# Patient Record
Sex: Male | Born: 1965 | Race: White | Hispanic: No | Marital: Single | Smoking: Never smoker
Health system: Southern US, Community
[De-identification: ages and names within clinical notes are randomized; demographics above are authoritative.]

## PROBLEM LIST (undated history)

## (undated) DIAGNOSIS — E119 Type 2 diabetes mellitus without complications: Secondary | ICD-10-CM

## (undated) DIAGNOSIS — F32A Depression, unspecified: Secondary | ICD-10-CM

## (undated) DIAGNOSIS — G40909 Epilepsy, unspecified, not intractable, without status epilepticus: Secondary | ICD-10-CM

## (undated) DIAGNOSIS — I10 Essential (primary) hypertension: Secondary | ICD-10-CM

## (undated) DIAGNOSIS — R270 Ataxia, unspecified: Secondary | ICD-10-CM

## (undated) DIAGNOSIS — F329 Major depressive disorder, single episode, unspecified: Secondary | ICD-10-CM

## (undated) DIAGNOSIS — J302 Other seasonal allergic rhinitis: Secondary | ICD-10-CM

## (undated) DIAGNOSIS — T190XXA Foreign body in urethra, initial encounter: Secondary | ICD-10-CM

## (undated) DIAGNOSIS — IMO0002 Reserved for concepts with insufficient information to code with codable children: Secondary | ICD-10-CM

## (undated) DIAGNOSIS — N390 Urinary tract infection, site not specified: Secondary | ICD-10-CM

## (undated) DIAGNOSIS — F89 Unspecified disorder of psychological development: Secondary | ICD-10-CM

## (undated) DIAGNOSIS — Q541 Hypospadias, penile: Secondary | ICD-10-CM

## (undated) HISTORY — DX: Other seasonal allergic rhinitis: J30.2

## (undated) HISTORY — PX: INGUINAL HERNIA REPAIR: SHX194

## (undated) HISTORY — DX: Hypospadias, penile: Q54.1

## (undated) HISTORY — DX: Unspecified disorder of psychological development: F89

## (undated) HISTORY — DX: Major depressive disorder, single episode, unspecified: F32.9

## (undated) HISTORY — DX: Ataxia, unspecified: R27.0

## (undated) HISTORY — DX: Type 2 diabetes mellitus without complications: E11.9

## (undated) HISTORY — DX: Reserved for concepts with insufficient information to code with codable children: IMO0002

## (undated) HISTORY — DX: Depression, unspecified: F32.A

## (undated) HISTORY — DX: Essential (primary) hypertension: I10

## (undated) HISTORY — DX: Urinary tract infection, site not specified: N39.0

## (undated) HISTORY — DX: Epilepsy, unspecified, not intractable, without status epilepticus: G40.909

## (undated) HISTORY — DX: Foreign body in urethra, initial encounter: T19.0XXA

---

## 1998-01-05 ENCOUNTER — Encounter (HOSPITAL_COMMUNITY): Admission: RE | Admit: 1998-01-05 | Discharge: 1998-04-05 | Payer: Self-pay | Admitting: Dentistry

## 1998-07-24 ENCOUNTER — Encounter (HOSPITAL_COMMUNITY): Admission: RE | Admit: 1998-07-24 | Discharge: 1998-10-22 | Payer: Self-pay | Admitting: Dentistry

## 2000-03-09 ENCOUNTER — Encounter (HOSPITAL_COMMUNITY): Admission: RE | Admit: 2000-03-09 | Discharge: 2000-06-07 | Payer: Self-pay | Admitting: Dentistry

## 2000-11-03 ENCOUNTER — Encounter (HOSPITAL_COMMUNITY): Admission: RE | Admit: 2000-11-03 | Discharge: 2001-02-01 | Payer: Self-pay | Admitting: Dentistry

## 2001-02-14 ENCOUNTER — Encounter (HOSPITAL_COMMUNITY): Admission: RE | Admit: 2001-02-14 | Discharge: 2001-05-15 | Payer: Self-pay | Admitting: Dentistry

## 2001-06-15 ENCOUNTER — Encounter (HOSPITAL_COMMUNITY): Admission: RE | Admit: 2001-06-15 | Discharge: 2001-09-13 | Payer: Self-pay | Admitting: Dentistry

## 2002-08-08 ENCOUNTER — Encounter: Admission: EM | Admit: 2002-08-08 | Discharge: 2002-08-08 | Payer: Self-pay | Admitting: Dentistry

## 2004-02-17 ENCOUNTER — Ambulatory Visit: Payer: Self-pay | Admitting: Gastroenterology

## 2004-02-27 ENCOUNTER — Ambulatory Visit: Payer: Self-pay | Admitting: Dentistry

## 2004-09-24 ENCOUNTER — Ambulatory Visit: Payer: Self-pay | Admitting: Dentistry

## 2004-11-01 ENCOUNTER — Ambulatory Visit: Payer: Self-pay | Admitting: Dentistry

## 2005-04-08 ENCOUNTER — Ambulatory Visit: Payer: Self-pay | Admitting: Dentistry

## 2005-06-08 ENCOUNTER — Ambulatory Visit: Payer: Self-pay | Admitting: Dentistry

## 2005-10-28 ENCOUNTER — Ambulatory Visit: Payer: Self-pay | Admitting: Dentistry

## 2006-01-19 ENCOUNTER — Inpatient Hospital Stay: Payer: Self-pay | Admitting: Internal Medicine

## 2006-05-11 ENCOUNTER — Emergency Department: Payer: Self-pay | Admitting: Unknown Physician Specialty

## 2006-06-15 ENCOUNTER — Emergency Department: Payer: Self-pay | Admitting: Emergency Medicine

## 2006-06-22 ENCOUNTER — Emergency Department: Payer: Self-pay | Admitting: Emergency Medicine

## 2006-06-23 ENCOUNTER — Ambulatory Visit: Payer: Self-pay | Admitting: Dentistry

## 2006-07-12 ENCOUNTER — Ambulatory Visit: Payer: Self-pay | Admitting: Urology

## 2006-08-03 ENCOUNTER — Ambulatory Visit: Payer: Self-pay | Admitting: Dentistry

## 2006-08-19 ENCOUNTER — Emergency Department: Payer: Self-pay | Admitting: Emergency Medicine

## 2006-08-19 ENCOUNTER — Other Ambulatory Visit: Payer: Self-pay

## 2007-01-19 ENCOUNTER — Ambulatory Visit: Payer: Self-pay | Admitting: Dentistry

## 2007-07-17 ENCOUNTER — Ambulatory Visit: Payer: Self-pay | Admitting: Dentistry

## 2007-08-26 ENCOUNTER — Emergency Department: Payer: Self-pay | Admitting: Emergency Medicine

## 2007-10-05 ENCOUNTER — Emergency Department: Payer: Self-pay | Admitting: Emergency Medicine

## 2008-02-05 ENCOUNTER — Ambulatory Visit: Payer: Self-pay | Admitting: Dentistry

## 2008-02-29 ENCOUNTER — Emergency Department: Payer: Self-pay | Admitting: Emergency Medicine

## 2008-08-29 ENCOUNTER — Ambulatory Visit: Payer: Self-pay | Admitting: Unknown Physician Specialty

## 2008-09-08 ENCOUNTER — Ambulatory Visit: Payer: Self-pay

## 2008-09-10 ENCOUNTER — Ambulatory Visit: Payer: Self-pay | Admitting: Dentistry

## 2008-10-17 ENCOUNTER — Ambulatory Visit: Payer: Self-pay | Admitting: Surgery

## 2008-10-24 ENCOUNTER — Ambulatory Visit: Payer: Self-pay | Admitting: Surgery

## 2009-04-01 ENCOUNTER — Ambulatory Visit: Payer: Self-pay | Admitting: Dentistry

## 2009-09-21 ENCOUNTER — Emergency Department: Payer: Self-pay | Admitting: Emergency Medicine

## 2009-11-03 ENCOUNTER — Ambulatory Visit: Payer: Self-pay | Admitting: Dentistry

## 2009-12-31 ENCOUNTER — Ambulatory Visit: Payer: Self-pay | Admitting: Urology

## 2010-07-27 DIAGNOSIS — K0401 Reversible pulpitis: Secondary | ICD-10-CM

## 2010-07-27 DIAGNOSIS — K029 Dental caries, unspecified: Secondary | ICD-10-CM

## 2010-08-24 ENCOUNTER — Ambulatory Visit: Payer: Self-pay | Admitting: Neurology

## 2010-08-25 ENCOUNTER — Ambulatory Visit: Payer: Self-pay | Admitting: Neurology

## 2010-08-27 ENCOUNTER — Ambulatory Visit: Payer: Self-pay | Admitting: Neurology

## 2010-09-13 DIAGNOSIS — K08409 Partial loss of teeth, unspecified cause, unspecified class: Secondary | ICD-10-CM

## 2010-09-13 DIAGNOSIS — K08109 Complete loss of teeth, unspecified cause, unspecified class: Secondary | ICD-10-CM

## 2010-09-13 DIAGNOSIS — K0820 Unspecified atrophy of edentulous alveolar ridge: Secondary | ICD-10-CM

## 2010-10-04 DIAGNOSIS — K08109 Complete loss of teeth, unspecified cause, unspecified class: Secondary | ICD-10-CM

## 2010-10-04 DIAGNOSIS — K08409 Partial loss of teeth, unspecified cause, unspecified class: Secondary | ICD-10-CM

## 2010-10-04 DIAGNOSIS — K117 Disturbances of salivary secretion: Secondary | ICD-10-CM

## 2010-10-13 ENCOUNTER — Encounter (HOSPITAL_COMMUNITY): Payer: Self-pay | Admitting: Dentistry

## 2010-10-13 DIAGNOSIS — K08409 Partial loss of teeth, unspecified cause, unspecified class: Secondary | ICD-10-CM

## 2010-10-13 DIAGNOSIS — K08109 Complete loss of teeth, unspecified cause, unspecified class: Secondary | ICD-10-CM

## 2010-10-17 ENCOUNTER — Inpatient Hospital Stay: Payer: Self-pay | Admitting: Internal Medicine

## 2010-11-10 ENCOUNTER — Encounter (HOSPITAL_COMMUNITY): Payer: Self-pay | Admitting: Dentistry

## 2010-11-10 DIAGNOSIS — G40909 Epilepsy, unspecified, not intractable, without status epilepticus: Secondary | ICD-10-CM

## 2010-11-10 DIAGNOSIS — F8189 Other developmental disorders of scholastic skills: Secondary | ICD-10-CM

## 2010-11-16 ENCOUNTER — Encounter (HOSPITAL_COMMUNITY): Payer: Self-pay | Admitting: Dentistry

## 2010-11-16 DIAGNOSIS — K1370 Unspecified lesions of oral mucosa: Secondary | ICD-10-CM

## 2010-12-01 DIAGNOSIS — K1370 Unspecified lesions of oral mucosa: Secondary | ICD-10-CM

## 2010-12-15 ENCOUNTER — Encounter (HOSPITAL_COMMUNITY): Payer: Self-pay | Admitting: Dentistry

## 2010-12-15 DIAGNOSIS — K5289 Other specified noninfective gastroenteritis and colitis: Secondary | ICD-10-CM

## 2011-03-08 ENCOUNTER — Encounter (HOSPITAL_COMMUNITY): Payer: Self-pay | Admitting: Dentistry

## 2011-03-08 ENCOUNTER — Ambulatory Visit (HOSPITAL_COMMUNITY): Payer: Medicaid - Dental | Admitting: Dentistry

## 2011-03-08 VITALS — BP 104/72 | HR 83 | Temp 97.0°F

## 2011-03-08 DIAGNOSIS — K036 Deposits [accretions] on teeth: Secondary | ICD-10-CM

## 2011-03-08 DIAGNOSIS — Z463 Encounter for fitting and adjustment of dental prosthetic device: Secondary | ICD-10-CM

## 2011-03-08 DIAGNOSIS — K0530 Chronic periodontitis, unspecified: Secondary | ICD-10-CM

## 2011-03-08 DIAGNOSIS — K056 Periodontal disease, unspecified: Secondary | ICD-10-CM

## 2011-03-08 DIAGNOSIS — K117 Disturbances of salivary secretion: Secondary | ICD-10-CM

## 2011-03-08 NOTE — Progress Notes (Signed)
BP 104/72  Pulse 83  Temp(Src) 97 F (36.1 C) (Oral)  Ricard Faulkner is a 45 year old male that presents for periodic oral exam. Premedication: None required Medical Hx Update:  Past Medical History  Diagnosis Date  . Developmental disability   . Seizure disorder   . Urogenital disorder     Multiple surgeries; H/O Undescended teticle; nocturnal enuresis  . Ataxia     Demyelinating motor neuropathy  . Hypertension   . Diabetes mellitus   . Depression     History of depression  . Seasonal allergies   . ALLERGIES/ADVERSE DRUG REACTIONS: Allergies  Allergen Reactions  . Zithromax (Azithromycin)    MEDICATIONS: Current outpatient prescriptions:baclofen (LIORESAL) 10 MG tablet, Take 10 mg by mouth 3 (three) times daily.  , Disp: , Rfl: ;  divalproex (DEPAKOTE) 500 MG DR tablet, Take 500 mg by mouth 2 (two) times daily.  , Disp: , Rfl: ;  ferrous sulfate 325 (65 FE) MG EC tablet, Take 325 mg by mouth daily.  , Disp: , Rfl: ;  loratadine (CLARITIN) 10 MG tablet, Take 10 mg by mouth daily.  , Disp: , Rfl:  paliperidone (INVEGA) 3 MG 24 hr tablet, Take 3 mg by mouth every morning.  , Disp: , Rfl: ;  PARoxetine (PAXIL-CR) 12.5 MG 24 hr tablet, Take 12.5 mg by mouth every morning.  , Disp: , Rfl:   C/C: Patient presents for periodic oral examination and a dental cleaning. HPI:  Patient last seen for insertion of mandibular partial denture. Patient now presents for every 6 month exam and cleaning as indicated. Patient denies any acute dental problems.  DENTAL EXAM: General: Patient is a developmentally disabled male in no acute distress. Vitals: As above. Extraoral Exam: Is no palpable lymphadenopathy. There are no TMJ symptoms. Intraoral  Exam: Patient has excessive saliva. There are no Soft tissue lesions. Dentition: Patient with tooth numbers 18 1930 and 31 remaining. Caries: No dental caries noted today. Endodontic: No acute pulpitits symptoms.  C&B: Patient has crown the tooth  numbers 19 and 30 with no obvious problems. Prosthodontic: Patient has an upper complete denture which is less than ideal with retention stability. Been able a partial dentures acceptable. Father is interested in possible new upper complete denture at this time. We'll need to obtain prior approval from [facility] before this procedure can be done. Occlusion: Poor occlusal scheme of a stable occlusion. Radiographic Interpretation: No radiographs taken today  Procedures: 1. Periodic oral exam 2. Adult prophylaxis with sonic scaler and selective hand curettes. [place]. Application of fluoride varnish. 3. Provided a prescription with instructions on use of super poligrip denture powder adhesive.  Plan:  1. Will proceed with dental cleaning today. 2. We'll proceed with obtaining prior approval from [facility] for a new upper complete denture. 3. Will call patient for new appointment once prior approval has been received. 4. Call if problems arise before next scheduled appointment.    Charlynne Pander 03/08/2011

## 2011-04-07 ENCOUNTER — Ambulatory Visit: Payer: Self-pay | Admitting: Unknown Physician Specialty

## 2011-05-10 ENCOUNTER — Ambulatory Visit (HOSPITAL_COMMUNITY): Payer: Medicaid - Dental | Admitting: Dentistry

## 2011-05-10 VITALS — BP 102/67 | HR 86 | Temp 97.4°F

## 2011-05-10 DIAGNOSIS — K000 Anodontia: Secondary | ICD-10-CM

## 2011-05-10 DIAGNOSIS — Z463 Encounter for fitting and adjustment of dental prosthetic device: Secondary | ICD-10-CM

## 2011-05-10 DIAGNOSIS — K0820 Unspecified atrophy of edentulous alveolar ridge: Secondary | ICD-10-CM

## 2011-05-10 DIAGNOSIS — K117 Disturbances of salivary secretion: Secondary | ICD-10-CM

## 2011-05-10 DIAGNOSIS — K08109 Complete loss of teeth, unspecified cause, unspecified class: Secondary | ICD-10-CM

## 2011-05-10 NOTE — Progress Notes (Signed)
Tuesday, May 10, 2011   BP: 102/67             P:  86             T: 97.0   Daniel Mitchell presents for start of upper complete denture. Exam: Patient has an edentulous maxilla and lower partial denture. 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  Discussed procedures involved in upper complete denture with father and caregiver and the prognosis for successful ability to wear dentures. Price for denture confirmed. Patient and father agree to proceed with upper complete denture fabrication. Procedures: Upper complete primary impressions in alginate. Pick up impression of lower partial. Lab pour. To [facility] for upper complete custom tray fabrication. RTC for upper denture final impression.  Charlynne Pander, DDS

## 2011-05-18 ENCOUNTER — Ambulatory Visit (HOSPITAL_COMMUNITY): Payer: Medicaid - Dental | Admitting: Dentistry

## 2011-05-18 DIAGNOSIS — K117 Disturbances of salivary secretion: Secondary | ICD-10-CM

## 2011-05-18 DIAGNOSIS — K000 Anodontia: Secondary | ICD-10-CM

## 2011-05-18 NOTE — Progress Notes (Signed)
Wednesday, May 18, 2011  BP: 108/74              P: 93            T: 98.6  [name] presents for continued upper complete denture fabrication. Procedure: Upper complete denture border molding and final impression in [place]. Difficult secondary to copious saliva. Patient tolerated procedure well. To [facility] for custom baseplate with rim. Return to clinic for upper complete denture jaw relations on 1/31.13 at 11:00am.   Dr. Cindra Eves

## 2011-05-26 ENCOUNTER — Ambulatory Visit (HOSPITAL_COMMUNITY): Payer: Medicaid - Dental | Admitting: Dentistry

## 2011-05-26 VITALS — BP 116/64 | HR 70 | Temp 97.6°F

## 2011-05-26 DIAGNOSIS — K08109 Complete loss of teeth, unspecified cause, unspecified class: Secondary | ICD-10-CM

## 2011-05-26 DIAGNOSIS — Z463 Encounter for fitting and adjustment of dental prosthetic device: Secondary | ICD-10-CM

## 2011-05-26 DIAGNOSIS — M264 Malocclusion, unspecified: Secondary | ICD-10-CM

## 2011-05-26 DIAGNOSIS — K000 Anodontia: Secondary | ICD-10-CM

## 2011-05-26 DIAGNOSIS — K117 Disturbances of salivary secretion: Secondary | ICD-10-CM

## 2011-05-26 NOTE — Progress Notes (Signed)
Thursday, May 26, 2011   BP: 116/64              P:  70              T: 97.6   Daniel Mitchell presents for continued upper complete denture fabrication. Procedure: Upper complete denture Jaw relations with bite registration. Difficult procedure secondary to jaw size discrepancy and excessive saliva. Tooth selection as before from previous upper denture. Portrait A2 shade.  Discussed matching upper denture to existing lower partial denture with Father and caregiver.  Discussed trying posterior crossbite for upper set up versus maxillary posterior setup from previous denture. Patient tolerated procedure well. Return to clinic for upper complete denture wax tryin.   Dr. Cindra Eves

## 2011-06-09 ENCOUNTER — Ambulatory Visit (HOSPITAL_COMMUNITY): Payer: Medicaid - Dental | Admitting: Dentistry

## 2011-06-09 VITALS — BP 121/86 | HR 101 | Temp 97.5°F

## 2011-06-09 DIAGNOSIS — Z463 Encounter for fitting and adjustment of dental prosthetic device: Secondary | ICD-10-CM

## 2011-06-09 DIAGNOSIS — K08199 Complete loss of teeth due to other specified cause, unspecified class: Secondary | ICD-10-CM

## 2011-06-09 DIAGNOSIS — M264 Malocclusion, unspecified: Secondary | ICD-10-CM

## 2011-06-09 NOTE — Progress Notes (Signed)
Thursday, June 09, 2011   BP: 121/86           P: 101            T: 97.5   Daniel Mitchell presents for continued upper complete denture fabrication. Procedure: Upper denture wax tryin. Patient and Father accept esthetics, phonetics, fit and function. Patient and father agrees to process "as is" in [place] [address]. Pt. to return to clinic for upper complete denture insertion.   Dr. Cindra Eves

## 2011-06-22 ENCOUNTER — Ambulatory Visit (HOSPITAL_COMMUNITY): Payer: Medicaid - Dental | Admitting: Dentistry

## 2011-06-22 ENCOUNTER — Encounter (HOSPITAL_COMMUNITY): Payer: Self-pay | Admitting: Dentistry

## 2011-06-22 VITALS — BP 106/74 | HR 76 | Temp 97.0°F

## 2011-06-22 DIAGNOSIS — K000 Anodontia: Secondary | ICD-10-CM

## 2011-06-22 DIAGNOSIS — K1370 Unspecified lesions of oral mucosa: Secondary | ICD-10-CM

## 2011-06-22 DIAGNOSIS — Z463 Encounter for fitting and adjustment of dental prosthetic device: Secondary | ICD-10-CM

## 2011-06-22 DIAGNOSIS — K117 Disturbances of salivary secretion: Secondary | ICD-10-CM

## 2011-06-22 DIAGNOSIS — M264 Malocclusion, unspecified: Secondary | ICD-10-CM

## 2011-06-22 NOTE — Progress Notes (Signed)
Wednesday, June 22, 2011   BP: 106/74               P: 76                T:97.0  [name] presents for insertion of upper complete denture. Procedure: Pressure indicating paste applied to upper denture. Adjustments made as needed. [place]. Occlusion evaluated and adjustments made as needed for centric relation and protrusive strokes. Good esthetics, phonetics, fit, and function noted. Patient and Mother accept results. Postop instructions were provided in a written and verbal format on the use and care of the denture. Patient to keep denture out if sore spots develop. Use salt water rinses as needed to aid healing. RTC as scheduled for denture adjustment. Call if problems arise before then. Pt. dismissed in stable condition.   Dr. Cindra Eves

## 2011-06-22 NOTE — Patient Instructions (Signed)
Instructions for Denture Use and Care  Congratulations, you are on the way to oral rehabilitation!  You have just received a new set of complete or partial dentures.  These prostheses will help to improve both your appearance and chewing ability.  These instructions will help you get adjusted to your dentures as well as care for them properly.  Please read these instructions carefully and completely as soon as you get home.  If you or your caregiver have any questions please notify the [facility] at [phone].  HOW YOUR DENTURES LOOK AND FEEL Soon after you begin wearing your dentures, you may feel that your dentures are too large or even loose.  As our mouth and facial muscles become accustomed to the dentures, these feelings will go away.  You also may feel that you are salivating more than you normally do.  This feeling should go away as you get used to having the dentures in your mouth.  You may bite your cheek or your tongue; this will eventually resolve itself as you wear your dentures.  Some soreness is to be expected, but you should not hurt.  If your mouth hurts, call your dentist.  A denture adhesive may occasionally be necessary to hold your dentures in place more securely.  The dentist will let you know when one is recommended for you.  SPEAKING Wearing dentures will change the sound of your voice initially.  This will be noticed by you more than anyone else.  Bite and swallow before you speak, in order to place your dentures in position so that you may speak more clearly.  Practice speaking by reading aloud or counting from 1 to 100 very slowly and distinctly.  After some practice your mouth will become accustomed to your dentures and you will speak more clearly.  EATING Chewing will definitely be different after you receive your dentures.  With a little practice and patience you should be able to eat just about any kind of food.  Begin by eating small quantities of food  that are cut into small pieces.  Star with soft foods such as eggs, cooked vegetables, or puddings.  As you gain confidence advance  Your diet to whatever texture foods you can tolerate.  DENTURE CARE Dentures can collect plaque and calculus much the same as natural teeth can.  If not removed on a regular basis, your dentures will not look or feel clean, and you will experience denture odor.  It is very important that you remove your dentures at bedtime and clean them thoroughly.  You should: 1. Clean your dentures over a sink full of water so if dropped, breakage will be prevented. 2. Rinse your dentures with cool water to remove any large food particles. 3. Use soap and water or a denture cleanser or paste to clean the dentures.  Do not use regular toothpaste as it may abrade the denture base or teeth. 4. Use a moistened denture brush to clean all surfaces (inside and outside). 5. Rinse thoroughly to remove any remaining soap or denture cleanser. 6. Use a soft bristle toothbrush to gently brush any natural teeth, gums, tongue, and palate at bedtime and before reinserting your dentures. 7. Do not sleep with your dentures in your mouth at night.  Remove your dentures and soak them overnight in a denture cup filled with water or denture solution as recommended by your dentist.  This routine will become second nature and will increase the life and comfort   of your dentures.  Please do not try to adjust these dentures yourself; you could damage them.  FOLLOW-UP You should call or make an appointment with your dentist.  Your dentist would like to see you at least once a year for a check-up and examination. 

## 2011-06-27 ENCOUNTER — Encounter (HOSPITAL_COMMUNITY): Payer: Self-pay | Admitting: Dentistry

## 2011-06-27 ENCOUNTER — Ambulatory Visit (HOSPITAL_COMMUNITY): Payer: Medicaid - Dental | Admitting: Dentistry

## 2011-06-27 VITALS — BP 98/64 | HR 63 | Temp 97.6°F

## 2011-06-27 DIAGNOSIS — K1370 Unspecified lesions of oral mucosa: Secondary | ICD-10-CM

## 2011-06-27 DIAGNOSIS — K117 Disturbances of salivary secretion: Secondary | ICD-10-CM

## 2011-06-27 DIAGNOSIS — K08109 Complete loss of teeth, unspecified cause, unspecified class: Secondary | ICD-10-CM

## 2011-06-27 DIAGNOSIS — K062 Gingival and edentulous alveolar ridge lesions associated with trauma: Secondary | ICD-10-CM

## 2011-06-27 DIAGNOSIS — K000 Anodontia: Secondary | ICD-10-CM

## 2011-06-27 NOTE — Progress Notes (Signed)
Monday, June 27, 2011  BP: 96/64            P: 63                T: 97.6  Daniel Mitchell presents for evaluation of recently inserted upper complete denture. Subjective: Patient had been complaining to his caregivers of upper left denture irritation over the weekend. Exam: No sign of denture irritation or erythema. Sialorrhea as before. Excessive denture adhesive in upper denture today. Instructions provided to caregivers on lessening amount of adhesive placed into dentures.  Procedure: Pressure indicating paste applied to upper denture. Adjustments made as needed. [place]. Occlusion evaluated and adjustments made as needed for centric relation. Patient accepts results and denies any problems with his denture today. Patient to leet caregivers or parents know if denture problems arise. Patient to keep denture out if sore spots develop. Use salt water rinses as needed to aid healing. RTC as scheduled for denture adjustment. Call if problems arise before then. Patient dismissed in stable condition.  Dr. Cindra Eves

## 2011-09-06 ENCOUNTER — Ambulatory Visit (HOSPITAL_COMMUNITY): Payer: Medicaid - Dental | Admitting: Dentistry

## 2011-09-06 ENCOUNTER — Encounter (HOSPITAL_COMMUNITY): Payer: Self-pay | Admitting: Dentistry

## 2011-09-06 DIAGNOSIS — K0530 Chronic periodontitis, unspecified: Secondary | ICD-10-CM

## 2011-09-06 DIAGNOSIS — Z463 Encounter for fitting and adjustment of dental prosthetic device: Secondary | ICD-10-CM

## 2011-09-06 DIAGNOSIS — K1370 Unspecified lesions of oral mucosa: Secondary | ICD-10-CM

## 2011-09-06 DIAGNOSIS — K062 Gingival and edentulous alveolar ridge lesions associated with trauma: Secondary | ICD-10-CM

## 2011-09-06 DIAGNOSIS — K036 Deposits [accretions] on teeth: Secondary | ICD-10-CM

## 2011-09-06 DIAGNOSIS — K08109 Complete loss of teeth, unspecified cause, unspecified class: Secondary | ICD-10-CM

## 2011-09-06 NOTE — Progress Notes (Signed)
09/06/2011  Patient:            Daniel Mitchell Date of Birth:  25-Aug-1965 MRN:                [id number]  BP 106/70  Pulse 70  Temp(Src) 97.4 F (36.3 C) (Oral)  [name] is a 46 year old male that presents for periodic oral exam and cleaning. Premedication: None required  Medical Hx Update:  Past Medical History  Diagnosis Date  . Developmental disability   . Seizure disorder   . Urogenital disorder     Multiple surgeries; H/O Undescended teticle; nocturnal enuresis  . Ataxia     Demyelinating motor neuropathy  . Hypertension   . Diabetes mellitus   . Depression     History of depression  . Seasonal allergies   . ALLERGIES/ADVERSE DRUG REACTIONS: Allergies  Allergen Reactions  . Zithromax (Azithromycin)    MEDICATIONS: Current Outpatient Prescriptions  Medication Sig Dispense Refill  . baclofen (LIORESAL) 10 MG tablet Take 10 mg by mouth 3 (three) times daily.        . divalproex (DEPAKOTE) 500 MG DR tablet Take 500 mg by mouth. Take one tablet every morning and two tablets every evening      . ferrous sulfate 325 (65 FE) MG EC tablet Take 325 mg by mouth daily.        Marland Kitchen nystatin-triamcinolone (MYCOLOG II) cream Apply 1 application topically 3 (three) times daily.      . paliperidone (INVEGA) 3 MG 24 hr tablet Take 3 mg by mouth every morning.        Marland Kitchen PARoxetine (PAXIL-CR) 12.5 MG 24 hr tablet Take 12.5 mg by mouth every morning.        . loratadine (CLARITIN) 10 MG tablet Take 10 mg by mouth daily.         C/C: Patient presents for periodic oral examination and a dental cleaning. Patient is complaining of non-specific discomfort to lower right molar.  HPI:  Patient was last seen for and adjustment of an upper complete denture and lower partial denture.  Patient reporting having some discomfort with the lower right molar proximal 0.2 weeks ago. There is no spontaneous pain. There is no history of acute pulpitis symptoms. Patient indicates that there is  some tenderness with removing the lower denture.  DENTAL EXAM: General: Patient is a developmentally disabled male in no acute distress. Extraoral Exam: There is NO palpable lymphadenopathy. There are no TMJ symptoms. Intraoral  Exam: Patient has excessive saliva. There is some denture irritation note to the lingual aspect #20/21 area. Dentition: Patient with tooth numbers 18, 19, 30, 31 remaining. Periodontal: Patient has chronic periodontitis with plaque and calculus accumulations, generalized gingival recession and no obvious tooth mobility. Patient has increased gingival recession on the lingual aspect of tooth #30.  Oral hygiene instructions were provided on keeping this area clean. Adult prophylaxis provided with a sonic scaler and selective hand curettes. Areas were polished. Fluoride varnish was applied to the remaining teeth. Caries: No dental caries noted today. Endodontic: No acute pulpitits symptoms. Periapical radiographs taken of #30/31. No obvious periapical pathology noted. Previous Root canal therapy #30 noted. C&B: Patient has crown the tooth numbers 19 and 30 with no obvious problems. Prosthodontic: Patient has an upper complete denture and a lower Flexite mandibular partial denture.  Pressure indicating paste was applied to dentures and adjustments made as needed. Dentures were polished appropriately. No significant problems were associated with the lower  right molar #30/31 area from the denture.  Occlusion: The occlusion of the dentures appears to be acceptable at this time. Radiographic Interpretation:  Periapical radiograph #30/31 taken. No obvious radiolucency noted. Previos root canal therapy #30 noted. No caries noted.  Assessments: 1. Chronic periodontitis 2. Accretions 3. Denture irritation 4. Gingival recession especially around lingual of #30 5. Acceptable upper complete and lower partial dentures.  Procedures: 1. Periodic oral exam 2. PA #30/31 with no  obvious periapical pathology 3. Adult prophylaxis with sonic scaler and selective hand curettes. [place]. Application of fluoride varnish. 4. Adjustment of upper complete and lower partial dentures. [place].   Plan:  1. Patient and cargivers are to brush teeth at least twice daily with fluoride toothpaste. Dentures are to be cleaned with soap and water and then soaked overnight with the appropriate denture cleanser.     Patient's mouth is to be cleaned to remove residual denture adhesive at night. 2. Return to clinic for periodic oral examination and dental cleaning in 6 months.  3. Caregiver is to call if problems arise before the next scheduled appointment.   Charlynne Pander, DDS

## 2012-01-23 ENCOUNTER — Emergency Department: Payer: Self-pay

## 2012-01-23 LAB — URINALYSIS, COMPLETE
Bilirubin,UR: NEGATIVE
Glucose,UR: NEGATIVE mg/dL (ref 0–75)
Ph: 7 (ref 4.5–8.0)
RBC,UR: 3 /HPF (ref 0–5)
Squamous Epithelial: 2
WBC UR: 16 /HPF (ref 0–5)

## 2012-01-23 LAB — COMPREHENSIVE METABOLIC PANEL
Albumin: 3.6 g/dL (ref 3.4–5.0)
Alkaline Phosphatase: 61 U/L (ref 50–136)
Anion Gap: 10 (ref 7–16)
BUN: 8 mg/dL (ref 7–18)
Calcium, Total: 9.5 mg/dL (ref 8.5–10.1)
Co2: 29 mmol/L (ref 21–32)
Creatinine: 0.82 mg/dL (ref 0.60–1.30)
EGFR (African American): >60
Glucose: 125 mg/dL — ABNORMAL HIGH (ref 65–99)
Potassium: 4.3 mmol/L (ref 3.5–5.1)
SGOT(AST): 36 U/L (ref 15–37)
SGPT (ALT): 49 U/L (ref 12–78)
Sodium: 140 mmol/L (ref 136–145)
Total Protein: 7.5 g/dL (ref 6.4–8.2)

## 2012-01-23 LAB — CBC
HCT: 41.2 % (ref 40.0–52.0)
MCH: 31.5 pg (ref 26.0–34.0)
MCHC: 33.6 g/dL (ref 32.0–36.0)
MCV: 94 fL (ref 80–100)
Platelet: 155 10*3/uL (ref 150–440)
RBC: 4.4 10*6/uL (ref 4.40–5.90)
WBC: 6.8 10*3/uL (ref 3.8–10.6)

## 2012-03-13 ENCOUNTER — Ambulatory Visit (HOSPITAL_COMMUNITY): Payer: Medicaid - Dental | Admitting: Dentistry

## 2012-03-13 ENCOUNTER — Encounter (HOSPITAL_COMMUNITY): Payer: Self-pay | Admitting: Dentistry

## 2012-03-13 VITALS — BP 91/62 | HR 78 | Temp 98.4°F

## 2012-03-13 DIAGNOSIS — IMO0002 Reserved for concepts with insufficient information to code with codable children: Secondary | ICD-10-CM

## 2012-03-13 DIAGNOSIS — K036 Deposits [accretions] on teeth: Secondary | ICD-10-CM

## 2012-03-13 DIAGNOSIS — K0530 Chronic periodontitis, unspecified: Secondary | ICD-10-CM

## 2012-03-13 DIAGNOSIS — K0820 Unspecified atrophy of edentulous alveolar ridge: Secondary | ICD-10-CM

## 2012-03-13 DIAGNOSIS — K117 Disturbances of salivary secretion: Secondary | ICD-10-CM

## 2012-03-13 NOTE — Patient Instructions (Addendum)
   Plan:  1. Patient and cargivers are to brush teeth at least twice daily with fluoride toothpaste. Dentures are to be cleaned with soap and water and then soaked overnight with the appropriate denture cleanser.     Patient's mouth is to be cleaned to remove residual denture adhesive at night. 2. Return to clinic for periodic oral examination and dental cleaning in 6 months.  3. Caregiver is to call if problems arise before the next scheduled appointment.

## 2012-03-13 NOTE — Progress Notes (Signed)
03/13/2012  Patient:            Daniel Mitchell Date of Birth:  Mar 29, 1966 MRN:                [id number]  BP 91/62  Pulse 78  Temp 98.4 F (36.9 C) (Oral)  [name] is a 46 year old male that presents for periodic oral exam and cleaning. Premedication: None required  Medical Hx Update:  Past Medical History  Diagnosis Date  . Developmental disability   . Seizure disorder   . Urogenital disorder     Multiple surgeries; H/O Undescended teticle; nocturnal enuresis  . Ataxia     Demyelinating motor neuropathy  . Hypertension   . Diabetes mellitus   . Depression     History of depression  . Seasonal allergies   . ALLERGIES/ADVERSE DRUG REACTIONS: Allergies  Allergen Reactions  . Zithromax (Azithromycin)    MEDICATIONS: Current Outpatient Prescriptions  Medication Sig Dispense Refill  . baclofen (LIORESAL) 10 MG tablet Take 10 mg by mouth 3 (three) times daily.        . divalproex (DEPAKOTE) 500 MG DR tablet Take 500 mg by mouth. Take one tablet every morning and two tablets every evening      . ferrous sulfate 325 (65 FE) MG EC tablet Take 325 mg by mouth daily.        Marland Kitchen loratadine (CLARITIN) 10 MG tablet Take 10 mg by mouth daily as needed.       . nystatin-triamcinolone (MYCOLOG II) cream Apply 1 application topically 3 (three) times daily.      . paliperidone (INVEGA) 3 MG 24 hr tablet Take 3 mg by mouth every morning.        Marland Kitchen PARoxetine (PAXIL-CR) 12.5 MG 24 hr tablet Take 12.5 mg by mouth every morning.         C/C: Patient presents for periodic oral examination and a dental cleaning.  HPI:  Patient was last seen for exam and cleaning and adjustment of an upper complete denture and lower partial denture in May 2013.  Patient is not complaining of any dental pain. Upper denture is somewhat loose consistent with atrophy of dentulous alveolar ridge.  DENTAL EXAM: General: Patient is a developmentally disabled male in no acute distress. Extraoral Exam:  There is NO palpable lymphadenopathy. There are no TMJ symptoms. Intraoral  Exam: Patient has excessive saliva. There is no denture irritation noted. There is gingival recession associated with lower right molar on lingual #30. Dentition: Patient with tooth numbers 18, 19, 30, 31 remaining. Periodontal: Patient has chronic periodontitis with plaque and calculus accumulations, generalized gingival recession and no obvious tooth mobility. Patient has gingival recession on the lingual aspect of tooth #30.  No sensitivity reported today. Oral hygiene instructions were provided on keeping this area clean. Adult prophylaxis provided with a sonic scaler and selective hand curettes. Areas were polished. Fluoride varnish was applied to the remaining teeth. Caries: No dental caries noted today. Endodontic: No acute pulpitits symptoms.  C&B: Patient has crown the tooth numbers 19 and 30 with no obvious problems. Prosthodontic: Patient has an upper complete denture and a lower Flexite mandibular partial denture.  Lower partial denture was polished. Occlusion: The occlusion of the dentures appears to be acceptable at this time.  Assessments: 1. Chronic periodontitis 2. Accretions 3. Gingival recession especially around lingual of #30 4. Acceptable upper complete and lower partial dentures. 5. Atrophy of edentulous alveolar ridges  Procedures: 1. Periodic oral  exam 2. Adult prophylaxis with sonic scaler and selective hand curettes. [place]. Application of fluoride varnish. 3. Evaluation of upper complete and lower partial dentures. [place].   Plan:  1. Patient and cargivers are to brush teeth at least twice daily with fluoride toothpaste. Dentures are to be cleaned with soap and water and then soaked overnight with the appropriate denture cleanser.     Patient's mouth is to be cleaned to remove residual denture adhesive at night. 2. Return to clinic for periodic oral examination and dental cleaning in 6  months.  3. Caregiver is to call if problems arise before the next scheduled appointment.   Charlynne Pander, DDS

## 2012-08-21 ENCOUNTER — Ambulatory Visit: Payer: Self-pay | Admitting: Neurology

## 2012-09-11 ENCOUNTER — Ambulatory Visit: Payer: Self-pay | Admitting: Physical Medicine and Rehabilitation

## 2012-09-25 ENCOUNTER — Other Ambulatory Visit (HOSPITAL_COMMUNITY): Payer: Self-pay | Admitting: Dentistry

## 2012-09-25 ENCOUNTER — Ambulatory Visit (HOSPITAL_COMMUNITY): Payer: Medicaid - Dental | Admitting: Dentistry

## 2012-09-25 ENCOUNTER — Encounter (HOSPITAL_COMMUNITY): Payer: Self-pay | Admitting: Dentistry

## 2012-09-25 VITALS — BP 110/73 | HR 84 | Temp 97.8°F | Ht 62.0 in | Wt 165.0 lb

## 2012-09-25 DIAGNOSIS — IMO0002 Reserved for concepts with insufficient information to code with codable children: Secondary | ICD-10-CM

## 2012-09-25 DIAGNOSIS — Z98811 Dental restoration status: Secondary | ICD-10-CM

## 2012-09-25 DIAGNOSIS — M264 Malocclusion, unspecified: Secondary | ICD-10-CM

## 2012-09-25 DIAGNOSIS — K0530 Chronic periodontitis, unspecified: Secondary | ICD-10-CM | POA: Insufficient documentation

## 2012-09-25 DIAGNOSIS — K117 Disturbances of salivary secretion: Secondary | ICD-10-CM

## 2012-09-25 DIAGNOSIS — K0820 Unspecified atrophy of edentulous alveolar ridge: Secondary | ICD-10-CM

## 2012-09-25 DIAGNOSIS — K036 Deposits [accretions] on teeth: Secondary | ICD-10-CM

## 2012-09-25 DIAGNOSIS — K045 Chronic apical periodontitis: Secondary | ICD-10-CM

## 2012-09-25 DIAGNOSIS — K08409 Partial loss of teeth, unspecified cause, unspecified class: Secondary | ICD-10-CM

## 2012-09-25 NOTE — Patient Instructions (Addendum)
Plan:  1. Patient and cargivers are to brush teeth at least twice daily with fluoride toothpaste. Dentures are to be cleaned with soap and water and then soaked overnight with the appropriate denture cleanser.     Patient's mouth is to be cleaned to remove residual denture adhesive at night. 2. Return to clinic for periodic oral examination and dental cleaning in 6 months.  3. Caregiver is to call if problems arise before the next scheduled appointment.    Charlynne Pander, DDS [phone]

## 2012-09-25 NOTE — Progress Notes (Addendum)
09/25/2012  Patient:            Navdeep Halt Date of Birth:  10/23/1965 MRN:                [id number]  BP 110/73  Pulse 84  Temp(Src) 97.8 F (36.6 C) (Oral)  Ht 5\' 2"  (1.575 m)  Wt 165 lb (74.844 kg)  BMI 30.17 kg/m2  Zohair Brain Stavely is a 47 year old male that presents for periodic oral exam and cleaning. Premedication: None required  Medical Hx Update:  Past Medical History  Diagnosis Date  . Developmental disability   . Seizure disorder   . Urogenital disorder     Multiple surgeries; H/O Undescended teticle; nocturnal enuresis  . Ataxia     Demyelinating motor neuropathy  . Hypertension   . Diabetes mellitus   . Depression     History of depression  . Seasonal allergies   . ALLERGIES/ADVERSE DRUG REACTIONS: Allergies  Allergen Reactions  . Zithromax (Azithromycin)    MEDICATIONS: Current Outpatient Prescriptions  Medication Sig Dispense Refill  . baclofen (LIORESAL) 10 MG tablet Take 10 mg by mouth 3 (three) times daily.        . clotrimazole (LOTRIMIN) 1 % cream Apply topically. Apply to feet once daily as needed      . divalproex (DEPAKOTE) 500 MG DR tablet Take 500 mg by mouth. Take 3 tablets every morning and two tablets every evening      . ferrous sulfate 325 (65 FE) MG EC tablet Take 325 mg by mouth daily.        Marland Kitchen loratadine (CLARITIN) 10 MG tablet Take 10 mg by mouth daily as needed.       . nystatin-triamcinolone (MYCOLOG II) cream Apply 1 application topically 2 (two) times daily.       . Olopatadine HCl (PATANASE) 0.6 % SOLN Place 2 puffs into the nose. Place 2 sprays in each nostril twice daily for nasal drainage      . paliperidone (INVEGA) 3 MG 24 hr tablet Take 3 mg by mouth every morning.        Marland Kitchen PARoxetine (PAXIL-CR) 12.5 MG 24 hr tablet Take 12.5 mg by mouth every morning.        . pravastatin (PRAVACHOL) 20 MG tablet Take 20 mg by mouth daily.      . risperiDONE (RISPERDAL M-TABS) 1 MG disintegrating tablet Take 1 mg by mouth. Take 1 tab  in dissolve under tongue as needed for agitation. May repeat and 20 minutes per protocols.       No current facility-administered medications for this visit.   C/C: Patient presents for periodic oral examination and a dental cleaning.  HPI:  Patient was last seen for exam and cleaning and adjustment of an upper complete denture and lower partial denture in May 2013.  Patient is not complaining of any dental pain. Upper denture is somewhat loose consistent with atrophy of edentulous alveolar ridge.  DENTAL EXAM: General: Patient is a developmentally disabled male in no acute distress. Extraoral Exam: There is NO palpable lymphadenopathy. There are no TMJ symptoms. Intraoral  Exam: Patient has excessive saliva. There is no denture irritation noted. There is gingival recession associated with lower right molar on lingual #30. Dentition: Patient with tooth numbers 18, 19, 30, 31 remaining. Periodontal: Patient has chronic periodontitis with minimal plaque and calculus accumulations, generalized gingival recession. Patient now has incipeint tooth mobility of tooth #'s 19 and 30. Patient has gingival recession on  the lingual aspect of tooth #30.  No sensitivity reported today. Oral hygiene is generally very good. Adult prophylaxis provided with a sonic scaler and selective hand curettes. Areas were polished. Fluoride varnish was applied to the remaining teeth. Caries: No dental caries noted today. Endodontic: No acute pulpitits symptoms.  C&B: Patient has crown the tooth numbers 19 and 30 with no obvious problems. Prosthodontic: Patient has an upper complete denture and a lower Flexite mandibular partial denture. Lower partial denture was polished. Occlusion: The occlusion of the dentures appears to be acceptable at this time.  Assessments: 1. Chronic periodontitis 2. Accretions-minimal 3. Gingival recession especially around lingual of #30 4. Acceptable upper complete and lower partial  dentures. 5. Atrophy of edentulous alveolar ridges  Procedures: 1. Periodic oral exam 2. Adult prophylaxis with sonic scaler and selective hand curettes. [place]. Application of fluoride varnish. 3. Evaluation of upper complete and lower partial dentures. [place].   Plan:  1. Patient and cargivers are to brush teeth at least twice daily with fluoride toothpaste. Dentures are to be cleaned with soap and water and then soaked overnight with the appropriate denture cleanser.     Patient's mouth is to be cleaned to remove residual denture adhesive at night. 2. Return to clinic for periodic oral examination and dental cleaning in 6 months.  3. Caregiver is to call if problems arise before the next scheduled appointment.   Charlynne Pander, DDS

## 2012-09-25 NOTE — Progress Notes (Signed)
This encounter was created in error - please disregard.

## 2013-04-02 ENCOUNTER — Encounter (HOSPITAL_COMMUNITY): Payer: Self-pay | Admitting: Dentistry

## 2013-04-29 ENCOUNTER — Encounter (HOSPITAL_COMMUNITY): Payer: Self-pay | Admitting: Dentistry

## 2013-05-27 ENCOUNTER — Encounter (HOSPITAL_COMMUNITY): Payer: Self-pay | Admitting: Dentistry

## 2013-06-04 ENCOUNTER — Encounter (INDEPENDENT_AMBULATORY_CARE_PROVIDER_SITE_OTHER): Payer: Self-pay

## 2013-06-04 ENCOUNTER — Ambulatory Visit (HOSPITAL_COMMUNITY): Payer: Medicaid - Dental | Admitting: Dentistry

## 2013-06-04 ENCOUNTER — Encounter (HOSPITAL_COMMUNITY): Payer: Self-pay | Admitting: Dentistry

## 2013-06-04 VITALS — BP 110/76 | HR 75 | Temp 97.4°F

## 2013-06-04 DIAGNOSIS — K117 Disturbances of salivary secretion: Secondary | ICD-10-CM

## 2013-06-04 DIAGNOSIS — K0820 Unspecified atrophy of edentulous alveolar ridge: Secondary | ICD-10-CM

## 2013-06-04 DIAGNOSIS — IMO0002 Reserved for concepts with insufficient information to code with codable children: Secondary | ICD-10-CM

## 2013-06-04 DIAGNOSIS — K08409 Partial loss of teeth, unspecified cause, unspecified class: Secondary | ICD-10-CM

## 2013-06-04 DIAGNOSIS — K0530 Chronic periodontitis, unspecified: Secondary | ICD-10-CM

## 2013-06-04 DIAGNOSIS — K036 Deposits [accretions] on teeth: Secondary | ICD-10-CM

## 2013-06-04 DIAGNOSIS — M264 Malocclusion, unspecified: Secondary | ICD-10-CM

## 2013-06-04 DIAGNOSIS — Z972 Presence of dental prosthetic device (complete) (partial): Secondary | ICD-10-CM

## 2013-06-04 DIAGNOSIS — K08109 Complete loss of teeth, unspecified cause, unspecified class: Secondary | ICD-10-CM

## 2013-06-04 NOTE — Patient Instructions (Signed)
Plan:  1. Patient and cargivers are to continue to brush teeth at least twice daily with fluoride toothpaste. Dentures are to be cleaned with soap and water and then soaked overnight with the appropriate denture cleanser.     Patient's mouth is to be cleaned to remove residual denture adhesive at night. 2. Return to clinic for periodic oral examination and dental cleaning in 6 months.  3. Caregiver is to call if problems arise before the next scheduled appointment. Dr. Kristin BruinsKulinski

## 2013-06-04 NOTE — Progress Notes (Signed)
06/04/2013  Patient:            Daniel BustardCharles Eakins Date of Birth:  05/09/1965 MRN:                [id number]  BP 110/76  Pulse 75  Temp(Src) 97.4 F (36.3 C) (Oral)  Ammaar Brain Duffy RhodyGaither is a 48 year old male that presents for periodic oral exam and cleaning. Premedication: None required  Medical Hx Update:  Past Medical History  Diagnosis Date  . Developmental disability   . Seizure disorder   . Urogenital disorder     Multiple surgeries; H/O Undescended teticle; nocturnal enuresis  . Ataxia     Demyelinating motor neuropathy  . Hypertension   . Diabetes mellitus   . Depression     History of depression  . Seasonal allergies   . ALLERGIES/ADVERSE DRUG REACTIONS: Allergies  Allergen Reactions  . Zithromax (Azithromycin)    MEDICATIONS: Current Outpatient Prescriptions  Medication Sig Dispense Refill  . baclofen (LIORESAL) 10 MG tablet Take 10 mg by mouth 3 (three) times daily.        . clotrimazole (LOTRIMIN) 1 % cream Apply topically. Apply to feet once daily as needed      . divalproex (DEPAKOTE) 500 MG DR tablet Take 500 mg by mouth. Take 2 tablets every morning and two tablets every evening      . escitalopram (LEXAPRO) 20 MG tablet Take 20 mg by mouth daily.      . ferrous sulfate 325 (65 FE) MG EC tablet Take 325 mg by mouth daily.        Marland Kitchen. loratadine (CLARITIN) 10 MG tablet Take 10 mg by mouth daily as needed.       . nystatin-triamcinolone (MYCOLOG II) cream Apply 1 application topically 2 (two) times daily.       . Olopatadine HCl (PATANASE) 0.6 % SOLN Place 2 puffs into the nose. Place 2 sprays in each nostril twice daily for nasal drainage      . paliperidone (INVEGA) 3 MG 24 hr tablet Take 3 mg by mouth every morning.        . pravastatin (PRAVACHOL) 20 MG tablet Take 20 mg by mouth daily.      . risperiDONE (RISPERDAL M-TABS) 1 MG disintegrating tablet Take 1 mg by mouth. Take 1 tab in dissolve under tongue as needed for agitation. May repeat and 20 minutes per  protocols.       No current facility-administered medications for this visit.   C/C: Patient presents for periodic oral examination and a dental cleaning.  HPI:  Patient was last seen for exam and cleaning and adjustment of an upper complete denture and lower partial denture in June 2014.  Patient is not complaining of any dental pain. Upper denture is somewhat loose consistent with atrophy of edentulous alveolar ridge.  DENTAL EXAM: General: Patient is a developmentally disabled male in no acute distress. Extraoral Exam: There is NO palpable lymphadenopathy. There are no TMJ symptoms. Intraoral  Exam: Patient has excessive saliva. There is no denture irritation noted. There is gingival recession associated with lower right molar on lingual #30. PATIENT HAVE VERY GOOD ORAL HYGIENE. Dentition: Patient with tooth numbers 18, 19, 30, 31 remaining. Periodontal: Patient has chronic periodontitis with minimal plaque accumulations, generalized gingival recession. Patient now has incipeint tooth mobility of tooth #'s 19 and 30. Patient has gingival recession on the lingual aspect of tooth #30.   No sensitivity reported today. Oral hygiene is generally very  good. Adult prophylaxis provided with a sonic scaler and selective hand curettes. Areas were polished. Caries: No dental caries noted today. Endodontic: No acute pulpitits symptoms.  C&B: Patient has crown the tooth numbers 19 and 30 with no obvious problems. Prosthodontic: Patient has an upper complete denture and a lower Flexite mandibular partial denture. DENTURES ARE CLEANED ACCEPTABLY. Occlusion: The occlusion of the dentures appears to be acceptable at this time. Bilateral mandibular posterior crossbite as noted as before to  Assessments: 1. Chronic periodontitis 2. Accretions-minimal 3. Gingival recession especially around lingual of #30 4. Atrophy of edentulous alveolar ridges 5. Acceptable upper complete and lower partial  dentures.   Procedures: 1. Periodic oral exam 2. Adult prophylaxis with sonic scaler and selective hand curettes. [place]. 3. Evaluation of upper complete and lower partial dentures. [place].   Plan:  1. Patient and cargivers are to continue to brush teeth at least twice daily with fluoride toothpaste. Dentures are to be cleaned with soap and water and then soaked overnight with the appropriate denture cleanser.     Patient's mouth is to be cleaned to remove residual denture adhesive at night. 2. Return to clinic for periodic oral examination and dental cleaning in 6 months.  3. Caregiver is to call if problems arise before the next scheduled appointment.   Charlynne Pander, DDS

## 2013-10-26 DIAGNOSIS — E118 Type 2 diabetes mellitus with unspecified complications: Secondary | ICD-10-CM | POA: Diagnosis present

## 2013-10-26 DIAGNOSIS — E119 Type 2 diabetes mellitus without complications: Secondary | ICD-10-CM

## 2013-11-05 DIAGNOSIS — R569 Unspecified convulsions: Secondary | ICD-10-CM

## 2013-12-03 ENCOUNTER — Ambulatory Visit (HOSPITAL_COMMUNITY): Payer: Self-pay | Admitting: Dentistry

## 2013-12-11 ENCOUNTER — Encounter (HOSPITAL_COMMUNITY): Payer: Self-pay | Admitting: Dentistry

## 2013-12-11 ENCOUNTER — Encounter (INDEPENDENT_AMBULATORY_CARE_PROVIDER_SITE_OTHER): Payer: Self-pay

## 2013-12-11 ENCOUNTER — Ambulatory Visit (HOSPITAL_COMMUNITY): Payer: Medicaid - Dental | Admitting: Dentistry

## 2013-12-11 VITALS — BP 115/65 | HR 66 | Temp 98.1°F

## 2013-12-11 DIAGNOSIS — K0530 Chronic periodontitis, unspecified: Secondary | ICD-10-CM

## 2013-12-11 DIAGNOSIS — K036 Deposits [accretions] on teeth: Secondary | ICD-10-CM

## 2013-12-11 DIAGNOSIS — K08109 Complete loss of teeth, unspecified cause, unspecified class: Secondary | ICD-10-CM

## 2013-12-11 DIAGNOSIS — K08409 Partial loss of teeth, unspecified cause, unspecified class: Secondary | ICD-10-CM

## 2013-12-11 DIAGNOSIS — Z972 Presence of dental prosthetic device (complete) (partial): Secondary | ICD-10-CM

## 2013-12-11 NOTE — Progress Notes (Signed)
12/11/2013  Patient:            Daniel Mitchell Date of Birth:  05/30/1965 MRN:                [id number]  BP 115/65  Pulse 66  Temp(Src) 98.1 F (36.7 C) (Oral)   Daniel Mitchell is a 48 year old male that presents for periodic oral exam and cleaning. Premedication: None required  Medical Hx Update:  Past Medical History  Diagnosis Date  . Developmental disability   . Seizure disorder   . Urogenital disorder     Multiple surgeries; H/O Undescended teticle; nocturnal enuresis  . Ataxia     Demyelinating motor neuropathy  . Hypertension   . Diabetes mellitus   . Depression     History of depression  . Seasonal allergies   . ALLERGIES/ADVERSE DRUG REACTIONS: Allergies  Allergen Reactions  . Zithromax (Azithromycin)    MEDICATIONS: Current Outpatient Prescriptions  Medication Sig Dispense Refill  . baclofen (LIORESAL) 10 MG tablet Take 10 mg by mouth 3 (three) times daily.        . clotrimazole (LOTRIMIN) 1 % cream Apply topically. Apply to feet once daily as needed      . divalproex (DEPAKOTE) 500 MG DR tablet Take 500 mg by mouth. Take 2 tablets every morning and two tablets every evening      . escitalopram (LEXAPRO) 20 MG tablet Take 20 mg by mouth daily.      . ferrous sulfate 325 (65 FE) MG EC tablet Take 325 mg by mouth daily.        Marland Kitchen. loratadine (CLARITIN) 10 MG tablet Take 10 mg by mouth daily as needed.       . nystatin-triamcinolone (MYCOLOG II) cream Apply 1 application topically 2 (two) times daily.       . Olopatadine HCl (PATANASE) 0.6 % SOLN Place 2 puffs into the nose. Place 2 sprays in each nostril twice daily for nasal drainage      . paliperidone (INVEGA) 3 MG 24 hr tablet Take 3 mg by mouth every morning.        . pravastatin (PRAVACHOL) 20 MG tablet Take 20 mg by mouth daily.      . risperiDONE (RISPERDAL M-TABS) 1 MG disintegrating tablet Take 1 mg by mouth. Take 1 tab in dissolve under tongue as needed for agitation. May repeat and 20  minutes per protocols.       No current facility-administered medications for this visit.   C/C: Patient presents for periodic oral examination and a dental cleaning.  HPI:  Patient was last seen for exam and cleaning and adjustment of an upper complete denture and lower partial denture on June 04, 2013. Patient is not complaining of any dental pain. Upper denture is somewhat loose consistent with atrophy of edentulous alveolar ridge as before.  DENTAL EXAM: General: Patient is a developmentally disabled male in no acute distress. The patient wants to the aid of a walker. Extraoral Exam: There is NO palpable lymphadenopathy. There are no TMJ symptoms. Intraoral  Exam: Patient has excessive saliva. There is no denture irritation noted. There is gingival recession associated with lower right molar on lingual #30. PATIENT HAS VERY GOOD ORAL HYGIENE. Dentition: Patient with tooth numbers 18, 19, 30, 31 remaining. Periodontal: Patient has chronic periodontitis with minimal plaque accumulations, generalized gingival recession. Patient now has incipeint tooth mobility of tooth #'s 19 and 30. Patient has gingival recession on the lingual aspect of  tooth #30.   No sensitivity reported today. Oral hygiene is generally very good. Adult prophylaxis provided with a sonic scaler and selective hand curettes. Teeth were polished. Fluoride varnish applied. Caries: No dental caries noted today. Endodontic: No acute pulpitits symptoms.  C&B: Patient has crown the tooth numbers 19 and 30 with no obvious problems. Prosthodontic: Patient has an upper complete denture and a lower Flexite mandibular partial denture. DENTURES ARE CLEANED ACCEPTABLY. Occlusion: The occlusion of the dentures appears to be acceptable at this time. Bilateral mandibular posterior crossbite as noted before.  Assessments: 1. Chronic periodontitis 2. Accretions-minimal 3. Gingival recession especially around lingual of #30 4.  Atrophy of edentulous alveolar ridges 5. Acceptable upper complete and lower Flexite partial dentures.   Procedures: 1. Periodic oral exam 2. Adult prophylaxis with sonic scaler and selective hand curettes. [place]. 3. Evaluation of upper complete and lower partial dentures. [place].   Plan:  1. Patient and cargivers are to continue to brush teeth at least twice daily with fluoride toothpaste. Dentures are to be cleaned with soap and water and then soaked overnight with the appropriate denture cleanser.  Patient's mouth is to be cleaned to remove residual denture adhesive at night. 2. Return to clinic for periodic oral examination and dental cleaning in 6 months.  3. Caregiver is to call if problems arise before the next scheduled appointment.   Charlynne Pander, DDS

## 2013-12-11 NOTE — Patient Instructions (Signed)
Plan:  1. Patient and cargivers are to continue to brush teeth at least twice daily with fluoride toothpaste. Dentures are to be cleaned with soap and water and then soaked overnight with the appropriate denture cleanser.  Patient's mouth is to be cleaned to remove residual denture adhesive at night. 2. Return to clinic for periodic oral examination and dental cleaning in 6 months.  3. Caregiver is to call if problems arise before the next scheduled appointment.   Charlynne Panderonald F. Kamiyah Kindel, DDS

## 2014-01-11 ENCOUNTER — Inpatient Hospital Stay: Payer: Self-pay | Admitting: Internal Medicine

## 2014-01-11 LAB — CK-MB
CK-MB: 56.7 ng/mL — ABNORMAL HIGH (ref 0.5–3.6)
CK-MB: 38 ng/mL — AB (ref 0.5–3.6)

## 2014-01-11 LAB — URINALYSIS, COMPLETE
BILIRUBIN, UR: NEGATIVE
Bacteria: NONE SEEN
Glucose,UR: >=500 mg/dL (ref 0–75)
NITRITE: NEGATIVE
Ph: 5 (ref 4.5–8.0)
Protein: 100
RBC,UR: 4 /HPF (ref 0–5)
Specific Gravity: 1.033 (ref 1.003–1.030)
Squamous Epithelial: 2

## 2014-01-11 LAB — COMPREHENSIVE METABOLIC PANEL
ALT: 107 U/L — AB
Albumin: 2.8 g/dL — ABNORMAL LOW (ref 3.4–5.0)
Alkaline Phosphatase: 38 U/L — ABNORMAL LOW
Anion Gap: 10 (ref 7–16)
BUN: 10 mg/dL (ref 7–18)
Bilirubin,Total: 0.4 mg/dL (ref 0.2–1.0)
CHLORIDE: 104 mmol/L (ref 98–107)
CO2: 27 mmol/L (ref 21–32)
Calcium, Total: 8.8 mg/dL (ref 8.5–10.1)
Creatinine: 0.89 mg/dL (ref 0.60–1.30)
EGFR (African American): >60
Glucose: 139 mg/dL — ABNORMAL HIGH (ref 65–99)
Osmolality: 283 (ref 275–301)
Potassium: 3.9 mmol/L (ref 3.5–5.1)
SGOT(AST): 526 U/L — ABNORMAL HIGH (ref 15–37)
Sodium: 141 mmol/L (ref 136–145)
Total Protein: 6.2 g/dL — ABNORMAL LOW (ref 6.4–8.2)

## 2014-01-11 LAB — PRO B NATRIURETIC PEPTIDE: B-TYPE NATIURETIC PEPTID: 719 pg/mL — AB (ref 0–125)

## 2014-01-11 LAB — CBC
HCT: 39.7 % — ABNORMAL LOW (ref 40.0–52.0)
HGB: 13.1 g/dL (ref 13.0–18.0)
MCH: 32.4 pg (ref 26.0–34.0)
MCHC: 33.1 g/dL (ref 32.0–36.0)
MCV: 98 fL (ref 80–100)
Platelet: 124 10*3/uL — ABNORMAL LOW (ref 150–440)
RBC: 4.04 10*6/uL — ABNORMAL LOW (ref 4.40–5.90)
RDW: 13.6 % (ref 11.5–14.5)
WBC: 8.5 10*3/uL (ref 3.8–10.6)

## 2014-01-11 LAB — TROPONIN I
TROPONIN-I: 0.18 ng/mL — AB
TROPONIN-I: 0.15 ng/mL — AB
Troponin-I: 0.19 ng/mL — ABNORMAL HIGH

## 2014-01-11 LAB — CK TOTAL AND CKMB (NOT AT ARMC)
CK, Total: 16324 U/L — ABNORMAL HIGH
CK-MB: 91.5 ng/mL — ABNORMAL HIGH (ref 0.5–3.6)

## 2014-01-11 LAB — VALPROIC ACID LEVEL: Valproic Acid: 137 ug/mL — ABNORMAL HIGH

## 2014-01-12 LAB — CBC WITH DIFFERENTIAL/PLATELET
BASOS ABS: 0 10*3/uL (ref 0.0–0.1)
Basophil %: 0.7 %
Eosinophil #: 0 10*3/uL (ref 0.0–0.7)
Eosinophil %: 0.6 %
HCT: 33.8 % — ABNORMAL LOW (ref 40.0–52.0)
HGB: 10.9 g/dL — ABNORMAL LOW (ref 13.0–18.0)
Lymphocyte #: 1.9 10*3/uL (ref 1.0–3.6)
Lymphocyte %: 36 %
MCH: 32.1 pg (ref 26.0–34.0)
MCHC: 32.4 g/dL (ref 32.0–36.0)
MCV: 99 fL (ref 80–100)
Monocyte #: 0.7 x10 3/mm (ref 0.2–1.0)
Monocyte %: 14.5 %
NEUTROS ABS: 2.5 10*3/uL (ref 1.4–6.5)
Neutrophil %: 48.2 %
PLATELETS: 96 10*3/uL — AB (ref 150–440)
RBC: 3.41 10*6/uL — ABNORMAL LOW (ref 4.40–5.90)
RDW: 14 % (ref 11.5–14.5)
WBC: 5.1 10*3/uL (ref 3.8–10.6)

## 2014-01-12 LAB — COMPREHENSIVE METABOLIC PANEL
ALBUMIN: 2.2 g/dL — AB (ref 3.4–5.0)
AST: 311 U/L — AB (ref 15–37)
Alkaline Phosphatase: 30 U/L — ABNORMAL LOW
Anion Gap: 7 (ref 7–16)
BUN: 6 mg/dL — ABNORMAL LOW (ref 7–18)
Bilirubin,Total: 0.4 mg/dL (ref 0.2–1.0)
CALCIUM: 7.8 mg/dL — AB (ref 8.5–10.1)
CHLORIDE: 110 mmol/L — AB (ref 98–107)
CO2: 27 mmol/L (ref 21–32)
Creatinine: 0.66 mg/dL (ref 0.60–1.30)
EGFR (African American): >60
EGFR (Non-African Amer.): >60
Glucose: 110 mg/dL — ABNORMAL HIGH (ref 65–99)
Osmolality: 285 (ref 275–301)
Potassium: 3.8 mmol/L (ref 3.5–5.1)
SGPT (ALT): 82 U/L — ABNORMAL HIGH
Sodium: 144 mmol/L (ref 136–145)
TOTAL PROTEIN: 4.8 g/dL — AB (ref 6.4–8.2)

## 2014-01-13 LAB — COMPREHENSIVE METABOLIC PANEL
AST: 175 U/L — AB (ref 15–37)
Albumin: 2.1 g/dL — ABNORMAL LOW (ref 3.4–5.0)
Alkaline Phosphatase: 32 U/L — ABNORMAL LOW
Anion Gap: 7 (ref 7–16)
BUN: 6 mg/dL — ABNORMAL LOW (ref 7–18)
Bilirubin,Total: 0.3 mg/dL (ref 0.2–1.0)
Calcium, Total: 8 mg/dL — ABNORMAL LOW (ref 8.5–10.1)
Chloride: 111 mmol/L — ABNORMAL HIGH (ref 98–107)
Co2: 25 mmol/L (ref 21–32)
Creatinine: 0.68 mg/dL (ref 0.60–1.30)
EGFR (African American): >60
EGFR (Non-African Amer.): >60
GLUCOSE: 115 mg/dL — AB (ref 65–99)
OSMOLALITY: 284 (ref 275–301)
POTASSIUM: 4.2 mmol/L (ref 3.5–5.1)
SGPT (ALT): 70 U/L — ABNORMAL HIGH
SODIUM: 143 mmol/L (ref 136–145)
Total Protein: 4.5 g/dL — ABNORMAL LOW (ref 6.4–8.2)

## 2014-01-13 LAB — CBC WITH DIFFERENTIAL/PLATELET
BASOS PCT: 0.3 %
Basophil #: 0 10*3/uL (ref 0.0–0.1)
EOS PCT: 1.2 %
Eosinophil #: 0.1 10*3/uL (ref 0.0–0.7)
HCT: 34.5 % — ABNORMAL LOW (ref 40.0–52.0)
HGB: 11.5 g/dL — ABNORMAL LOW (ref 13.0–18.0)
LYMPHS ABS: 1.8 10*3/uL (ref 1.0–3.6)
Lymphocyte %: 32.8 %
MCH: 32.8 pg (ref 26.0–34.0)
MCHC: 33.4 g/dL (ref 32.0–36.0)
MCV: 98 fL (ref 80–100)
MONO ABS: 0.8 x10 3/mm (ref 0.2–1.0)
MONOS PCT: 14.5 %
NEUTROS ABS: 2.8 10*3/uL (ref 1.4–6.5)
Neutrophil %: 51.2 %
Platelet: 97 10*3/uL — ABNORMAL LOW (ref 150–440)
RBC: 3.51 10*6/uL — AB (ref 4.40–5.90)
RDW: 13.7 % (ref 11.5–14.5)
WBC: 5.4 10*3/uL (ref 3.8–10.6)

## 2014-01-13 LAB — TROPONIN I: TROPONIN-I: 0.03 ng/mL

## 2014-01-13 LAB — CK TOTAL AND CKMB (NOT AT ARMC)
CK, TOTAL: 3074 U/L — AB
CK-MB: 6.9 ng/mL — AB (ref 0.5–3.6)

## 2014-01-13 LAB — VALPROIC ACID LEVEL: Valproic Acid: 53 ug/mL

## 2014-01-14 LAB — URINE CULTURE

## 2014-02-22 ENCOUNTER — Inpatient Hospital Stay: Payer: Self-pay | Admitting: Internal Medicine

## 2014-02-22 LAB — URINALYSIS, COMPLETE
Bacteria: NONE SEEN
Bilirubin,UR: NEGATIVE
NITRITE: NEGATIVE
Ph: 5 (ref 4.5–8.0)
Protein: 100
RBC,UR: 383 /HPF (ref 0–5)
Specific Gravity: 1.025 (ref 1.003–1.030)

## 2014-02-22 LAB — COMPREHENSIVE METABOLIC PANEL
Albumin: 2.8 g/dL — ABNORMAL LOW (ref 3.4–5.0)
Alkaline Phosphatase: 45 U/L — ABNORMAL LOW
Anion Gap: 10 (ref 7–16)
BILIRUBIN TOTAL: 0.4 mg/dL (ref 0.2–1.0)
BUN: 9 mg/dL (ref 7–18)
CHLORIDE: 104 mmol/L (ref 98–107)
Calcium, Total: 8.8 mg/dL (ref 8.5–10.1)
Co2: 26 mmol/L (ref 21–32)
Creatinine: 1.03 mg/dL (ref 0.60–1.30)
EGFR (African American): >60
EGFR (Non-African Amer.): >60
GLUCOSE: 161 mg/dL — AB (ref 65–99)
OSMOLALITY: 282 (ref 275–301)
Potassium: 4.1 mmol/L (ref 3.5–5.1)
SGOT(AST): 20 U/L (ref 15–37)
SGPT (ALT): 19 U/L
SODIUM: 140 mmol/L (ref 136–145)
Total Protein: 6.3 g/dL — ABNORMAL LOW (ref 6.4–8.2)

## 2014-02-22 LAB — CBC
HCT: 38.8 % — ABNORMAL LOW (ref 40.0–52.0)
HGB: 12.6 g/dL — AB (ref 13.0–18.0)
MCH: 32 pg (ref 26.0–34.0)
MCHC: 32.5 g/dL (ref 32.0–36.0)
MCV: 98 fL (ref 80–100)
PLATELETS: 102 10*3/uL — AB (ref 150–440)
RBC: 3.95 10*6/uL — ABNORMAL LOW (ref 4.40–5.90)
RDW: 13.7 % (ref 11.5–14.5)
WBC: 9 10*3/uL (ref 3.8–10.6)

## 2014-02-23 LAB — TSH: THYROID STIMULATING HORM: 0.88 u[IU]/mL

## 2014-02-23 LAB — CBC WITH DIFFERENTIAL/PLATELET
BASOS PCT: 0.6 %
Basophil #: 0 10*3/uL (ref 0.0–0.1)
EOS ABS: 0 10*3/uL (ref 0.0–0.7)
EOS PCT: 0.1 %
HCT: 36.1 % — AB (ref 40.0–52.0)
HGB: 11.8 g/dL — ABNORMAL LOW (ref 13.0–18.0)
LYMPHS ABS: 1.5 10*3/uL (ref 1.0–3.6)
Lymphocyte %: 22.9 %
MCH: 32.3 pg (ref 26.0–34.0)
MCHC: 32.8 g/dL (ref 32.0–36.0)
MCV: 98 fL (ref 80–100)
MONOS PCT: 18.8 %
Monocyte #: 1.3 x10 3/mm — ABNORMAL HIGH (ref 0.2–1.0)
NEUTROS ABS: 3.9 10*3/uL (ref 1.4–6.5)
NEUTROS PCT: 57.6 %
PLATELETS: 84 10*3/uL — AB (ref 150–440)
RBC: 3.67 10*6/uL — AB (ref 4.40–5.90)
RDW: 13.7 % (ref 11.5–14.5)
WBC: 6.7 10*3/uL (ref 3.8–10.6)

## 2014-02-23 LAB — BASIC METABOLIC PANEL
ANION GAP: 4 — AB (ref 7–16)
BUN: 10 mg/dL (ref 7–18)
CALCIUM: 8.3 mg/dL — AB (ref 8.5–10.1)
CO2: 30 mmol/L (ref 21–32)
Chloride: 105 mmol/L (ref 98–107)
Creatinine: 0.8 mg/dL (ref 0.60–1.30)
GLUCOSE: 107 mg/dL — AB (ref 65–99)
OSMOLALITY: 277 (ref 275–301)
POTASSIUM: 3.9 mmol/L (ref 3.5–5.1)
SODIUM: 139 mmol/L (ref 136–145)

## 2014-02-23 LAB — URINE CULTURE

## 2014-03-28 DIAGNOSIS — Q541 Hypospadias, penile: Secondary | ICD-10-CM

## 2014-03-28 HISTORY — DX: Hypospadias, penile: Q54.1

## 2014-04-09 HISTORY — PX: CYSTOSCOPY: SUR368

## 2014-04-10 ENCOUNTER — Inpatient Hospital Stay: Payer: Self-pay | Admitting: Internal Medicine

## 2014-04-10 LAB — COMPREHENSIVE METABOLIC PANEL
ALBUMIN: 2.4 g/dL — AB (ref 3.4–5.0)
ALT: 17 U/L
Alkaline Phosphatase: 36 U/L — ABNORMAL LOW
Anion Gap: 7 (ref 7–16)
BUN: 12 mg/dL (ref 7–18)
Bilirubin,Total: 0.4 mg/dL (ref 0.2–1.0)
CHLORIDE: 109 mmol/L — AB (ref 98–107)
CREATININE: 0.98 mg/dL (ref 0.60–1.30)
Calcium, Total: 8 mg/dL — ABNORMAL LOW (ref 8.5–10.1)
Co2: 25 mmol/L (ref 21–32)
EGFR (Non-African Amer.): >60
Glucose: 144 mg/dL — ABNORMAL HIGH (ref 65–99)
OSMOLALITY: 284 (ref 275–301)
Potassium: 4.6 mmol/L (ref 3.5–5.1)
SGOT(AST): 18 U/L (ref 15–37)
Sodium: 141 mmol/L (ref 136–145)
Total Protein: 5.9 g/dL — ABNORMAL LOW (ref 6.4–8.2)

## 2014-04-10 LAB — CBC WITH DIFFERENTIAL/PLATELET
BASOS PCT: 0.3 %
Basophil #: 0 10*3/uL (ref 0.0–0.1)
Eosinophil #: 0 10*3/uL (ref 0.0–0.7)
Eosinophil %: 0.1 %
HCT: 37.6 % — ABNORMAL LOW (ref 40.0–52.0)
HGB: 12.2 g/dL — AB (ref 13.0–18.0)
LYMPHS PCT: 10.8 %
Lymphocyte #: 1 10*3/uL (ref 1.0–3.6)
MCH: 31.8 pg (ref 26.0–34.0)
MCHC: 32.4 g/dL (ref 32.0–36.0)
MCV: 98 fL (ref 80–100)
MONO ABS: 1.5 x10 3/mm — AB (ref 0.2–1.0)
Monocyte %: 16.2 %
NEUTROS PCT: 72.6 %
Neutrophil #: 6.6 10*3/uL — ABNORMAL HIGH (ref 1.4–6.5)
Platelet: 92 10*3/uL — ABNORMAL LOW (ref 150–440)
RBC: 3.82 10*6/uL — ABNORMAL LOW (ref 4.40–5.90)
RDW: 14.2 % (ref 11.5–14.5)
WBC: 9.1 10*3/uL (ref 3.8–10.6)

## 2014-04-10 LAB — URINALYSIS, COMPLETE
BACTERIA: NONE SEEN
Bilirubin,UR: NEGATIVE
Glucose,UR: NEGATIVE mg/dL (ref 0–75)
NITRITE: NEGATIVE
PH: 5 (ref 4.5–8.0)
Specific Gravity: 1.017 (ref 1.003–1.030)
Squamous Epithelial: NONE SEEN

## 2014-04-10 LAB — PHOSPHORUS: PHOSPHORUS: 2.6 mg/dL (ref 2.5–4.9)

## 2014-04-10 LAB — PROTIME-INR
INR: 1
Prothrombin Time: 13.4 secs (ref 11.5–14.7)

## 2014-04-10 LAB — TROPONIN I

## 2014-04-10 LAB — MAGNESIUM: MAGNESIUM: 1.6 mg/dL — AB

## 2014-04-11 LAB — CBC WITH DIFFERENTIAL/PLATELET
BASOS PCT: 0.1 %
Basophil #: 0 10*3/uL (ref 0.0–0.1)
Eosinophil #: 0 10*3/uL (ref 0.0–0.7)
Eosinophil %: 0.2 %
HCT: 33.5 % — AB (ref 40.0–52.0)
HGB: 11 g/dL — AB (ref 13.0–18.0)
Lymphocyte #: 1.6 10*3/uL (ref 1.0–3.6)
Lymphocyte %: 16.8 %
MCH: 32.2 pg (ref 26.0–34.0)
MCHC: 32.8 g/dL (ref 32.0–36.0)
MCV: 98 fL (ref 80–100)
MONOS PCT: 14.5 %
Monocyte #: 1.4 x10 3/mm — ABNORMAL HIGH (ref 0.2–1.0)
Neutrophil #: 6.6 10*3/uL — ABNORMAL HIGH (ref 1.4–6.5)
Neutrophil %: 68.4 %
Platelet: 65 10*3/uL — ABNORMAL LOW (ref 150–440)
RBC: 3.42 10*6/uL — ABNORMAL LOW (ref 4.40–5.90)
RDW: 14.3 % (ref 11.5–14.5)
WBC: 9.6 10*3/uL (ref 3.8–10.6)

## 2014-04-11 LAB — BASIC METABOLIC PANEL
Anion Gap: 7 (ref 7–16)
BUN: 5 mg/dL — ABNORMAL LOW (ref 7–18)
Calcium, Total: 8.3 mg/dL — ABNORMAL LOW (ref 8.5–10.1)
Chloride: 106 mmol/L (ref 98–107)
Co2: 27 mmol/L (ref 21–32)
Creatinine: 0.83 mg/dL (ref 0.60–1.30)
EGFR (African American): >60
GLUCOSE: 124 mg/dL — AB (ref 65–99)
OSMOLALITY: 278 (ref 275–301)
POTASSIUM: 3.9 mmol/L (ref 3.5–5.1)
Sodium: 140 mmol/L (ref 136–145)

## 2014-04-11 LAB — URINE CULTURE

## 2014-04-14 ENCOUNTER — Inpatient Hospital Stay: Payer: Self-pay | Admitting: Internal Medicine

## 2014-04-14 LAB — CBC WITH DIFFERENTIAL/PLATELET
BASOS ABS: 0 10*3/uL (ref 0.0–0.1)
BASOS PCT: 0.4 %
Eosinophil #: 0.1 10*3/uL (ref 0.0–0.7)
Eosinophil %: 0.9 %
HCT: 39.6 % — ABNORMAL LOW (ref 40.0–52.0)
HGB: 12.9 g/dL — AB (ref 13.0–18.0)
Lymphocyte #: 1.6 10*3/uL (ref 1.0–3.6)
Lymphocyte %: 21.4 %
MCH: 31.7 pg (ref 26.0–34.0)
MCHC: 32.7 g/dL (ref 32.0–36.0)
MCV: 97 fL (ref 80–100)
MONOS PCT: 21 %
Monocyte #: 1.6 x10 3/mm — ABNORMAL HIGH (ref 0.2–1.0)
NEUTROS ABS: 4.3 10*3/uL (ref 1.4–6.5)
Neutrophil %: 56.3 %
PLATELETS: 100 10*3/uL — AB (ref 150–440)
RBC: 4.08 10*6/uL — ABNORMAL LOW (ref 4.40–5.90)
RDW: 13.9 % (ref 11.5–14.5)
WBC: 7.7 10*3/uL (ref 3.8–10.6)

## 2014-04-14 LAB — URINALYSIS, COMPLETE
BILIRUBIN, UR: NEGATIVE
Glucose,UR: NEGATIVE mg/dL (ref 0–75)
Nitrite: NEGATIVE
Ph: 5 (ref 4.5–8.0)
RBC,UR: 120 /HPF (ref 0–5)
Specific Gravity: 1.027 (ref 1.003–1.030)
Squamous Epithelial: 2

## 2014-04-14 LAB — COMPREHENSIVE METABOLIC PANEL
ALBUMIN: 2.1 g/dL — AB (ref 3.4–5.0)
ALK PHOS: 45 U/L — AB
ALT: 17 U/L
Anion Gap: 8 (ref 7–16)
BUN: 11 mg/dL (ref 7–18)
Bilirubin,Total: 0.4 mg/dL (ref 0.2–1.0)
Calcium, Total: 8.6 mg/dL (ref 8.5–10.1)
Chloride: 105 mmol/L (ref 98–107)
Co2: 26 mmol/L (ref 21–32)
Creatinine: 0.72 mg/dL (ref 0.60–1.30)
EGFR (African American): >60
EGFR (Non-African Amer.): >60
Glucose: 115 mg/dL — ABNORMAL HIGH (ref 65–99)
OSMOLALITY: 278 (ref 275–301)
Potassium: 4 mmol/L (ref 3.5–5.1)
SGOT(AST): 19 U/L (ref 15–37)
Sodium: 139 mmol/L (ref 136–145)
Total Protein: 6.8 g/dL (ref 6.4–8.2)

## 2014-04-14 LAB — TROPONIN I: Troponin-I: <0.02 ng/mL

## 2014-04-14 LAB — LIPASE, BLOOD: Lipase: 131 U/L (ref 73–393)

## 2014-04-15 LAB — CBC WITH DIFFERENTIAL/PLATELET
BASOS ABS: 0 10*3/uL (ref 0.0–0.1)
Basophil %: 0.2 %
EOS PCT: 1 %
Eosinophil #: 0.1 10*3/uL (ref 0.0–0.7)
HCT: 32.8 % — AB (ref 40.0–52.0)
HGB: 10.8 g/dL — ABNORMAL LOW (ref 13.0–18.0)
LYMPHS ABS: 2.2 10*3/uL (ref 1.0–3.6)
Lymphocyte %: 32.1 %
MCH: 31.9 pg (ref 26.0–34.0)
MCHC: 33 g/dL (ref 32.0–36.0)
MCV: 97 fL (ref 80–100)
MONO ABS: 1.3 x10 3/mm — AB (ref 0.2–1.0)
MONOS PCT: 19.1 %
NEUTROS PCT: 47.6 %
Neutrophil #: 3.3 10*3/uL (ref 1.4–6.5)
Platelet: 89 10*3/uL — ABNORMAL LOW (ref 150–440)
RBC: 3.39 10*6/uL — ABNORMAL LOW (ref 4.40–5.90)
RDW: 14.5 % (ref 11.5–14.5)
WBC: 6.9 10*3/uL (ref 3.8–10.6)

## 2014-04-15 LAB — CULTURE, BLOOD (SINGLE)

## 2014-04-15 LAB — BASIC METABOLIC PANEL
Anion Gap: 5 — ABNORMAL LOW (ref 7–16)
BUN: 12 mg/dL (ref 7–18)
Calcium, Total: 7.8 mg/dL — ABNORMAL LOW (ref 8.5–10.1)
Chloride: 110 mmol/L — ABNORMAL HIGH (ref 98–107)
Co2: 28 mmol/L (ref 21–32)
Creatinine: 0.77 mg/dL (ref 0.60–1.30)
Glucose: 120 mg/dL — ABNORMAL HIGH (ref 65–99)
OSMOLALITY: 286 (ref 275–301)
POTASSIUM: 3.9 mmol/L (ref 3.5–5.1)
SODIUM: 143 mmol/L (ref 136–145)

## 2014-04-16 LAB — BASIC METABOLIC PANEL
Anion Gap: 2 — ABNORMAL LOW (ref 7–16)
BUN: 8 mg/dL (ref 7–18)
CHLORIDE: 108 mmol/L — AB (ref 98–107)
Calcium, Total: 8.3 mg/dL — ABNORMAL LOW (ref 8.5–10.1)
Co2: 28 mmol/L (ref 21–32)
Creatinine: 0.74 mg/dL (ref 0.60–1.30)
Glucose: 98 mg/dL (ref 65–99)
OSMOLALITY: 274 (ref 275–301)
Potassium: 4 mmol/L (ref 3.5–5.1)
Sodium: 138 mmol/L (ref 136–145)

## 2014-04-16 LAB — CBC WITH DIFFERENTIAL/PLATELET
Basophil #: 0 10*3/uL (ref 0.0–0.1)
Basophil %: 0.3 %
Eosinophil #: 0.1 10*3/uL (ref 0.0–0.7)
Eosinophil %: 1.4 %
HCT: 35.2 % — ABNORMAL LOW (ref 40.0–52.0)
HGB: 11.4 g/dL — AB (ref 13.0–18.0)
LYMPHS PCT: 33.5 %
Lymphocyte #: 2.5 10*3/uL (ref 1.0–3.6)
MCH: 31.7 pg (ref 26.0–34.0)
MCHC: 32.5 g/dL (ref 32.0–36.0)
MCV: 98 fL (ref 80–100)
Monocyte #: 1.2 x10 3/mm — ABNORMAL HIGH (ref 0.2–1.0)
Monocyte %: 16.5 %
Neutrophil #: 3.6 10*3/uL (ref 1.4–6.5)
Neutrophil %: 48.3 %
Platelet: 120 10*3/uL — ABNORMAL LOW (ref 150–440)
RBC: 3.61 10*6/uL — AB (ref 4.40–5.90)
RDW: 14.4 % (ref 11.5–14.5)
WBC: 7.4 10*3/uL (ref 3.8–10.6)

## 2014-04-19 LAB — CBC WITH DIFFERENTIAL/PLATELET
BASOS PCT: 0.4 %
Basophil #: 0 10*3/uL (ref 0.0–0.1)
Eosinophil #: 0.2 10*3/uL (ref 0.0–0.7)
Eosinophil %: 2.2 %
HCT: 35.9 % — ABNORMAL LOW (ref 40.0–52.0)
HGB: 11.9 g/dL — AB (ref 13.0–18.0)
LYMPHS ABS: 3.1 10*3/uL (ref 1.0–3.6)
LYMPHS PCT: 39 %
MCH: 31.9 pg (ref 26.0–34.0)
MCHC: 33.2 g/dL (ref 32.0–36.0)
MCV: 96 fL (ref 80–100)
Monocyte #: 1.1 x10 3/mm — ABNORMAL HIGH (ref 0.2–1.0)
Monocyte %: 14 %
Neutrophil #: 3.5 10*3/uL (ref 1.4–6.5)
Neutrophil %: 44.4 %
Platelet: 201 10*3/uL (ref 150–440)
RBC: 3.74 10*6/uL — ABNORMAL LOW (ref 4.40–5.90)
RDW: 14.5 % (ref 11.5–14.5)
WBC: 7.9 10*3/uL (ref 3.8–10.6)

## 2014-04-19 LAB — CULTURE, BLOOD (SINGLE)

## 2014-04-19 LAB — BASIC METABOLIC PANEL
Anion Gap: 6 — ABNORMAL LOW (ref 7–16)
BUN: 7 mg/dL (ref 7–18)
Calcium, Total: 8.6 mg/dL (ref 8.5–10.1)
Chloride: 108 mmol/L — ABNORMAL HIGH (ref 98–107)
Co2: 27 mmol/L (ref 21–32)
Creatinine: 0.77 mg/dL (ref 0.60–1.30)
EGFR (African American): >60
EGFR (Non-African Amer.): >60
Glucose: 116 mg/dL — ABNORMAL HIGH (ref 65–99)
OSMOLALITY: 280 (ref 275–301)
Potassium: 4.3 mmol/L (ref 3.5–5.1)
Sodium: 141 mmol/L (ref 136–145)

## 2014-04-20 LAB — URINE CULTURE

## 2014-06-11 ENCOUNTER — Encounter (HOSPITAL_COMMUNITY): Payer: Self-pay | Admitting: Dentistry

## 2014-06-12 ENCOUNTER — Encounter: Payer: Self-pay | Admitting: Surgery

## 2014-06-24 ENCOUNTER — Encounter: Payer: Self-pay | Admitting: Surgery

## 2014-07-21 ENCOUNTER — Ambulatory Visit: Payer: Self-pay | Admitting: Physician Assistant

## 2014-08-05 DIAGNOSIS — T190XXA Foreign body in urethra, initial encounter: Secondary | ICD-10-CM

## 2014-08-05 HISTORY — DX: Foreign body in urethra, initial encounter: T19.0XXA

## 2014-08-16 NOTE — H&P (Signed)
PATIENT NAME:  Daniel Mitchell, [name] MR#:  [id number] DATE OF BIRTH:  Jan 10, 1966  DATE OF ADMISSION:  04/14/2014  PRIMARY CARE PHYSICIAN: Marya AmslerMarshall W. Dareen PianoAnderson, MD   CHIEF COMPLAINT: Weakness, confusion.   HISTORY OF PRESENTING ILLNESS: A 49 year old Caucasian male patient with history of cognitive impairment, hypospadias with recent urological procedure and discharge from the hospital 04/11/2014 with UTI returns to the Emergency Room with worsening weakness and confusion.   The patient is unable to give any history. History has been obtained from old records, ER staff, and talking to his mother at bedside.   The patient at baseline seems to walk a few feet with his walker, mostly using a wheelchair but does talk to the staff although has significant cognitive problems and speech problems. He is very pleasant. Over the past few days since discharge the patient has been unable to walk, barely talks 1 to 2 words, which is at a very low volume and concerning this was brought to the Emergency Room.   Here, the patient has been found to have a UTI with temperature of 99.9 orally along with mild tachycardia, is being admitted to the hospitalist service for UTI in spite of being on antibiotics with acute encephalopathy and worsening weakness.   The patient recently had surgery for his hypospadias with some laser and lithotripsy by Dr. Virl DiamondStoiff of urology. Later was discharged home on the 17th but returned from his group home to the ER with fever and UTI, was admitted. He was discharged on 04/11/2014 on Levaquin. The patient's mother feels that he was discharged too early.   PAST MEDICAL HISTORY:  1.  Cognitive impairment.  2.  Hypospadias with multiple surgeries.   SOCIAL HISTORY: The patient does smoke. No alcohol. No illicit drugs. Ambulates with a walker at baseline, using a wheelchair. Is a resident of a group home.   CODE STATUS: DNR/DNI.   FAMILY HISTORY: Father had prostate cancer and lung cancer.    ALLERGIES: AZITHROMYCIN.   HOME MEDICATIONS:  1.  Levaquin 500 mg daily started on 04/11/2014.  2.  Baclofen 10 mg 3 times a day.  3.  Clotrimazole topical cream daily as needed.  6.  Escitalopram 20 mg 2 times a day.  7.  Ferrous sulfate 325 mg oral once a day.  8.  Invega 3 mg extended release once in the morning.  9.  Loratadine 10 mg oral once a day.  10.  Nystatin, triamcinolone topical cream daily.  11.  Pravastatin 20 mg daily.  12.  Risperidone 1 mg orally as needed for agitation.   REVIEW OF SYSTEMS: Unobtainable from the patient secondary to his confusion.   PHYSICAL EXAMINATION:  VITAL SIGNS: Temperature 99.9, pulse of 91, blood pressure of 96/50, saturating 96% on room air.  GENERAL: Moderately built Caucasian male patient lying in bed drowsy.  PSYCHIATRIC:  Wakes up on calling his name but drowsy.  HEENT: Atraumatic, normocephalic. Pale. Oral mucosa dry. No thrush.  NECK: Supple. No thyromegaly. No palpable lymph nodes. Trachea midline. No carotid bruit or JVD.  CARDIOVASCULAR: S1, S2, without any murmurs. Peripheral pulses 2+. No edema. RESPIRATORY: Normal work of breathing, clear to auscultation on both sides. GASTROINTESTINAL: Soft abdomen, nontender. Bowel sounds present. No organomegaly palpable. SKIN:  Warm and dry. No petechiae, rash, ulcers.  GENITOURINARY: Has hypospadias.  No rash or redness or swelling found.   MUSCULOSKELETAL:  No joint swelling, redness, effusion of the large joints. Normal muscle tone.  NEUROLOGICAL: Motor strength 4/5 in  upper and lower extremities.   LYMPHATIC: No cervical lymphadenopathy.   LABORATORY STUDIES: , hemoglobin 12.9, platelets of 100, neutrophils 56%.   Urinalysis: Turbid urine with 1700 WBC, 1+ bacteria, WBC clumps and mucus present.   Glucose 115, BUN 11, creatinine 0.72, sodium 139, potassium 4. AST, ALT, alkaline phosphatase, bilirubin normal. Troponin less than 0.02.   Chest x-ray shows linear subsegmental  atelectasis at the left base.   ASSESSMENT AND PLAN:  1.  Urinary tract infection with acute encephalopathy in a patient status post urological procedures. The patient has been on Levaquin and has not responded. Reviewing his old cultures, the patient has had Escherichia coli, which is sensitive to ceftriaxone, which will be started. We will send for urine and blood cultures. The patient does not have fever, normal white count. His infection could have been set off with his lithotripsy and procedures .  At this point wait for cultures. We will put him on IV fluids due to decreased intake and seems dehydrated. Have physical therapy come by and see him.  He may need to go to skilled nursing facility before transitioned to group home and will depend on his progress. I have had a lengthy discussion with his mom explaining that patient will likely feel weak for an extended period of time  from infection and would not be back to baseline at the time of discharge and she has verbalized understanding.   2.  Chronic thrombocytopenia, stable. No bleeding.  3.  Deep vein thrombosis prophylaxis with Lovenox.   CODE STATUS: DNR/DNI.   TIME SPENT TODAY ON THIS CASE: 45 minutes.   ____________________________ Molinda Bailiff Joella Saefong, MD srs:AT D: 04/14/2014 21:52:41 ET T: 04/15/2014 00:07:02 ET JOB#: [id number]  cc: Wardell Heath R. Kaiea Esselman, MD, <Dictator> Marya Amsler. Dareen Piano, MD Orie Fisherman MD ELECTRONICALLY SIGNED 04/16/2014 7:47

## 2014-08-16 NOTE — Consult Note (Signed)
PATIENT NAME:  Daniel Mitchell, Terrall B MR#:  [id number] DATE OF BIRTH:  03/20/1966  DATE OF CONSULTATION:  01/12/2014  REFERRING PHYSICIAN:   Marya AmslerMarshall W. Dareen PianoAnderson, MD CONSULTING PHYSICIAN:  Marcina MillardAlexander Babette Stum, MD  REASON FOR CONSULTATION: Elevated troponin.   CHIEF COMPLAINT: Falling.   HISTORY OF PRESENT ILLNESS: The patient is a 49 year old gentleman with history of mental retardation, currently residing in a group home.  He was admitted after multiple falls. The patient has had a history of hypertension, diabetes, and previous falls with dislocated shoulder. [facility] with recurrent falls. In the Emergency Room, admission labs were notable for a total CPK of 16,324, MB fraction of 56.7 and troponin of 0.8. The patient denies chest pain or shortness of breath. Urinalysis also revealed the presence of white cells consistent with a urinary tract infection. The patient also had an elevated SGPT and SGOT.   PAST MEDICAL HISTORY:  1.  Mental retardation. 2.  Recurrent falls. 3.  Prior shoulder dislocation. 4.  Prior ankle fracture.  5.  Hypertension.  6.  Diabetes.   MEDICATIONS: Pravastatin 20 mg at bedtime, Risperdal 1 mg q. 6 hours p.r.n., Claritin 10 mg daily, Invega 3 mg daily, iron sulfate 325 mg daily, Lexapro 20 mg daily, Depakote 1000 mg b.i.d., baclofen 10 mg t.i.d.  SOCIAL HISTORY: The patient currently is a resident in a group home. He denies tobacco or EtOH abuse.   FAMILY HISTORY: Father is status post coronary artery bypass graft surgery.   REVIEW OF SYSTEMS:   CONSTITUTIONAL:  No fever or chills. EYES: No blurry vision.  EARS: No hearing loss.  RESPIRATORY: No shortness of breath.  CARDIOVASCULAR: No chest pain.  GASTROINTESTINAL: No nausea, vomiting, or diarrhea.  GENITOURINARY: No dysuria or hematuria.  ENDOCRINE: No polyuria or polydipsia.  MUSCULOSKELETAL: No arthralgias or myalgias.  NEUROLOGICAL: No focal muscle weakness or numbness.   PSYCHOLOGICAL: No depression or anxiety.   PHYSICAL EXAMINATION:  VITAL SIGNS: Blood pressure 106/70, pulse 78, respirations 20, temperature 97.3, pulse oximetry 91%.  HEENT: Pupils equal and reactive to light and accommodation.  NECK: Supple without thyromegaly.  LUNGS: Clear.  HEART: Normal JVP. Normal PMI. Regular rate and rhythm. Normal S1, S2. No appreciable gallop, murmur, or rub.  ABDOMEN: Soft and nontender. Pulses were intact bilaterally.  MUSCULOSKELETAL: Normal muscle tone.  NEUROLOGIC: The patient had obvious cognitive impairment. Motor and sensory are grossly intact.   IMPRESSION: A 49 year old gentleman who presents with history of frequent falls, multiple fractures with admission labs diagnostic for rhabdomyolysis with elevated liver enzymes with borderline elevated troponin likely due to demand supply ischemia in the absence of chest pain or ECG changes.   RECOMMENDATIONS:  1.  Agree with overall current therapy.  2.  Would defer full dose anticoagulation.  3.  Would defer further cardiac diagnostics at this time.   ____________________________ Marcina MillardAlexander Lynden Carrithers, MD ap:nr D: 01/12/2014 10:45:55 ET T: 01/12/2014 12:38:13 ET JOB#: [id number]  cc: Marcina MillardAlexander Jerick Khachatryan, MD, <Dictator> Marya AmslerMarshall W. Dareen PianoAnderson, MD Marcina MillardALEXANDER Vanesa Renier MD ELECTRONICALLY SIGNED 01/21/2014 12:16

## 2014-08-16 NOTE — H&P (Signed)
PATIENT NAME:  Daniel Mitchell, Daniel Mitchell MR#:  [id number] DATE OF BIRTH:  [birth year]  DATE OF ADMISSION:  04/10/2014  REFERRING PHYSICIAN: Dr. Chiquita Loth.    PRIMARY CARE DOCTOR:  Dr. Malva Limes.   ADMIT DIAGNOSIS:  Sepsis.    HISTORY OF PRESENT ILLNESS: This is a 49 year old Caucasian male who presents to the Emergency Department via EMS from his assisted living facility for fever and chills. 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 The patient underwent the procedure without complication and was discharged home in the care of his parents. At the assisted living facility the nursing team noticed that he was not as alert as he usually is and that he had a fever of 102. The patient also had some chills and rigors which prompted them to bring him to the Emergency Department. Laboratories in the Emergency Department did not reveal leukocytosis, but confirmed that he was febrile. His blood pressure was good, but he clearly had altered mental status and the beginning of sepsis syndrome, which prompted the Emergency Department to call for admission.   REVIEW OF SYSTEMS:    CONSTITUTIONAL: The patient admits to subjective fever and chills.  EYES: Denies inflammation or blurred vision.  EARS, NOSE, AND THROAT: Denies tinnitus or sore throat.  RESPIRATORY: Denies cough or shortness of breath.  CARDIOVASCULAR: Denies chest pain or palpitations.  GASTROINTESTINAL: Denies nausea, vomiting, or diarrhea.  GENITOURINARY: The patient states that his penis hurts, but specifically denies dysuria, increased frequency, or hesitancy of urination.  ENDOCRINE: Denies polyuria, polydipsia.  HEMATOLOGIC AND LYMPHATIC: Denies easy bruising, but the patient admits to some bleeding from his urethra.  MUSCULOSKELETAL: The patient denies arthralgias or myalgias.  INTEGUMENTARY: Denies rashes or lesions.  NEUROLOGIC: Denies numbness in his  extremities or dysarthria.  PSYCHIATRIC: Denies depression or suicidal ideation.   PAST MEDICAL HISTORY: Significant for developmental delay and mild mental retardation.   PAST SURGICAL HISTORY: The patient has had multiple hypospadias repair and revision, also cholecystectomy and appendectomy.   SOCIAL HISTORY: The patient denies tobacco, alcohol, or drug abuse.   FAMILY HISTORY: His father is a survivor of prostate cancer as well as lung cancer.   MEDICATIONS:  1. Divaproex sodium extended release 500 mg tablets 2 tabs p.o. b.i.d.  2. Citalopram 1 tab p.o. b.i.d.  3. Loratadine 1 tab p.o. daily.  4. Pravastatin 20 mg 1 tab p.o. at bedtime.  5. Invega 3 mg extended release 1 tablet p.o. every morning.  6. Risperidone 1 mg disintegrating tablet 1 tab p.o. as needed for agitation, may repeat in 20 minutes with approval.  7. Nystatin and triamcinolone 0.1% topical cream applied to affected area b.i.d.  8. Ferrous sulfate 325 mg 1 tab p.o. daily.  9. Baclofen 10 mg 1 tab p.o. t.i.d.  10. Olopatadine nasal spray 665 mcg per inhalation 2 sprays to each nostril b.i.d.  11. Diaper Derm applied topically to the perineum 2 times a day as needed for rash.   ALLERGIES: AZITHROMYCIN.     PERTINENT LABORATORY RESULTS AND RADIOGRAPHIC FINDINGS: Serum glucose is 144, BUN 12, creatinine 0.98, serum sodium 146, potassium 4.6, chloride 109, bicarbonate 25, calcium 8, magnesium is 1.6, serum albumin is 2.4, alkaline phosphatase 36, AST 18, ALT 17. Troponin is negative. White blood cell count is 9.1, hemoglobin is 12.2, hematocrit 37.6. platelet count 92. Urinalysis is grossly bloody with 89 white blood cells per high powered field. It is nitrite negative. Venous lactic  acid is 2.4. Chest x-ray shows an elevated right hemidiaphragm with minimal right basilar subsegmental atelectasis.   PHYSICAL EXAMINATION:  VITAL SIGNS: Temperature is 101.4, pulse 106, respirations are 20, blood pressure 108/60, pulse  oximetry 90% on 2 liters oxygen via nasal cannula.  GENERAL: The patient is awake and oriented to person and surroundings, however he is somewhat somnolent and requires a mild amount of arousing prior to answering questions.  HEENT: Normocephalic, atraumatic. Pupils equal, round, and reactive to light and accommodation. Extraocular movements are intact. Mucous membranes are moist.  NECK: Trachea is midline. No adenopathy.  CHEST: Symmetric and atraumatic.  CARDIOVASCULAR: Regular rate and rhythm. Normal S1, S2. No rubs, clicks, or murmurs.  LUNGS: Clear to auscultation bilaterally with normal effort and excursion.  ABDOMEN: Positive bowel sounds, soft, nontender, nondistended. No hepatosplenomegaly.  GENITOURINARY:  Small surgically reconstructed external male genitalia with Foley in place and swelling of the glans penis. There is serosanguineous fluid draining around Foley catheter as well as in the urinary bag.  MUSCULOSKELETAL: The patient moves all 4 extremities equally. There is 5 out of 5 strength in upper and lower extremities bilaterally.  SKIN: No rashes or lesions.  EXTREMITIES: No clubbing, cyanosis, or edema.  NEUROLOGIC: Cranial nerves II through XII are grossly intact.  PSYCHIATRIC: Mood is normal. Affect is congruent.   ASSESSMENT AND PLAN: This is a 49 year old pleasant gentleman with presumed early stage sepsis from a urinary tract infection.   1.  Sepsis status post urinary instrumentation. The patient has a history of recurrent urinary tract infections and seems to have developed sepsis secondary to urinary tract infection. He is currently hemodynamically stable. We have obtained blood cultures and urine cultures. Per Emergency Department protocol patient was given Zosyn, Levaquin, and vancomycin in the Emergency Department. We will continue intravenous fluid and monitor the patient's heart rate. 2.  Urinary tract infection. The patient has been on Augmentin since before his  procedure. The plan is to repeat white blood cell count in the morning and reassess his sepsis criteria. We will adjust his antibiotic coverage per sensitivity. We may need to retrieve historic sensitivity results as this may be a resistant organism.  3.  Hypospadias. The patient is status post multiple repairs. Apparently he underwent some skin grafting to his glans penis that may have had some hair follicles which are a nidus for infection as well as possible calcium deposition and subsequent formation of renal calculi. The patient does not appear to have any obstruction at this time. His Foley is in place and we will monitor for clearance of the blood from the urine.  4.  Obesity. The patient's BMI is 31.5. I have encouraged a healthy diet.  5.  Deep vein thrombosis prophylaxis. Heparin. The patient does not have hemorrhagic cystitis a brisk bleeding. The oozing from his urethra is likely postprocedural or somewhat traumatic from placement of a Foley prior to arrival in the hospital.  6.  Gastrointestinal prophylaxis. None.   CODE STATUS: The patient is a full code.   TIME SPENT ON ADMISSION ORDERS AND PATIENT CARE: Approximately 45 minutes.     ____________________________ Kelton PillarMichael S. Sheryle Hailiamond, MD msd:bu D: 04/10/2014 18:15:51 ET T: 04/10/2014 18:35:21 ET JOB#: [id number]  cc: Kelton PillarMichael S. Sheryle Hailiamond, MD, <Dictator> Kelton PillarMICHAEL S Chiniqua Kilcrease MD ELECTRONICALLY SIGNED 04/11/2014 0:24

## 2014-08-16 NOTE — H&P (Signed)
PATIENT NAME:  Daniel Mitchell, Daniel Mitchell MR#:  [id number] DATE OF BIRTH:  [birth year]  DATE OF ADMISSION:  02/22/2014  PRIMARY CARE PHYSICIAN: Marya Amsler. Dareen Piano, MD  REFERRING EMERGENCY ROOM PHYSICIAN: Cecille Amsterdam. Mayford Knife, MD  CHIEF COMPLAINT: Weakness.   HISTORY OF PRESENT ILLNESS: A 49 year old male who has mental retardation with cognitive impairment, hypertension and diabetes, who lives in a group home and has urinary issues and recurrent UTIs, who was admitted last month for a fall and was found having UTI, treated for that and discharged. He came back today from the group home because he is more weak and not able to stand up 2 parties required with walking, so they sent him to the Emergency Room.   On initial workup, he was found having positive urinalysis, and so was given to the hospitalist team for admission for UTI.   History from the patient's mother, who is present in the room, and his caretaker at the group home.   REVIEW OF SYSTEMS: Not able to get, as the patient is mentally retarded.   PAST MEDICAL HISTORY:  1.  Mental retardation with cognitive impairment.  2.  Benign hypertension.  3.  Type 2 diabetes.  4.  Previous shoulder dislocation.  5.  Previous ankle fracture.  6.  Status post appendectomy.  7.  Status post cholecystectomy.   SOCIAL HISTORY: Negative for alcohol or tobacco use, and lives in a group home.   FAMILY HISTORY: Noncontributory. No history of hypertension.   MEDICATIONS BEFORE ADMISSION:  1.  Risperidone 1 mg oral tablet as needed for agitation.  2.  Pravastatin 20 mg oral once a day at bedtime.  3.  Paliperidone  3 mg oral 2 tablets once a day.  4.  Nystatin and triamcinolone cream applied topically 2 times a day on rashes.  5.  Loratadine 10 mg oral tablet once a day as needed for allergy symptoms.  6.  Invega 3 mg oral tablet once a day in the morning for psychotic behavior.  7.  Ferrous sulfate 325 mg once a day.  8.  Escitalopram 20 mg oral  once a day for depression.  9.  Divalproex sodium 500 mg oral delayed-release tablet 2 times a day.  10.  Baclofen 10 mg oral tablet 3 times a day for 3 days.  11.  Acetaminophen 325 mg oral 2 tablets every 4 hours as needed for temperature and pain.   PHYSICAL EXAMINATION:  VITAL SIGNS: In the ER, temperature 98.3, pulse is 92, respirations 18, blood pressure 93/51 and pulse oximetry is 96 on room air.  GENERAL: The patient is alert and has mental retardation.  HEENT: Head and neck atraumatic. Conjunctivae pink. Oral mucosa moist.  NECK: Supple. No JVD.  RESPIRATORY: Bilateral equal and clear air entry.  CARDIOVASCULAR: S1, S2 present, regular. No murmur.  ABDOMEN: Soft, nontender. Bowel sounds present. No organomegaly.  SKIN: There are rashes and some redness in the inguinal area present; wearing diapers currently; no ulcers.  JOINTS: No swelling or tenderness.  LEGS: Slight edema present on bilateral ankles.  NEUROLOGICAL: Both upper limbs power 4/5. Both lower limbs power 2/5. Some muscle deformity present on the lower limbs at feet. Follows simple commands. No rigidity or tremors.  PSYCHIATRIC: As mentioned above, he has some baseline mental retardation and cognitive impairment.   IMPORTANT LABORATORY RESULTS: Glucose 161, BUN 9, creatinine 1.03, sodium 140, potassium is 4.1, chloride 104, CO2 of 26, calcium is 8.8. Total protein 6.3, albumin 2.8, bilirubin 0.4, alkaline  phosphate 45, SGOT 20 and SGPT 19. WBC 9, hemoglobin is 12.6, platelet count is 102,000, MCV is 98. Urinalysis is positive with 3+ leukocyte esterase and 713 WBCs.   ASSESSMENT AND PLAN: A 49 year old male with mental retardation, lives in a group home and has recurrent urinary tract infections. He was sent to Emergency Room because of weakness and found having positive urinalysis.  1.  Mental retardation with cognitive impairment. This is a baseline issue and, as per mother, he is at his baseline. We will just continue  monitoring.  2.  Urinary tract infection. We will give intravenous Rocephin currently, and will follow the urine culture reports to adjust antibiotic to oral. He has recurrent urinary tract infections because of previous abnormalities and surgeries in the past.  3.  Agitation and mental retardation. This is a baseline issue, and we will just continue his medication, Respirdone, as he is taking at home.  4.  Hyperlipidemia. We will continue pravastatin.   I with speak to Dr. Einar CrowMarshall Anderson about this case.  TOTAL TIME SPENT ON THIS ADMISSION: 50 minutes    ____________________________ Hope PigeonVaibhavkumar G. Elisabeth PigeonVachhani, MD vgv:MT D: 02/22/2014 15:05:03 ET T: 02/22/2014 15:22:43 ET JOB#: [id number]  cc: Hope PigeonVaibhavkumar G. Elisabeth PigeonVachhani, MD, <Dictator> Altamese DillingVAIBHAVKUMAR [employer] MD ELECTRONICALLY SIGNED 03/05/2014 22:19

## 2014-08-16 NOTE — H&P (Signed)
PATIENT NAME:  Daniel Mitchell, Daniel Mitchell MR#:  [id number] DATE OF BIRTH:  1965/08/01  DATE OF ADMISSION:  01/11/2014  REFERRING PHYSICIAN: Dr. Daryel NovemberJonathan Williams.   FAMILY PHYSICIAN: Dr. Einar CrowMarshall Anderson.   REASON FOR ADMISSION: Rhabdomyolysis.   HISTORY OF PRESENT ILLNESS: The patient is a 49 year old male with mental retardation, who resides in a group home. The patient also has a significant history of hypertension, diabetes, and previous dislocated shoulder. The patient presents to the Emergency Room today after recurrent falls out of bed. The patient has been falling more often and has fallen out of bed the last 2 nights. Was brought to the Emergency Room where he was found to have a urinary tract infection and significant elevation of his CPK consistent with rhabdomyolysis. He is now admitted for further evaluation.   PAST MEDICAL HISTORY:  1.  Mental retardation with cognitive impairment.  2.  Benign hypertension.  3.  Type 2 diabetes.  4.  Previous shoulder dislocation.  5.  Previous ankle fracture.  6.  Status post appendectomy.  7.  Status post cholecystectomy.   MEDICATIONS:  1.  Risperdal 1 mg q. 6 hours as needed for agitation.  2.  Pravastatin 20 mg p.o. at bedtime.  3.  Claritin 10 mg p.o. daily.  4.  Invega 3 mg p.o. daily.  5.  Iron sulfate 325 mg p.o. daily.  6.  Lexapro 20 mg p.o. daily.  7.  Depakote 1000 mg p.o. [name]  8.  Baclofen 10 mg p.o. t.i.d.   ALLERGIES: AZITHROMYCIN.     SOCIAL HISTORY: Negative for alcohol or tobacco abuse.   FAMILY HISTORY: Noncontributory.   REVIEW OF SYSTEMS:  Unable to obtain.   PHYSICAL EXAMINATION:  GENERAL: The patient is in no acute distress.  VITAL SIGNS: Currently remarkable for a blood pressure of 124/78 with a heart rate of 92, respiratory rate of 18, temperature of 98.3, saturation 98% on room air.  HEENT: Normocephalic, atraumatic. Pupils are equal, round, and reactive to light and accommodation. Extraocular movements are  intact. Sclerae are anicteric. Conjunctivae are clear.   Oropharynx is clear.   NECK: Supple without JVD. No adenopathy or thyromegaly is noted.  LUNGS: Clear to auscultation and percussion without wheezes, rales, or rhonchi. No dullness. Respiratory effort is normal.  CARDIAC: Regular rate and rhythm. Normal S1, S2. No significant rubs, murmurs, or gallops. PMI is nondisplaced. Chest wall is nontender.  ABDOMEN: Soft, nontender, with normoactive bowel sounds. No organomegaly or masses were appreciated. No hernias or bruits were noted.  EXTREMITIES: Without clubbing, cyanosis, edema. Pulses were 2+ bilaterally.  SKIN: Warm and dry with some excoriations on the knees.  NEUROLOGIC: Cranial nerves II through XII grossly intact. Deep tendon reflexes were symmetric. Motor and sensory exam is nonfocal.  PSYCHIATRIC: The patient was alert but disoriented to place and time.    IMAGING/LABORATORY DATA: Chest x-ray was unremarkable. Films of the left hand were also unremarkable. Head CT showed no acute intracranial abnormalities. Urine showed 38 WBCs with 2+ leukocyte esterase. White count was 8.5 with a hemoglobin of 13.1. His glucose was 139 with a BUN of 10, creatinine of 0.89 and a GFR of greater than 60. His ALT was 107 with an AST of 526. Troponin was 0.18.  Total CK was 16,324, with an MB of 91.5. Depakote level was elevated at 137.   ASSESSMENT:  1.  Rhabdomyolysis.  2.  Depakote toxicity.  3.  Elevated troponin of unclear significance.  4.  Abnormal liver function  tests of unclear significance.  5.  Recurrent falls.  6.  Urinary tract infection.  7.  Mental retardation.   PLAN: The patient will be admitted to the floor and started on IV fluids and empiric IV antibiotics. A urine culture has been sent. We will send off hepatitis serologies and obtain an abdominal ultrasound. We will lower his Depakote dose. Continue IV fluids. Consult cardiology due to the elevated troponin. Followup labs in the  morning. We will follow serial CPKs and troponins. Consult physical therapy and clinical social work. Further treatment and evaluation will depend upon the patient's progress.   TOTAL TIME SPENT ON THIS PATIENT:   50 minutes.    ____________________________ Duane Lope Judithann Sheen, MD jds:nr D: 01/11/2014 14:30:41 ET T: 01/11/2014 14:43:04 ET JOB#: [id number]  cc: Duane Lope. Judithann Sheen, MD, <Dictator> Cecille Amsterdam. Mayford Knife, MD Marya Amsler. Dareen Piano, MD JEFFREY Rodena Medin MD ELECTRONICALLY SIGNED 01/11/2014 16:41

## 2014-08-16 NOTE — Discharge Summary (Signed)
PATIENT NAME:  Daniel Mitchell, Daniel Mitchell MR#:  [id number] DATE OF BIRTH:  August 18, 1965  DATE OF ADMISSION:  04/14/2014 DATE OF DISCHARGE:  04/17/2014  DISCHARGE DIAGNOSES: 1.  Urinary tract infection post urologic procedure with recurrence. 2.  Encephalopathy.  3.  Worsening weakness.  4.  Developmental disability.   DISCHARGE MEDICATIONS: [facility]. Please see for details. He will basically be on his home meds but switching his levofloxacin. He was on ceftriaxone 500 [name] for 10 days as he has consistently responded to ceftriaxone in the hospital and urine cultures were negative.   HISTORY AND PHYSICAL: Please see detailed history and physical done on admission.   HOSPITAL COURSE: The patient was admitted with above, recurrence after 2 to 3 days after discharge. Urine cultures were initially negative. Likely had antibiotics with his initial urologic procedure for removing a stone from his urethra that likely stemmed from distant hypospadias surgery, per report. He again responded to IV fluids and ceftriaxone. Cultures were again negative here. White count is normal. He is too weak to contemplate going [facility] at this point so rehab has been arranged. He has a bed offer, although at this point we are still awaiting a PASRR number. We discussed this with the patient and the family, etc.   TIME SPENT: It took approximately 35 minutes to do all discharge tasks today. ____________________________ Marya AmslerMarshall W. Dareen PianoAnderson, MD mwa:sb D: 04/17/2014 10:42:42 ET T: 04/17/2014 11:07:18 ET JOB#: [id number]  cc: Marya AmslerMarshall W. Dareen PianoAnderson, MD, <Dictator> Lauro RegulusMARSHALL W ANDERSON MD ELECTRONICALLY SIGNED 04/19/2014 9:28

## 2014-08-16 NOTE — Discharge Summary (Signed)
PATIENT NAME:  Daniel Mitchell, Daniel Mitchell MR#:  [id number] DATE OF BIRTH:  13-Oct-1965  DATE OF ADMISSION:  02/22/2014 DATE OF DISCHARGE:  02/26/2014  DISCHARGE DIAGNOSES: 1.  Sepsis.  2.  Urinary tract infection.  3.  Severe weakness.  4.  Severe mental retardation.  5.  Hypotension, chronic periods are worsening as well.   6.  Osteoarthritis, bilateral knees.   DISCHARGE MEDICATIONS: [facility]. Please see discharge instruction for details.  Basically, will be on Cipro 500 [name] for 10 days.   Will use his TED hose while at the workshop otherwise he will be on his usual medications.   HISTORY AND PHYSICAL: Please see detailed history and physical done on admission.   HOSPITAL COURSE: The patient was admitted, weak, blood pressure low over and above his usual status, down in the 70s and 80s. He had periods of low blood pressure with fluid boluses given. He is stable now, has been working with PT.  He  was able stand and sit. [facility]. He had marked pyuria and hematuria on the initial urinalysis though the cultures seemed to be contaminated. He had been on the ceftriaxone with good results. He was given IV fluids; again seemed to help his blood pressure. Work-up was negative otherwise except for his x-rays of his knees which did show bilateral osteoarthritis. We will have physical therapy that works with [facility] work with him when he is there.     ____________________________ Marya AmslerMarshall W. Dareen PianoAnderson, MD mwa:jp D: 02/26/2014 08:05:36 ET T: 02/26/2014 10:37:13 ET JOB#: [id number]  cc: Marya AmslerMarshall W. Dareen PianoAnderson, MD, <Dictator> Lauro RegulusMARSHALL W Shaarav Ripple MD ELECTRONICALLY SIGNED 03/04/2014 7:35

## 2014-08-16 NOTE — Discharge Summary (Signed)
PATIENT NAME:  Daniel Mitchell, Daniel Mitchell MR#:  [id number] DATE OF BIRTH:  May 07, 1965  DATE OF ADMISSION:  04/14/2014 DATE OF DISCHARGE:  04/21/2014  DISCHARGE SUMMARY UPDATE  Please note that the patient stayed a few more days as the [facility] offices were closed and a PASARR number could not be obtained. He was stable, but continued to be weak. 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 This was only sensitive to linezolid, so that will be started as 600 [name] We will hold his Lexapro while on that. He should need that for 10 days. His Lexapro can be restarted after his linezolid is finished. Other medication orders should be the same as per [facility] previously done. Should be off the ceftriaxone.   ____________________________ Marya AmslerMarshall W. Dareen PianoAnderson, MD mwa:JT D: 04/21/2014 08:11:41 ET T: 04/21/2014 08:19:23 ET JOB#: [id number]  cc: Marya AmslerMarshall W. Dareen PianoAnderson, MD, <Dictator> Lauro RegulusMARSHALL W Jersee Winiarski MD ELECTRONICALLY SIGNED 04/22/2014 9:25

## 2014-08-16 NOTE — Discharge Summary (Signed)
PATIENT NAME:  Daniel Mitchell, Daniel Mitchell MR#:  [id number] DATE OF BIRTH:  09-07-1965  DATE OF ADMISSION:  04/10/2014 DATE OF DISCHARGE:  04/11/2014  DISCHARGE DIAGNOSES: 1. Sepsis.  2. Urinary tract infection.  3. Developmental disability.  4. Recent urethral procedure with hypospadias and urethral stone by Dr. Lonna CobbStoioff on the day prior to admission.   DISCHARGE MEDICATIONS: [facility] med [facility]. Basically, he will be on his usual home meds, as well as Levaquin  500 mg daily for 7 days.   HISTORY AND PHYSICAL: Please see detailed history and physical done at admission.   HOSPITAL COURSE: The patient was admitted, chills, rigors, minimally elevated lactic acidosis post urologic procedures for the above; though, we do not have full details of that, as [facility] was [facility]. He did improve clinically, with fever going away. Blood cultures were negative. Urine culture did not appear to have been collected, but he had a positive UA. We will treat him with the Levaquin and have him follow up with urology next week for a urinalysis check to ensure he is improving. If that cannot be done at urology, we can do it here. I discussed this at length with the patient's mother, who is his caretaker, along with the group home personnel.    ____________________________ Marya AmslerMarshall W. Dareen PianoAnderson, MD mwa:mw D: 04/11/2014 08:54:26 ET T: 04/11/2014 13:34:13 ET JOB#: [id number]  cc: Marya AmslerMarshall W. Dareen PianoAnderson, MD, <Dictator> Lauro RegulusMARSHALL W Athanasia Stanwood MD ELECTRONICALLY SIGNED 04/14/2014 16:26

## 2014-08-16 NOTE — Discharge Summary (Signed)
PATIENT NAME:  Daniel Mitchell, Daniel Mitchell MR#:  [id number] DATE OF BIRTH:  October 27, 1965  DATE OF ADMISSION:  01/11/2014 DATE OF DISCHARGE:  01/14/2014  DISCHARGE DIAGNOSES: 1. Encephalopathy.  2. Depakote toxicity.  3. Urinary tract infection with Escherichia coli.  4. Rhabdomyolysis on pravastatin while on antibiotics.  5. Severe mental retardation.   DISCHARGE MEDICATIONS: [facility]. Please see for details. He will be on cefuroxime for 3 weeks to ensure his urinary tract infection is treated well, and Cipro and sulfa could not be given, which would make this difficult to clear the prostate.   HISTORY OF PRESENT ILLNESS: Please see detailed history and physical done on admission.   HOSPITAL COURSE: The patient was admitted, confused, falling, evidence of a urinary tract infection seen. Cultures of his urine ultimately grew Escherichia coli, resistant to sulfa and Cipro, as noted. Did have a sensitivity to ceftriaxone, cefazolin and cefoxitin. His total CK was 16,324. He was given IV fluids. His valproic acid was 137, which was felt to be elevated in this situation. His levels were decreased. CK decreased down to 3000 by September 21. He was eating and drinking well. PT thought he was back to his baseline. Head CT twice showed no evidence of trauma. Valproic acid level was down to 53 on September 21. [facility] He will be seen there. I will follow him up soon.   TIME SPENT: Please note it took approximately 35 minutes to do all discharge tasks.   ____________________________ Marya AmslerMarshall W. Dareen PianoAnderson, MD mwa:JT D: 01/14/2014 13:46:10 ET T: 01/14/2014 14:00:57 ET JOB#: [id number]  cc: Marya AmslerMarshall W. Dareen PianoAnderson, MD, <Dictator> Lauro RegulusMARSHALL W Keilyn Haggard MD ELECTRONICALLY SIGNED 01/15/2014 8:16

## 2014-09-03 HISTORY — PX: CYSTOSCOPY: SUR368

## 2014-09-08 ENCOUNTER — Encounter (HOSPITAL_COMMUNITY): Payer: Self-pay | Admitting: Dentistry

## 2014-10-14 ENCOUNTER — Emergency Department
Admission: EM | Admit: 2014-10-14 | Discharge: 2014-10-14 | Disposition: A | Discharge status: Home / Self Care | Payer: Medicare Other | Attending: Emergency Medicine | Admitting: Emergency Medicine

## 2014-10-14 ENCOUNTER — Encounter: Payer: Self-pay | Admitting: Urgent Care

## 2014-10-14 DIAGNOSIS — S0990XA Unspecified injury of head, initial encounter: Secondary | ICD-10-CM | POA: Diagnosis present

## 2014-10-14 DIAGNOSIS — Y92129 Unspecified place in nursing home as the place of occurrence of the external cause: Secondary | ICD-10-CM | POA: Insufficient documentation

## 2014-10-14 DIAGNOSIS — Z7952 Long term (current) use of systemic steroids: Secondary | ICD-10-CM | POA: Diagnosis not present

## 2014-10-14 DIAGNOSIS — I10 Essential (primary) hypertension: Secondary | ICD-10-CM | POA: Diagnosis not present

## 2014-10-14 DIAGNOSIS — Y998 Other external cause status: Secondary | ICD-10-CM | POA: Insufficient documentation

## 2014-10-14 DIAGNOSIS — W2209XA Striking against other stationary object, initial encounter: Secondary | ICD-10-CM | POA: Diagnosis not present

## 2014-10-14 DIAGNOSIS — E119 Type 2 diabetes mellitus without complications: Secondary | ICD-10-CM | POA: Diagnosis not present

## 2014-10-14 DIAGNOSIS — Y9289 Other specified places as the place of occurrence of the external cause: Secondary | ICD-10-CM | POA: Diagnosis not present

## 2014-10-14 DIAGNOSIS — Y9389 Activity, other specified: Secondary | ICD-10-CM | POA: Insufficient documentation

## 2014-10-14 DIAGNOSIS — Z79899 Other long term (current) drug therapy: Secondary | ICD-10-CM | POA: Insufficient documentation

## 2014-10-14 DIAGNOSIS — S01111A Laceration without foreign body of right eyelid and periocular area, initial encounter: Secondary | ICD-10-CM | POA: Diagnosis not present

## 2014-10-14 DIAGNOSIS — S0083XA Contusion of other part of head, initial encounter: Secondary | ICD-10-CM

## 2014-10-14 NOTE — Discharge Instructions (Signed)
Facial or Scalp Contusion A facial or scalp contusion is a deep bruise on the face or head. Injuries to the face and head generally cause a lot of swelling, especially around the eyes. Contusions are the result of an injury that caused bleeding under the skin. The contusion may turn blue, purple, or yellow. Minor injuries will give you a painless contusion, but more severe contusions may stay painful and swollen for a few weeks.  CAUSES  A facial or scalp contusion is caused by a blunt injury or trauma to the face or head area.  SIGNS AND SYMPTOMS   Swelling of the injured area.   Discoloration of the injured area.   Tenderness, soreness, or pain in the injured area.  DIAGNOSIS  The diagnosis can be made by taking a medical history and doing a physical exam. An X-ray exam, CT scan, or MRI may be needed to determine if there are any associated injuries, such as broken bones (fractures). TREATMENT  Often, the best treatment for a facial or scalp contusion is applying cold compresses to the injured area. Over-the-counter medicines may also be recommended for pain control.  HOME CARE INSTRUCTIONS   Only take over-the-counter or prescription medicines as directed by your health care provider.   Apply ice to the injured area.   Put ice in a plastic bag.   Place a towel between your skin and the bag.   Leave the ice on for 20 minutes, 2-3 times a day.  SEEK MEDICAL CARE IF:  You have bite problems.   You have pain with chewing.   You are concerned about facial defects. SEEK IMMEDIATE MEDICAL CARE IF:  You have severe pain or a headache that is not relieved by medicine.   You have unusual sleepiness, confusion, or personality changes.   You throw up (vomit).   You have a persistent nosebleed.   You have double vision or blurred vision.   You have fluid drainage from your nose or ear.   You have difficulty walking or using your arms or legs.  MAKE SURE YOU:    Understand these instructions.  Will watch your condition.  Will get help right away if you are not doing well or get worse. Document Released: 05/19/2004 Document Revised: 01/30/2013 Document Reviewed: 11/22/2012 [facility] Patient Information 2015 [place], [place]. This information is not intended to replace advice given to you by your health care provider. Make sure you discuss any questions you have with your health care provider.  Keep the minor wound clean, dry, and covered if needed. Apply ice to reduce swelling. Follow-up with Dr. Gaynell Face as scheduled.

## 2014-10-14 NOTE — ED Provider Notes (Signed)
[facility] Emergency Department Provider Note ____________________________________________  Time seen: 2015  I have reviewed the triage vital signs and the nursing notes.  HISTORY  Chief Complaint  Head Injury  HPI Daniel Mitchell is a 49 y.o. male reports to the ED, in the care of his assisted living facility caregiver, for evaluation and management of a minor head injury. He apparently hit his head on the table, during an unassisted transfer from his wheelchair to the chair, where he slipped while leaning forward. There was no reported or witnessed loss of consciousness, nausea, vomiting, or reports of headache. The caregiver reports his behavior and cognition is at baseline this time. He has sustained a small, superficial laceration to the forehead.  Past Medical History  Diagnosis Date  . Developmental disability   . Seizure disorder   . Urogenital disorder     Multiple surgeries; H/O Undescended teticle; nocturnal enuresis  . Ataxia     Demyelinating motor neuropathy  . Hypertension   . Diabetes mellitus   . Depression     History of depression  . Seasonal allergies     Patient Active Problem List   Diagnosis Date Noted  . 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 No pertinent past surgical history.  Current Outpatient Rx  Name  Route  Sig  Dispense  Refill  . baclofen (LIORESAL) 10 MG tablet   Oral   Take 10 mg by mouth 3 (three) times daily.           . divalproex (DEPAKOTE) 500 MG DR tablet   Oral   Take 500 mg by mouth. Take 2 tablets every morning and two tablets every evening         . docusate sodium (COLACE) 100 MG capsule   Oral   Take 100 mg by mouth 2 (two) times daily.         Marland Kitchen escitalopram (LEXAPRO) 20 MG tablet   Oral   Take 20 mg by mouth 2 (two) times daily.          . fexofenadine (ALLEGRA) 180 MG tablet   Oral   Take 180 mg by mouth daily.         . fluticasone (FLONASE) 50 MCG/ACT nasal  spray   Each Nare   Place 1 spray into both nostrils daily.         . Olopatadine HCl (PATANASE) 0.6 % SOLN   Nasal   Place 2 puffs into the nose. Place 2 sprays in each nostril twice daily for nasal drainage         . paliperidone (INVEGA) 3 MG 24 hr tablet   Oral   Take 3 mg by mouth every morning.           . pravastatin (PRAVACHOL) 20 MG tablet   Oral   Take 20 mg by mouth daily.         . rifampin (RIFADIN) 300 MG capsule   Oral   Take by mouth 2 (two) times daily.         . risperiDONE (RISPERDAL M-TABS) 1 MG disintegrating tablet   Oral   Take 1 mg by mouth. Take 1 tab in dissolve under tongue as needed for agitation. May repeat and 20 minutes per protocols.         . topiramate (TOPAMAX) 25 MG tablet   Oral   Take 25 mg by mouth 2 (two) times daily.         Marland Kitchen  clotrimazole (LOTRIMIN) 1 % cream   Topical   Apply topically. Apply to feet once daily as needed         . ferrous sulfate 325 (65 FE) MG EC tablet   Oral   Take 325 mg by mouth daily.           Marland Kitchen loratadine (CLARITIN) 10 MG tablet   Oral   Take 10 mg by mouth daily as needed.          . nystatin-triamcinolone (MYCOLOG II) cream   Topical   Apply 1 application topically 2 (two) times daily.           Allergies Zithromax  No family history on file.  Social History History  Substance Use Topics  . Smoking status: Never Smoker   . Smokeless tobacco: Never Used  . Alcohol Use: No   Review of Systems  Constitutional: Negative for fever. Eyes: Negative for visual changes. ENT: Negative for sore throat. Cardiovascular: Negative for chest pain. Respiratory: Negative for shortness of breath. Gastrointestinal: Negative for abdominal pain, vomiting and diarrhea. Genitourinary: Negative for dysuria. Musculoskeletal: Negative for back pain. Skin: Negative for rash. Small abrasion/laceration as above Neurological: Negative for headaches, focal weakness or  numbness. ____________________________________________  PHYSICAL EXAM:  VITAL SIGNS: ED Triage Vitals  Enc Vitals Group     BP 10/14/14 1941 113/73 mmHg     Pulse Rate 10/14/14 1941 89     Resp 10/14/14 1941 16     Temp 10/14/14 1941 97.8 F (36.6 C)     Temp Source 10/14/14 1941 Oral     SpO2 10/14/14 1941 94 %     Weight 10/14/14 1941 175 lb (79.379 kg)     Height 10/14/14 1941 5\' 2"  (1.575 m)     Head Cir --      Peak Flow --      Pain Score --      Pain Loc --      Pain Edu? --      Excl. in GC? --    Constitutional: Alert and oriented. Well appearing and in no distress. Eyes: Conjunctivae are normal. PERRL. Normal extraocular movements. ENT   Head: Normocephalic and atraumatic, except for a less than 1 cm laceration over the right brow. No active bleeding noted.  Minimal STS noted.    Nose: No congestion/rhinnorhea.   Mouth/Throat: Mucous membranes are moist.   Neck: Supple. No thyromegaly. Hematological/Lymphatic/Immunilogical: No cervical lymphadenopathy. Cardiovascular: Normal rate, regular rhythm.  Respiratory: Normal respiratory effort. No wheezes/rales/rhonchi. Musculoskeletal: Nontender with normal range of motion in all extremities.  Neurologic: No gross focal neurologic deficits are appreciated. Skin:  Skin is warm, dry and intact. No rash noted. Psychiatric: Mood and affect are normal. Patient exhibits appropriate insight and judgment. ____________________________________________  INITIAL IMPRESSION / ASSESSMENT AND PLAN / ED COURSE  Minor head injury and facial contusion without LOC or concussion. Follow-up with primary care provider as scheduled.  ____________________________________________  FINAL CLINICAL IMPRESSION(S) / ED DIAGNOSES  Final diagnoses:  Facial contusion, initial encounter      Lissa Hoard, PA-C 10/14/14 2221  Phineas Semen, MD 10/14/14 2314

## 2014-10-14 NOTE — ED Notes (Signed)
Patient presents with reports of a head injury. Patient was being assisted with a transfer - slipped and hit head on table. Patient with no reported LOC; behavior is at baseline per reports form caregiver.

## 2014-12-01 ENCOUNTER — Encounter (INDEPENDENT_AMBULATORY_CARE_PROVIDER_SITE_OTHER): Payer: Self-pay

## 2014-12-01 ENCOUNTER — Ambulatory Visit (HOSPITAL_COMMUNITY): Payer: Medicaid - Dental | Admitting: Dentistry

## 2014-12-01 ENCOUNTER — Encounter (HOSPITAL_COMMUNITY): Payer: Self-pay | Admitting: Dentistry

## 2014-12-01 VITALS — BP 120/72 | HR 80 | Temp 98.5°F

## 2014-12-01 DIAGNOSIS — E119 Type 2 diabetes mellitus without complications: Secondary | ICD-10-CM

## 2014-12-01 DIAGNOSIS — K08109 Complete loss of teeth, unspecified cause, unspecified class: Secondary | ICD-10-CM

## 2014-12-01 DIAGNOSIS — K036 Deposits [accretions] on teeth: Secondary | ICD-10-CM

## 2014-12-01 DIAGNOSIS — K0820 Unspecified atrophy of edentulous alveolar ridge: Secondary | ICD-10-CM

## 2014-12-01 DIAGNOSIS — K0530 Chronic periodontitis, unspecified: Secondary | ICD-10-CM

## 2014-12-01 DIAGNOSIS — Z972 Presence of dental prosthetic device (complete) (partial): Secondary | ICD-10-CM

## 2014-12-01 DIAGNOSIS — IMO0002 Reserved for concepts with insufficient information to code with codable children: Secondary | ICD-10-CM

## 2014-12-01 NOTE — Patient Instructions (Signed)
Plan:  1. Patient and cargivers are to continue to brush teeth at least twice daily with fluoride toothpaste. Dentures are to be cleaned with soap and water and then soaked overnight with the appropriate denture cleanser.  Patient's mouth is to be cleaned to remove residual denture adhesive at night. 2. Return to clinic for periodic oral examination and dental cleaning in 6 months.  3. Caregiver is to call if problems arise before the next scheduled appointment.   Ebany Bowermaster F. Anjanette Gilkey, DDS   

## 2014-12-01 NOTE — Progress Notes (Signed)
12/01/2014  Patient:            Daniel Mitchell Date of Birth:  Sep 16, 1965 MRN:                [id number]  BP 120/72 mmHg  Pulse 80  Temp(Src) 98.5 F (36.9 C) (Oral)   Daniel Mitchell is a 49 year old male that presents for periodic oral exam and cleaning.  Premedication: None required  Patient Active Problem List   Diagnosis Date Noted  . 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 Developmental disability   . Seizure disorder   . Urogenital disorder     Multiple surgeries; H/O Undescended testicle; nocturnal enuresis  . Ataxia     Demyelinating motor neuropathy  . Hypertension   . Diabetes mellitus type 2, controlled   . Depression     History of depression  . Seasonal allergies   . Chronic UTI (urinary tract infection)   . 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 Urethral foreign body 08/05/14    Dr. Lonna Cobb   Past Surgical History  Procedure Laterality Date  . Inguinal hernia repair    . Cystoscopy  04/09/14    Status post cystoscopy and removal of calculus  . Cystoscopy  09/03/14    Status post cystoscopy with removal of calculus    ALLERGIES/ADVERSE DRUG REACTIONS: Allergies  Allergen Reactions  . Zithromax (Azithromycin)    MEDICATIONS: Current Outpatient Prescriptions  Medication Sig Dispense Refill  . carbamazepine (TEGRETOL) 200 MG tablet Take 200 mg by mouth 3 (three) times daily.    . cetirizine (ZYRTEC) 10 MG tablet Take 10 mg by mouth 2 (two) times daily.    Marland Kitchen docusate sodium (COLACE) 100 MG capsule Take 100 mg by mouth 3 (three) times daily.     Marland Kitchen levocetirizine (XYZAL) 5 MG tablet Take 5 mg by mouth at bedtime.    Marland Kitchen lisinopril (PRINIVIL,ZESTRIL) 10 MG tablet Take 10 mg by mouth every morning.    Marland Kitchen lisinopril-hydrochlorothiazide (PRINZIDE,ZESTORETIC) 20-12.5 MG per tablet Take 1 tablet by mouth every morning.    . magnesium hydroxide (MILK OF MAGNESIA) 800 MG/5ML suspension Take 60 mLs by mouth.  60 ML's by mouth every Friday evening    . ranitidine (ZANTAC) 150 MG tablet Take 150 mg by mouth 2 (two) times daily.     No current facility-administered medications for this visit.    C/C: Patient presents for periodic oral examination and a dental cleaning.  HPI:  Patient was last seen for exam and cleaning and adjustment of an upper complete and lower partial denture on December 11, 2013. Patient was due for exam and cleaning in February 2016 but had to cancel multiple appointments due to history of neck urinary tract infections and urological surgeries. Patient is not complaining of any dental pain.   DENTAL EXAM: General: Patient is a developmentally disabled male in no acute distress. The patient was transferred to the dental chair via multiple chair device.  Extraoral Exam: There is NO palpable lymphadenopathy. There are no TMJ symptoms. Intraoral  Exam: Patient has excessive saliva. There is no denture irritation noted. There is gingival recession associated with lower right molar on lingual #30. No significant tooth mobility associated with tooth #30 at this time.  Dentition: Patient with tooth numbers 18, 19, 30, 31 remaining. Periodontal: Patient has chronic periodontitis with very minimal plaque accumulations, generalized gingival recession, and incipient mobility of tooth #'s 19 and  30. Patient has gingival recession on the lingual aspect of tooth #30.  No sensitivity reported today. Oral hygiene is Excellent and edentulous alveolar ridges are very healthy looking. Adult prophylaxis provided with a sonic scaler and selective hand curettes. Teeth were polished. Fluoride varnish applied. Caries: No dental caries noted today. Endodontic: No acute pulpitits symptoms.  C&B: Patient has crown the tooth numbers 19 and 30 with no obvious problems. Prosthodontic: Patient has an upper complete denture and a lower Flexite mandibular partial denture. DENTURES ARE CLEANED  ACCEPTABLY. Pressure indicating paste was applied to the upper complete and lower partial dentures. Adjustments were made as needed. [place]. Occlusion: The occlusion of the dentures appears to be acceptable at this time. Bilateral mandibular posterior crossbite as noted before.  Assessments: 1. Chronic periodontitis 2. Accretions-minimal 3. Gingival recession especially around lingual of #30 4. Atrophy of edentulous alveolar ridges 5. Acceptable upper complete and lower Flexite partial dentures.   Procedures: 1. Periodic oral exam 2. Adult prophylaxis with sonic scaler and selective hand curettes. [place]. Fluoride varnish applied. 3. Evaluation and adjustment of upper complete and lower partial dentures. [place].   Plan:  1. Patient and cargivers are to continue to brush teeth at least twice daily with fluoride toothpaste. Dentures are to be cleaned with soap and water and then soaked overnight with the appropriate denture cleanser.  Patient's mouth is to be cleaned to remove residual denture adhesive at night. 2. Return to clinic for periodic oral examination and dental cleaning in 6 months.  3. Caregiver is to call if problems arise before the next scheduled appointment.   Charlynne Pander, DDS

## 2015-07-08 ENCOUNTER — Encounter (HOSPITAL_COMMUNITY): Payer: Self-pay | Admitting: Dentistry

## 2015-07-08 ENCOUNTER — Ambulatory Visit (HOSPITAL_COMMUNITY): Payer: Medicaid - Dental | Admitting: Dentistry

## 2015-07-08 VITALS — BP 118/65 | HR 78 | Temp 98.8°F

## 2015-07-08 DIAGNOSIS — K08409 Partial loss of teeth, unspecified cause, unspecified class: Secondary | ICD-10-CM

## 2015-07-08 DIAGNOSIS — Z972 Presence of dental prosthetic device (complete) (partial): Secondary | ICD-10-CM

## 2015-07-08 DIAGNOSIS — K0820 Unspecified atrophy of edentulous alveolar ridge: Secondary | ICD-10-CM

## 2015-07-08 DIAGNOSIS — M264 Malocclusion, unspecified: Secondary | ICD-10-CM

## 2015-07-08 DIAGNOSIS — K1370 Unspecified lesions of oral mucosa: Secondary | ICD-10-CM

## 2015-07-08 DIAGNOSIS — F89 Unspecified disorder of psychological development: Secondary | ICD-10-CM

## 2015-07-08 DIAGNOSIS — K036 Deposits [accretions] on teeth: Secondary | ICD-10-CM

## 2015-07-08 DIAGNOSIS — K0530 Chronic periodontitis, unspecified: Secondary | ICD-10-CM

## 2015-07-08 DIAGNOSIS — K08109 Complete loss of teeth, unspecified cause, unspecified class: Secondary | ICD-10-CM

## 2015-07-08 DIAGNOSIS — K060 Gingival recession: Secondary | ICD-10-CM

## 2015-07-08 DIAGNOSIS — K0889 Other specified disorders of teeth and supporting structures: Secondary | ICD-10-CM

## 2015-07-08 DIAGNOSIS — K029 Dental caries, unspecified: Secondary | ICD-10-CM

## 2015-07-08 DIAGNOSIS — IMO0002 Reserved for concepts with insufficient information to code with codable children: Secondary | ICD-10-CM

## 2015-07-08 MED ORDER — AMOXICILLIN 500 MG PO CAPS: ORAL_CAPSULE | ORAL | Status: DC

## 2015-07-08 MED ORDER — CHLORHEXIDINE GLUCONATE 0.12 % MT SOLN: OROMUCOSAL | Status: DC

## 2015-07-08 NOTE — Progress Notes (Signed)
07/08/2015  Patient:            Daniel Mitchell Date of Birth:  [birth year] MRN:                [id number]  BP 118/65 mmHg  Pulse 78  Temp(Src) 98.8 F (37.1 C) (Oral)   Daniel Mitchell is a 50 year old male that presents for periodic oral exam, dental cleaning, and adjustment of upper complete and lower partial denture as indicated. Patient, mother, and caregivers deny any acute dental problems.  Premedication: None required  Patient Active Problem List   Diagnosis Date Noted  . 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 Developmental disability   . Seizure disorder (HCC)   . Urogenital disorder     Multiple surgeries; H/O Undescended testicle; nocturnal enuresis  . Ataxia     Demyelinating motor neuropathy  . Hypertension   . Diabetes mellitus type 2, controlled (HCC)   . Depression     History of depression  . Seasonal allergies   . Chronic UTI (urinary tract infection)   . 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 Urethral foreign body 08/05/14    Dr. Lonna Cobb   Past Surgical History  Procedure Laterality Date  . Inguinal hernia repair    . Cystoscopy  04/09/14    Status post cystoscopy and removal of calculus  . Cystoscopy  09/03/14    Status post cystoscopy with removal of calculus    ALLERGIES/ADVERSE DRUG REACTIONS: Allergies  Allergen Reactions  . Topamax [Topiramate]   . Zithromax [Azithromycin]     MEDICATIONS: Current Outpatient Prescriptions  Medication Sig Dispense Refill  . baclofen (LIORESAL) 10 MG tablet Take 10 mg by mouth 3 (three) times daily.    . divalproex (DEPAKOTE) 500 MG DR tablet Take 1,000 mg by mouth 2 (two) times daily.    Marland Kitchen docusate sodium (COLACE) 100 MG capsule Take 100 mg by mouth 2 (two) times daily as needed.     Marland Kitchen escitalopram (LEXAPRO) 20 MG tablet Take 20 mg by mouth 2 (two) times daily.    . ferrous sulfate 325 (65 FE) MG tablet Take 325 mg by mouth daily.    .  fexofenadine (ALLEGRA) 180 MG tablet Take 180 mg by mouth daily.    . metFORMIN (GLUCOPHAGE) 500 MG tablet Take 500 mg by mouth 2 (two) times daily with a meal.    . Olopatadine HCl (PATANASE) 0.6 % SOLN Place into the nose. Nasal spray: Used 2 sprays in each nostril twice daily as needed for nasal congestion.    . paliperidone (INVEGA) 3 MG 24 hr tablet Take 3 mg by mouth every morning.    . pravastatin (PRAVACHOL) 20 MG tablet Take 20 mg by mouth daily.    Marland Kitchen levocetirizine (XYZAL) 5 MG tablet Take 5 mg by mouth at bedtime.    Marland Kitchen lisinopril (PRINIVIL,ZESTRIL) 10 MG tablet Take 10 mg by mouth every morning.    Marland Kitchen lisinopril-hydrochlorothiazide (PRINZIDE,ZESTORETIC) 20-12.5 MG per tablet Take 1 tablet by mouth every morning.    . magnesium hydroxide (MILK OF MAGNESIA) 800 MG/5ML suspension Take 60 mLs by mouth. 60 ML's by mouth every Friday evening    . ranitidine (ZANTAC) 150 MG tablet Take 150 mg by mouth 2 (two) times daily.     No current facility-administered medications for this visit.    C/C: Patient presents with his mother for periodic oral examination, dental cleaning, and evaluation of  upper complete and lower acrylic partial denture.  HPI:  Patient was last seen for exam and cleaning and adjustment of an upper complete and lower partial denture on December 01, 2014. Patient is not complaining of any dental pain by report of mother. No medical changes per mother.  DENTAL EXAM: General: Patient is a developmentally disabled male in no acute distress. The patient was transferred to the dental chair from a wheelchair with aid of caregiver.  Extraoral Exam: There is NO palpable lymphadenopathy. There are no TMJ symptoms. Intraoral  Exam: Patient has excessive saliva. There is significant denture adhesive that is adherent to the maxillary tissues. Beneath the mid palatal portion of the adhesive, a palatal abscess was noted that was palpated and drained of purulent exudate. This left a 3 x 6  mm ulceration. 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 There is gingival recession associated with lower right molar on lingual #30. Tooth #30 now has 1+ tooth mobility.   Dentition: Patient with tooth numbers 18, 19, 30, and 31 remaining. All other teeth are missing. Periodontal: Patient has chronic periodontitis with very minimal plaque accumulations, generalized gingival recession, and incipient mobility of tooth #'s 19 and 1+ mobility of tooth #30. The gingival recession on the lingual aspect of tooth #30 is now more extensive.  No sensitivity reported today. Oral hygiene is Very Good. Adult prophylaxis provided with a sonic scaler and selective hand curettes. Teeth were polished.  Caries: Dental caries were noted on the lingual of #19 at the margin of the crown restoration. The patient also has Mesiobuccal caries on tooth numbers 30 and 31. Endodontic: No acute pulpitis symptoms. There does appear to be periapical pathology associated with the apices of tooth #30. Tooth #30 has had previous root canal therapy. There does appear to be bone loss in the furcation of tooth #30. C&B: Patient has crown the tooth numbers 19 and 30 with recurrent caries on the lingual of #19.  Prosthodontic: Patient has an upper complete denture and a lower Flexite mandibular partial denture. DENTURES ARE CLEANED ACCEPTABLY. Pressure indicating paste was applied to the upper complete and lower partial dentures. Adjustments were made to the upper complete denture and lower flexor site partial denture.  Occlusion: The occlusion of the dentures is less than ideal but appears to be acceptable at this time. Bilateral mandibular posterior crossbite is noted as before.  Assessments: 1. Chronic periodontitis 2. Accretions 3. Generalized gingival recession especially around lingual of #30 4. Tooth mobility  5. Dental caries of tooth numbers 19, 30, and 31. 6. Multiple missing teeth 7. Significant Atrophy of  edentulous alveolar ridges 8. Mid palatal soft tissue lesion/abscess 9. Less than ideal upper complete and lower Flexite partial denture.  Procedures: 1. Periodic oral exam 2. Adult prophylaxis with sonic scaler and selective hand curettes. [place].  3. Periapical radiographs of lower left and lower right molars 4. Evaluation and adjustment of upper complete and lower partial dentures.   Plan:  1. I discussed the risks, benefits, and complications of various treatment options to the patient's mother in relationship to the patient's medical and dental conditions.  We discussed use of amoxicillin 500 mg by mouth every 8 hours for 7 days along with the use of chlorhexidine rinses 3 times daily in a swish and spit manner. These prescriptions were sent to [facility] in [place].  Patient is to keep the upper complete and lower acrylic partial denture out for one week and  return to dental medicine for reevaluation and discussion of other dental treatment options at that time. We discussed referral to an oral surgeon for extraction of tooth #30 with alveoloplasty with consideration for extraction of tooth numbers 18, 19, 30, and 31 with alveoloplasty.  Due to the significant atrophy of the edentulous alveolar ridges, the prognosis for successful ability to wear lower complete denture is very poor. Therefore, I will discuss possible repair of the existing lower partial denture with the dental lab while the patient to keep tooth numbers 18 and 19 and 31. In the event that these teeth are not extracted, patient will need to have dental restoration of tooth numbers 19 on the lingual and 31 on the mesiobuccal. The patient's mother expressed understanding and will return next week on 07/15/2015 at 10:30 AM. 2. [employer] caregivers were provided with orders to keep dentures out of the mouth for [redacted] week along with provision of an order for soft diet consistent with not being able to  wear the dentures at this time. Dentures are to be kept in water in a denture cup in the interim. 3. Patient and caregivers are to continue to brush teeth at least twice daily with fluoride toothpaste.  4. Return to clinic for limited oral examination and other treatment as indicated next week.   5. Caregiver is to call if problems arise before the next scheduled appointment.   Charlynne Panderonald F. Eimy Plaza, DDS

## 2015-07-08 NOTE — Patient Instructions (Addendum)
Plan:  1. I discussed the risks, benefits, and complications of various treatment options to the patient's mother in relationship to the patient's medical and dental conditions.  We discussed use of amoxicillin 500 mg by mouth every 8 hours for 7 days along with the use of chlorhexidine rinses 3 times daily in a swish and spit manner. These prescriptions were sent to [facility] in [place].  Patient is to keep the upper complete and lower acrylic partial denture out for one week and return to dental medicine for reevaluation and discussion of other dental treatment options at that time. We discussed referral to an oral surgeon for extraction of tooth #30 with alveoloplasty with consideration for extraction of tooth numbers 18, 19, 30, and 31 with alveoloplasty.  Due to the significant atrophy of the edentulous alveolar ridges, the prognosis for successful ability to wear lower complete denture is very poor. Therefore, I will discuss possible repair of the existing lower partial denture with the dental lab while the patient to keep tooth numbers 18 and 19 and 31. In the event that these teeth are not extracted, patient will need to have dental restoration of tooth numbers 19 on the lingual and 31 on the mesiobuccal. The patient's mother expressed understanding and will return next week on 07/15/2015 at 10:30 AM. 2. [employer] caregivers were provided with orders to keep dentures out of the mouth for [redacted] week along with provision of an order for soft diet consistent with not being able to wear the dentures at this time. Dentures are to be kept in water in a denture cup in the interim. 3. Patient and caregivers are to continue to brush teeth at least twice daily with fluoride toothpaste.  4. Return to clinic for limited oral examination and other treatment as indicated next week.   5. Caregiver is to call if problems arise before the next scheduled appointment.   Charlynne Panderonald F.  Marchetta Navratil, DDS

## 2015-07-15 ENCOUNTER — Ambulatory Visit (HOSPITAL_COMMUNITY): Payer: Medicaid - Dental | Admitting: Dentistry

## 2015-07-15 ENCOUNTER — Encounter (HOSPITAL_COMMUNITY): Payer: Self-pay | Admitting: Dentistry

## 2015-07-15 VITALS — BP 127/73 | HR 75 | Temp 98.5°F

## 2015-07-15 DIAGNOSIS — F89 Unspecified disorder of psychological development: Secondary | ICD-10-CM

## 2015-07-15 DIAGNOSIS — K08409 Partial loss of teeth, unspecified cause, unspecified class: Secondary | ICD-10-CM

## 2015-07-15 DIAGNOSIS — K0889 Other specified disorders of teeth and supporting structures: Secondary | ICD-10-CM

## 2015-07-15 DIAGNOSIS — K0530 Chronic periodontitis, unspecified: Secondary | ICD-10-CM

## 2015-07-15 DIAGNOSIS — K1370 Unspecified lesions of oral mucosa: Secondary | ICD-10-CM

## 2015-07-15 DIAGNOSIS — IMO0002 Reserved for concepts with insufficient information to code with codable children: Secondary | ICD-10-CM

## 2015-07-15 DIAGNOSIS — K029 Dental caries, unspecified: Secondary | ICD-10-CM

## 2015-07-15 DIAGNOSIS — K0820 Unspecified atrophy of edentulous alveolar ridge: Secondary | ICD-10-CM

## 2015-07-15 DIAGNOSIS — M264 Malocclusion, unspecified: Secondary | ICD-10-CM

## 2015-07-15 DIAGNOSIS — K060 Gingival recession: Secondary | ICD-10-CM

## 2015-07-15 NOTE — Patient Instructions (Signed)
Plan:  1. I discussed the risks, benefits, and complications of various treatment options to the patient's mother in relationship to the patient's medical and dental conditions.  I have made impression of the existing mandibular partial denture and will have [facility] evaluate for the ability to repair the partial denture if tooth numbers 19 and 31 can be restored. Patient is to return to dental medicine next week on Wednesday at 1 PM for dental restoration of tooth numbers 19 and 31 as time and patient cooperation allows. If these teeth can be restored and the lab repair can be completed appropriately, the patient will then be referred to an oral surgeon for extraction of tooth #30. If other teeth can not be restored, all teeth will be extracted by an oral surgeon and patient will require a mandibular complete denture. 2. In the meantime, the patient is to take antibiotics until all gone patient is to continue chlorhexidine rinses 3 times a day. 3. Keep dentures out until next week when patient returns for dental restorations. 4. Chlorhexidine rinses can be decreased to twice a day use, next Wednesday. 5. Patient is to continue soft diet at this time consistent with not being able to wear the dentures at this time. Dentures are to be kept in water in a denture cup in the interim. 6. Patient and caregivers are to continue to brush teeth at least twice daily with fluoride toothpaste.  7. Caregiver is to call if problems arise before the next scheduled appointment.   Charlynne Panderonald F. Kulinski, DDS

## 2015-07-15 NOTE — Progress Notes (Signed)
07/15/2015  Patient:            Daniel OvensCharles B Nation Date of Birth:  10/06/1965 MRN:                [id number]  BP 127/73 mmHg  Pulse 75  Temp(Src) 98.5 F (36.9 C) (Oral)   Briant "Arlys JohnBrian" Duffy RhodyGaither is a 50 year old male that presents for reevaluation of healing of mid palatal soft tissue lesion as well as further discussion of dental treatment options.  Patient, mother, and caregivers deny any acute dental problems.  SUBJECTIVE: Patient, mother, and caregivers deny any problems with patient's mouth. Patient has not been wearing his upper complete and lower acrylic partial denture. Patient has been using the chlorhexidine rinses 3 times daily. Patient is taking amoxicillin antibiotic therapy and will finish his antibiotic therapy tomorrow. Patient has been tolerating his soft diet at this time.  OBJECTIVE: The soft tissue ulceration present in the mid palate has almost completely resolved. No other evidence of abscess formation. Dental caries on tooth numbers 19, 30, 31 as before. Tooth mobility #30 as before.    Assessments: 1. Dental caries involving tooth numbers 19, 30, and 31. 2. Mid palatal soft tissue lesion/abscess-now almost completely resolved. 3. Less than ideal upper complete and lower Flexite partial denture.  Procedures: 1. Limited oral examination oral exam 2. Impression of existing maxillary complete denture. 3. Pick up impression of existing flexite partial denture and remaining teeth in alginate impression.    Plan:  1. I discussed the risks, benefits, and complications of various treatment options to the patient's mother in relationship to the patient's medical and dental conditions.  I have made impression of the existing mandibular partial denture and will have [facility] evaluate for the ability to repair the partial denture if tooth numbers 19 and 31 can be restored. Patient is to return to dental medicine next week on Wednesday at 1 PM for dental restoration of  tooth numbers 19 and 31 as time and patient cooperation allows. If these teeth can be restored and the lab repair can be completed appropriately, the patient will then be referred to an oral surgeon for extraction of tooth #30. If other teeth can not be restored, all teeth will be extracted by an oral surgeon and patient will require a mandibular complete denture. 2. In the meantime, the patient is to take antibiotics until all gone patient is to continue chlorhexidine rinses 3 times a day. 3. Keep dentures out until next week when patient returns for dental restorations. 4. Chlorhexidine rinses can be decreased to twice a day use, next Wednesday. 5. Patient is to continue soft diet at this time consistent with not being able to wear the dentures at this time. Dentures are to be kept in water in a denture cup in the interim. 6. Patient and caregivers are to continue to brush teeth at least twice daily with fluoride toothpaste.  7. Caregiver is to call if problems arise before the next scheduled appointment.   Charlynne Panderonald F. Kulinski, DDS

## 2015-07-22 ENCOUNTER — Encounter (HOSPITAL_COMMUNITY): Payer: Self-pay | Admitting: Dentistry

## 2015-07-22 ENCOUNTER — Ambulatory Visit (HOSPITAL_COMMUNITY): Payer: Medicaid - Dental | Admitting: Dentistry

## 2015-07-22 VITALS — BP 113/70 | HR 84 | Temp 98.3°F

## 2015-07-22 DIAGNOSIS — K1370 Unspecified lesions of oral mucosa: Secondary | ICD-10-CM

## 2015-07-22 DIAGNOSIS — M264 Malocclusion, unspecified: Secondary | ICD-10-CM

## 2015-07-22 DIAGNOSIS — Z972 Presence of dental prosthetic device (complete) (partial): Secondary | ICD-10-CM

## 2015-07-22 DIAGNOSIS — K08409 Partial loss of teeth, unspecified cause, unspecified class: Secondary | ICD-10-CM

## 2015-07-22 DIAGNOSIS — K029 Dental caries, unspecified: Secondary | ICD-10-CM

## 2015-07-22 DIAGNOSIS — F89 Unspecified disorder of psychological development: Secondary | ICD-10-CM

## 2015-07-22 DIAGNOSIS — K0889 Other specified disorders of teeth and supporting structures: Secondary | ICD-10-CM

## 2015-07-22 NOTE — Patient Instructions (Addendum)
Plan: 1. Continue chlorhexidine rinses twice a day at this time. 2. Brush teeth after meals and at bedtime. 3. Will refer to [facility] in [place] for extraction of tooth #30. 4. Patient to return for insertion of partial denture repair after adequate healing from a dental extraction. 5. Maintain soft diet at this time.    Charlynne Panderonald F. Kulinski, DDS

## 2015-07-22 NOTE — Progress Notes (Signed)
07/22/2015  Patient Name:   Roderic OvensCharles B Mehrer Date of Birth:   01/23/1966 Medical Record Number: [id number]  BP 113/70 mmHg  Pulse 84  Temp(Src) 98.3 F (36.8 C) (Oral)  Roderic Ovensharles B Vanleeuwen presents with his mother and caregiver for dental amalgam restorations.  Patient denies having any dental problems at this time. Patient and mother agree to treatment as planned. Exam: Soft tissue lesion involving the mid palate has healed up completely. Dental caries numbers 19 and 31 as per charting.  Procedure:  18 mg of Xylocaine with 0.009 mg of epinephrine via periodontal ligament injection in the area of tooth numbers 19 and 31. D2140 Lingual/Amalgam, Gluma, Tytin alloy. Burnished.  [id number] MB/Amalgam Gluma, Tytin alloy. Burnish. No exposure noted, but informed mother of potential for acute pulpitis symptoms in the future. Adjusted the mandibular flexite acrylic partial denture. Will plan on sending the partial denture to the [facility] for repair at this time with addition of tooth #30 and acrylic repair as indicated.  Reinserted maxillary complete denture at this time.  Patient tolerated the procedure well.  Plan: 1. Continue chlorhexidine rinses twice a day at this time. 2. Brush teeth after meals and at bedtime. 3. Will refer to [facility] in [place] for extraction of tooth #30. 4. Patient to return for insertion of partial denture repair after adequate healing from a dental extraction. 5. Maintain soft diet at this time.    Charlynne Panderonald F. Kao Berkheimer, DDS

## 2015-09-14 ENCOUNTER — Encounter (HOSPITAL_COMMUNITY): Payer: Self-pay | Admitting: Dentistry

## 2015-09-14 ENCOUNTER — Ambulatory Visit (HOSPITAL_COMMUNITY): Payer: Medicaid - Dental | Admitting: Dentistry

## 2015-09-14 VITALS — BP 116/81 | HR 96 | Temp 98.4°F

## 2015-09-14 DIAGNOSIS — Z463 Encounter for fitting and adjustment of dental prosthetic device: Secondary | ICD-10-CM

## 2015-09-14 DIAGNOSIS — Z972 Presence of dental prosthetic device (complete) (partial): Secondary | ICD-10-CM

## 2015-09-14 DIAGNOSIS — K08409 Partial loss of teeth, unspecified cause, unspecified class: Secondary | ICD-10-CM

## 2015-09-14 DIAGNOSIS — Z98811 Dental restoration status: Secondary | ICD-10-CM

## 2015-09-14 DIAGNOSIS — K0889 Other specified disorders of teeth and supporting structures: Secondary | ICD-10-CM

## 2015-09-14 NOTE — Progress Notes (Signed)
POST OPERATIVE NOTE:  09/14/2015 Roderic OvensCharles B Lajara [id number]  VITALS: BP 116/81 mmHg  Pulse 96  Temp(Src) 98.4 F (36.9 C) (Oral)   Roderic Ovensharles B Convey is status post extraction of tooth #30 by Dr. Pascal LuxKane (oral surgeon)  in [place], [place] on 08/27/15. Patient now presents for evaluation of healing and insertion of lower partial denture repair.  SUBJECTIVE: Patient is not complaining of any pain from the extraction site. Patient is eating a soft diet without problems.  EXAM: There is no sign of infection, heme, or ooze. No sutures are present. Patient is healing in from secondary intention especially on the lingual aspect of the alveolar ridge.   PROCEDURE: Pressure indicating paste was applied to the lower partial denture repair. Adjustments were made as needed. Lower partial denture was polished.  ASSESSMENT: Post operative course is consistent with dental procedures performed by the oral surgeon. Extraction site is healing in by secondary intention. Less than ideal retention stability of the lower partial denture.   PLAN: 1. Continue salt water rinses as needed to aid healing. 2. Brush teeth after meals and at bedtime. 3. Due to the need for additional healing from extraction site #30, I will hold onto the partial denture repair for an additional month before giving back to the patient for use.      I will then further adjust lower partial denture as needed. We will entertain additional prosthodontic rehabilitation options for the patient at that time. Patient has decreased prognosis for successful ability to wear the existing lower partial    denture due to decreased retention and stability associated with the atrophy of the edentulous alveolar ridges. 4. Patient to continue soft diet at this time. 5. Patient has been scheduled for dental appointment on 10/13/2015 at 11 AM. Mother to call as needed before then.   Charlynne Panderonald F. Kulinski, DDS

## 2015-10-13 ENCOUNTER — Encounter (HOSPITAL_COMMUNITY): Payer: Self-pay | Admitting: Dentistry

## 2015-10-20 ENCOUNTER — Encounter (HOSPITAL_COMMUNITY): Payer: Self-pay | Admitting: Dentistry

## 2015-10-20 ENCOUNTER — Encounter (INDEPENDENT_AMBULATORY_CARE_PROVIDER_SITE_OTHER): Payer: Self-pay

## 2015-10-20 ENCOUNTER — Ambulatory Visit (HOSPITAL_COMMUNITY): Payer: Medicaid - Dental | Admitting: Dentistry

## 2015-10-20 VITALS — BP 106/65 | HR 70 | Temp 98.6°F

## 2015-10-20 DIAGNOSIS — K083 Retained dental root: Secondary | ICD-10-CM | POA: Diagnosis not present

## 2015-10-20 DIAGNOSIS — K0820 Unspecified atrophy of edentulous alveolar ridge: Secondary | ICD-10-CM

## 2015-10-20 DIAGNOSIS — K08409 Partial loss of teeth, unspecified cause, unspecified class: Secondary | ICD-10-CM

## 2015-10-20 DIAGNOSIS — Z463 Encounter for fitting and adjustment of dental prosthetic device: Secondary | ICD-10-CM | POA: Diagnosis not present

## 2015-10-20 DIAGNOSIS — F89 Unspecified disorder of psychological development: Secondary | ICD-10-CM

## 2015-10-20 NOTE — Progress Notes (Signed)
Limited Oral examination:  10/20/2015 Daniel OvensCharles B Mitchell [id number]  VITALS: BP 106/65 mmHg  Pulse 70  Temp(Src) 98.6 F (37 C) (Oral)   Daniel OvensCharles B Mitchell is status post extraction of tooth #30 by Dr. Shirley MuscatKaine (oral surgeon)  in [place], [place] on 08/27/15. Patient was seen on 09/14/2015 for evaluation of healing and insertion of the lower partial denture repair. Decision was made to keep the lower partial denture out until additional healing from the extraction site had occurred.  The patient now presents for reevaluation of healing and further adjustment of lower partial denture as indicated.  SUBJECTIVE: Patient is not complaining of any pain from the extraction site. Patient is eating a soft diet without problems.  EXAM: There is no sign of infection, heme, or ooze. No sutures are present. The extraction site has healed in almost completely from secondary intention especially on the lingual aspect of the alveolar ridge.   PROCEDURE: Pressure indicating paste was applied to the lower partial denture repair. Adjustments were made as needed. Lower partial denture was polished. Occlusion is less than ideal but acceptable. Lower partial denture has less than ideal retention and stability secondary to loss of one of the abutment teeth for the flexite partial denture.  ASSESSMENT: 1. Multiple missing teeth. 2. Atrophy of the edentulous alveolar ridges. 3. Extraction site #30 has healed in almost completely with secondary intention healing. 4. Less than ideal retention and stability of the lower partial denture.  5. Poor prognosis for the  lower partial denture  PLAN: 1. Brush teeth after meals and at bedtime. 2. Use the poligrip powder as the denture adhesive to assist in retention of the maxillary full denture and lower partial dentures. Will consider use of denture adhesive paste as needed for the lower partial denture. 3. Patient to continue soft diet at this time. 4. Patient has been  scheduled for dental appointment on 11/02/2015 at 12:45 pm. Ideally patient will be seen on 10/28/2015 to evaluate for possible denture irritation but patient schedule will not permit. Mother to call as needed before then.  5. Keep lower partial denture out if denture irritation arises before I can see the patient. 6. Consider referral to the prosthodontist for evaluation for prosthodontics treatment options in light of multiple missing teeth and atrophy of the edentulous alveolar ridges.   Charlynne Panderonald F. Kulinski, DDS

## 2015-10-20 NOTE — Patient Instructions (Signed)
PLAN: 1. Brush teeth after meals and at bedtime. 2. Use the Poligrip powder as the denture adhesive to assist in retention of the maxillary full denture and lower partial dentures. Will consider use of denture adhesive paste as needed for the lower partial denture. 3. Patient to continue soft diet at this time. 4. Patient has been scheduled for dental appointment on 11/02/2015 at 12:45 pm. Ideally patient will be seen on 10/28/2015 to evaluate for possible denture irritation but patient schedule will not permit. Mother to call as needed before then.  5. Keep lower partial denture out if denture irritation arises before I can see the patient. 6. Consider referral to the prosthodontist for evaluation for prosthodontics treatment options in light of multiple missing teeth and atrophy of the edentulous alveolar ridges.   Charlynne Panderonald F. Kulinski, DDS

## 2015-11-02 ENCOUNTER — Encounter (HOSPITAL_COMMUNITY): Payer: Self-pay | Admitting: Dentistry

## 2015-11-09 ENCOUNTER — Encounter (INDEPENDENT_AMBULATORY_CARE_PROVIDER_SITE_OTHER): Payer: Self-pay

## 2015-11-09 ENCOUNTER — Ambulatory Visit (HOSPITAL_COMMUNITY): Payer: Medicaid - Dental | Admitting: Dentistry

## 2015-11-09 ENCOUNTER — Encounter (HOSPITAL_COMMUNITY): Payer: Self-pay | Admitting: Dentistry

## 2015-11-09 VITALS — BP 109/70 | HR 68 | Temp 98.6°F

## 2015-11-09 DIAGNOSIS — M264 Malocclusion, unspecified: Secondary | ICD-10-CM

## 2015-11-09 DIAGNOSIS — K062 Gingival and edentulous alveolar ridge lesions associated with trauma: Secondary | ICD-10-CM

## 2015-11-09 DIAGNOSIS — Z463 Encounter for fitting and adjustment of dental prosthetic device: Secondary | ICD-10-CM

## 2015-11-09 DIAGNOSIS — K08409 Partial loss of teeth, unspecified cause, unspecified class: Secondary | ICD-10-CM

## 2015-11-09 DIAGNOSIS — K0820 Unspecified atrophy of edentulous alveolar ridge: Secondary | ICD-10-CM

## 2015-11-09 DIAGNOSIS — F89 Unspecified disorder of psychological development: Secondary | ICD-10-CM

## 2015-11-09 NOTE — Patient Instructions (Signed)
PLAN: 1. Brush teeth after meals and at bedtime. 2. Use the poligrip powder as the denture adhesive to assist in retention of the maxillary full denture and lower partial dentures.  3. Patient to continue soft diet at this time. 4. Patient has been scheduled for dental appointment on 11/24/15 at 11:15 am. Mother or caregivers to call as needed before then.  5. Keep lower partial denture out if significant denture irritation arises before I can see the patient. 6. Consider referral to the prosthodontist for evaluation for prosthodontics treatment options in light of multiple missing teeth and atrophy of the edentulous alveolar ridges.   Charlynne Panderonald F. Kulinski, DDS

## 2015-11-09 NOTE — Progress Notes (Signed)
Limited Oral Examination:  11/09/2015 Roderic OvensCharles B Kocian [id number]  VITALS: BP 109/70 mmHg  Pulse 68  Temp(Src) 98.6 F (37 C) (Oral)   Roderic OvensCharles B Crite is status post extraction of tooth #30 by Dr. Shirley MuscatKaine (oral surgeon)  in [place], [place] on 08/27/15. Patient was seen on 09/14/2015 for evaluation of healing and insertion of the lower partial denture repair. Decision was made to keep the lower partial denture out until additional healing from the extraction site had occurred.  Patient was seen on 10/20/2015 and adjustments were made to the lower partial denture as needed. The patient now presents for reevaluation of healing and further adjustment of lower partial denture as indicated.  SUBJECTIVE: Patient is not complaining of any pain from the extraction site or from the lower denture. Patient is eating without problems.  EXAM: There is no sign of infection and extraction site is healing in well. There is some denture irritation involving the mandibular anterior facial vestibule area #24.   Copious saliva as before.  PROCEDURE: Pressure indicating paste was applied to the lower partial denture repair. Adjustments were made as needed. Thick PIP to the area #24 on facial. Adjustments made as needed. Lower partial denture was polished. Occlusion is less than ideal but acceptable. Lower partial denture has less than ideal retention and stability secondary to loss of one of the abutment teeth for the flexite partial denture.  ASSESSMENT: 1. Multiple missing teeth. 2. Atrophy of the edentulous alveolar ridges. 3. Extraction site #30 has healed in almost completely with secondary intention healing. 4. Less than ideal retention and stability of the lower partial denture.  5. Denture irritation area #24. 6. Poor prognosis for the  lower partial denture  PLAN: 1. Brush teeth after meals and at bedtime. 2. Use the poligrip powder as the denture adhesive to assist in retention of the  maxillary full denture and lower partial dentures.  3. Patient to continue soft diet at this time. 4. Patient has been scheduled for dental appointment on 11/24/15 at 11:15 am. Mother or caregivers to call as needed before then.  5. Keep lower partial denture out if significant denture irritation arises before I can see the patient. 6. Consider referral to the prosthodontist for evaluation for prosthodontics treatment options in light of multiple missing teeth and atrophy of the edentulous alveolar ridges.   Charlynne Panderonald F. Rion Catala, DDS

## 2015-11-24 ENCOUNTER — Encounter (HOSPITAL_COMMUNITY): Payer: Self-pay | Admitting: Dentistry

## 2015-11-24 ENCOUNTER — Ambulatory Visit (HOSPITAL_COMMUNITY): Payer: Medicaid - Dental | Admitting: Dentistry

## 2015-11-24 VITALS — BP 115/69 | HR 62 | Temp 98.2°F

## 2015-11-24 DIAGNOSIS — Z463 Encounter for fitting and adjustment of dental prosthetic device: Secondary | ICD-10-CM

## 2015-11-24 DIAGNOSIS — K08409 Partial loss of teeth, unspecified cause, unspecified class: Secondary | ICD-10-CM

## 2015-11-24 DIAGNOSIS — F89 Unspecified disorder of psychological development: Secondary | ICD-10-CM

## 2015-11-24 DIAGNOSIS — K062 Gingival and edentulous alveolar ridge lesions associated with trauma: Secondary | ICD-10-CM

## 2015-11-24 NOTE — Progress Notes (Signed)
Limited Oral Examination:  11/24/2015 Daniel Mitchell [id number]  VITALS: BP 115/69   Pulse 62   Temp 98.2 F (36.8 C) (Oral)    Daniel Mitchell is status post extraction of tooth #30 by Dr. Shirley Muscat (oral surgeon)  in [place], [place] on 08/27/15. Patient was seen on 09/14/2015 for evaluation of healing and insertion of the lower partial denture repair. Decision was made to keep the lower partial denture out until additional healing from the extraction site had occurred. Patient was seen on 10/20/2015 and adjustments were made to the lower partial denture as needed. The patient was then seen on a follow-up on 11/09/2015 and additional adjustments were made to the lower denture. The patient now presents for reevaluation of healing and further adjustment of lower partial denture as indicated.  SUBJECTIVE: Patient is not complaining of any denture irritation. Patient is eating without problems.  EXAM: There is no sign of infection and extraction site is healing in well. There is no denture irritation involving the mandibular anterior facial vestibule area #24.  Copious saliva as before.  PROCEDURE: Pressure indicating paste was applied to the lower partial denture repair. NO adjustments were made. Occlusion is less than ideal but acceptable. Lower partial denture has less than ideal retention and stability secondary to loss of one of the abutment teeth for the flexite partial denture.  ASSESSMENT: 1. Multiple missing teeth. 2. Atrophy of the edentulous alveolar ridges. 3. Extraction site #30 has healed in completely with secondary intention healing. 4. Less than ideal retention and stability of the lower partial denture.  5. History of Denture irritation area #24-RESOLVED 6. Poor prognosis for the  lower partial denture  PLAN: 1. Brush teeth after meals and at bedtime. 2. Use the poligrip powder as the denture adhesive to assist in retention of the maxillary full denture and lower partial  dentures.  3. Patient to continue soft diet at this time. 4. Patient has been scheduled for dental recall appointment in October, 2017. Mother or caregivers to call as needed before then.  5. Keep lower partial denture out if significant denture irritation arises before I can see the patient. 6. Consider referral to the prosthodontist for evaluation for prosthodontic treatment options in light of multiple missing teeth and atrophy of the edentulous alveolar ridges.  7. Discontinue Chlorhexidine rinses.   Charlynne Pander, DDS

## 2015-11-24 NOTE — Patient Instructions (Signed)
PLAN: 1. Brush teeth after meals and at bedtime. 2. Use the poligrip powder as the denture adhesive to assist in retention of the maxillary full denture and lower partial dentures.  3. Patient to continue soft diet at this time. 4. Patient has been scheduled for dental recall appointment in October, 2017. Mother or caregivers to call as needed before then.  5. Keep lower partial denture out if significant denture irritation arises before I can see the patient. 6. Consider referral to the prosthodontist for evaluation for prosthodontic treatment options in light of multiple missing teeth and atrophy of the edentulous alveolar ridges.  7. Discontinue Chlorhexidine rinses.   Charlynne Pander, DDS

## 2015-12-08 ENCOUNTER — Other Ambulatory Visit: Payer: Self-pay | Admitting: Internal Medicine

## 2015-12-08 DIAGNOSIS — R1310 Dysphagia, unspecified: Secondary | ICD-10-CM

## 2015-12-22 ENCOUNTER — Ambulatory Visit
Admission: RE | Admit: 2015-12-22 | Discharge: 2015-12-22 | Disposition: A | Discharge status: Home / Self Care | Payer: Medicare Other | Source: Ambulatory Visit | Attending: Internal Medicine | Admitting: Internal Medicine

## 2015-12-22 DIAGNOSIS — R1310 Dysphagia, unspecified: Secondary | ICD-10-CM

## 2015-12-22 NOTE — Therapy (Addendum)
[facility]OLOG[facility] [address] [place], [place], [address] Phone: [phone]   Fax:     Modified Barium Swallow  Patient Details  Name: Daniel Mitchell MRN: [id number] Date of Birth: 18-Sep-1965 No Data Recorded  Encounter Date: 12/22/2015   Subjective: Patient behavior: (alertness, ability to follow instructions, etc.):  Caregiver present w/ pt described deficits during the meals as impulsive eating behavior stating pt tended to eat "hunched over his plate" and often put "too much food in his mouth". She endorsed signs of runny nose and watery eyes as meals continued. Pt has an upper denture plate but ill-fitting lower denture plate therefore he often goes w/out wearing the lower denture/partial plate. Suspect pt is not fully, and efficiently, masticating solid foods when not wearing the lower denture plate.  Caregiver stated pt eats "soft foods cut up" and "regular foods" also which is when he has "more trouble".  Pt requires moderate cues for follow through w/ instruction (baseline mental retardation per chart).   Chief complaint: dysphagia   Objective:  Radiological Procedure: A videoflouroscopic evaluation of oral-preparatory, reflex initiation, and pharyngeal phases of the swallow was performed; as well as a screening of the upper esophageal phase.  I. POSTURE: upright II. VIEW: lateral III. COMPENSATORY STRATEGIES: cough/throat clear; small, single sips  IV. BOLUSES ADMINISTERED:  Thin Liquid: 5 trials  Nectar-thick Liquid: 3 trials  Honey-thick Liquid: NT   Puree: 2 trials  Mechanical Soft: 1 trial V. RESULTS OF EVALUATION: A. ORAL PREPARATORY PHASE: (The lips, tongue, and velum are observed for strength and coordination)       **Overall Severity Rating: MILD-MODERATE. With trials of puree/mech soft foods given, pt exhibited min+ increased mastication time/effort b/f transfer and oral clearing(moreso the cracker).  Premature spillage noted w/ trials of thin liquids indicating loss of bolus control; less w/ Nectar liquid trials. Min oral residue noted inconsistently w/ boluses; pt was able to clear this given time b/t bites/sips - suspect min decreased oral awareness in general.   B. SWALLOW INITIATION/REFLEX: (The reflex is normal if "triggered" by the time the bolus reached the base of the tongue)  **Overall Severity Rating: MODERATE. Pt exhibited a moderately delayed pharyngeal swallow initiation w/ trials of thin liquids; pharyngeal swallow appeared to occur as the majority of the bolus filled the pyriform sinuses. A more timely pharyngeal swallow was noted w/ Nectar consistency liquids and purees/solids(valleculae).    C. PHARYNGEAL PHASE: (Pharyngeal function is normal if the bolus shows rapid, smooth, and continuous transit through the pharynx and there is no pharyngeal residue after the swallow)  **Overall Severity Rating: [facility]. Pt appeared to clear the majority of bolus material w/ the swallow; no significant pharyngeal residue remained indicating adequate pharyngeal pressure and laryngeal excursion during the swallow.   D. LARYNGEAL PENETRATION: (Material entering into the laryngeal inlet/vestibule but not aspirated): 2 trials of thin liquids - both SILENT. E. ASPIRATION: 2 trials of thin liquids (1 episode occurred from the buildup of laryngeal penetration from previous trial) - both SILENT. F. ESOPHAGEAL PHASE: (Screening of the upper esophagus): no dysmotility noted in the upper, cervical esophagus  ASSESSMENT:  Pt appeared to presented w/ moderate oropharyngeal phase dysphagia during this evaluation today. Pt exhibited a moderately delayed pharyngeal swallow initiation w/ all trial consistencies but most significantly so w/ trials of thin liquids. The pharyngeal swallow initiation w/ thin liquid trials occurred as the bolus material filled the pyriform sinuses. A more timely pharyngeal swallow at the  level of the valleculae was noted w/ purees/soft solids; spilling from the valleculae w/ Nectar consistency liquids. This delay in airway closure reduces protection of the airway. Both laryngeal Penetration and Aspiration occurred - both SILENT in nature. Pt was able to use a (cued) cough to clear penetrated material in the laryngeal vestibule 1/2x. The SILENT nature of the Penetration/Aspiration indicates decreased laryngeal sensation.  During the oral phase, pt exhibited increased oral phase time for mastication and organization of increased textured foods - suspect impacted by his lacking lower dentition(molars). When foods were moistened well and SINGLE, SMALL bites given, pt exhibited increased bolus control and oral clearing was fairly timely and appropriate. Premature spillage was noted w/ liquid trials indicating reduced oral control/organization during swallowing.  Finishing the swallow, pt exhibited adequate pharyngeal clearing w/ no significant pharyngeal residue indicating appropriate pharyngeal pressure and laryngeal excursion during swallowing.  Pt is at increased risk for Pulmonary issues in light of the laryngeal Penetration and Aspiration (silent in nature) noted during the study today.   PLAN/RECOMMENDATIONS:  A. Diet: Dysphagia 3; Nectar consistency liquids via Cup.   B. Swallowing Precautions: Aspiration Precautions to include SMALLER, SINGLE bites and sips; clearing mouth fully before taking another bite/sip; alternating foods/liquids or other moister foods to aid oral clearing; minimizing distractions during meals(talking); eating SLOWLY during meals. Use of the COUGH/THROAT CLEAR strategy intermittently during and at end of meals to aid clearing of any potential laryngeal penetration.   C. Recommended consultation to: Speech Therapy services for f/u w/ education on aspiration precautions and strategy; education w/ caregivers and staff on precautions and strategies and monitoring pt's  meal environment; choosing of foods/diet appropriate for pt's mastication abilities.  D. Therapy recommendations: Speech Therapy services  E. Results and recommendations were discussed w/ pt and Caregiver present w/ pt. Video viewed but unsure of pt's level of comprehension of material discussed.       End of Session - 01-02-16 1628    Visit Number 1   Number of Visits 1   Date for SLP Re-Evaluation 2016/01/02   SLP Start Time 1300   SLP Stop Time  1400   SLP Time Calculation (min) 60 min   Activity Tolerance Patient tolerated treatment well      Past Medical History:  Diagnosis Date  . Ataxia    Demyelinating motor neuropathy  . Chronic UTI (urinary tract infection)   . Depression    History of depression  . Developmental disability   . Diabetes mellitus type 2, controlled (HCC)   . Hypertension   . 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 Seasonal allergies   . Seizure disorder (HCC)   . Urethral foreign body 08/05/14   Dr. Lonna Cobb  . Urogenital disorder    Multiple surgeries; H/O Undescended testicle; nocturnal enuresis    Past Surgical History:  Procedure Laterality Date  . CYSTOSCOPY  04/09/14   Status post cystoscopy and removal of calculus  . CYSTOSCOPY  09/03/14   Status post cystoscopy with removal of calculus  . INGUINAL HERNIA REPAIR      There were no vitals filed for this visit.             Patient will benefit from skilled therapeutic intervention in order to improve the following deficits and impairments:   Dysphagia - Plan: DG OP Swallowing Func-Medicare/Speech Path, DG OP Swallowing Func-Medicare/Speech Path      G-Codes - 01/02/16 1628    Functional Assessment Tool Used clinical judgement   Functional Limitations Swallowing  Swallow Current Status [phone](G8996) At least 20 percent but less than 40 percent impaired, limited or restricted   Swallow Goal Status [phone](G8997) At least 20 percent but less than 40 percent impaired, limited or restricted   Swallow  Discharge Status [phone](G8998) At least 20 percent but less than 40 percent impaired, limited or restricted          Problem List Patient Active Problem List   Diagnosis Date Noted  . 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

## 2016-02-03 DIAGNOSIS — F09 Unspecified mental disorder due to known physiological condition: Secondary | ICD-10-CM

## 2016-02-09 ENCOUNTER — Ambulatory Visit (HOSPITAL_COMMUNITY): Payer: Medicaid - Dental | Admitting: Dentistry

## 2016-02-09 ENCOUNTER — Encounter (INDEPENDENT_AMBULATORY_CARE_PROVIDER_SITE_OTHER): Payer: Self-pay

## 2016-02-09 ENCOUNTER — Encounter (HOSPITAL_COMMUNITY): Payer: Self-pay | Admitting: Dentistry

## 2016-02-09 VITALS — BP 112/67 | HR 65 | Temp 98.4°F

## 2016-02-09 DIAGNOSIS — K0530 Chronic periodontitis, unspecified: Secondary | ICD-10-CM

## 2016-02-09 DIAGNOSIS — K036 Deposits [accretions] on teeth: Secondary | ICD-10-CM

## 2016-02-09 DIAGNOSIS — K08409 Partial loss of teeth, unspecified cause, unspecified class: Secondary | ICD-10-CM

## 2016-02-09 DIAGNOSIS — Z8669 Personal history of other diseases of the nervous system and sense organs: Secondary | ICD-10-CM

## 2016-02-09 DIAGNOSIS — F89 Unspecified disorder of psychological development: Secondary | ICD-10-CM

## 2016-02-09 DIAGNOSIS — K08109 Complete loss of teeth, unspecified cause, unspecified class: Secondary | ICD-10-CM

## 2016-02-09 DIAGNOSIS — Z972 Presence of dental prosthetic device (complete) (partial): Secondary | ICD-10-CM

## 2016-02-09 DIAGNOSIS — K0820 Unspecified atrophy of edentulous alveolar ridge: Secondary | ICD-10-CM

## 2016-02-09 DIAGNOSIS — M264 Malocclusion, unspecified: Secondary | ICD-10-CM

## 2016-02-09 DIAGNOSIS — K0859 Other unsatisfactory restoration of tooth: Secondary | ICD-10-CM | POA: Diagnosis not present

## 2016-02-09 DIAGNOSIS — K0889 Other specified disorders of teeth and supporting structures: Secondary | ICD-10-CM

## 2016-02-09 NOTE — Patient Instructions (Signed)
Plan:  1. I discussed the risks, benefits, and complications of various treatment options to the patient's mother in relationship to the patient's medical and dental conditions.  We proceeded with exam, adult prophylaxis, and MB/amalgam #31 today. 2. Patient and caregivers are to continue to brush teeth at least twice daily with fluoride toothpaste.  3. Return to clinic for exam and cleaning in 6 months as scheduled.  4. Caregiver or Mother to call if problems arise before the next scheduled appointment.   Charlynne Panderonald F. Kulinski, DDS

## 2016-02-09 NOTE — Progress Notes (Signed)
02/09/2016  Patient:            Daniel Mitchell Date of Birth:  12/12/1965 MRN:                [id number]  BP 112/67 (BP Location: Left Arm)   Pulse 65   Temp 98.4 F (36.9 C) (Oral)   Daniel Mitchell is a 50 year old male that presents for periodic oral exam, dental cleaning, and adjustment of upper complete and lower partial denture as indicated. Patient, mother, and caregivers deny any acute dental problems. Patient recently had a swallowing study that indicated that patient required nectar thick liquids and ground food as part of his diet consistency.  The patient was also recently diagnosed with a left external ear infection that is being treated with Levaquin antibiotic therapy.   Premedication: None required  Patient Active Problem List   Diagnosis Date Noted  . History of external ear infection 02/09/2016  . 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 Ataxia    Demyelinating motor neuropathy  . Chronic UTI (urinary tract infection)   . Depression    History of depression  . Developmental disability   . Diabetes mellitus type 2, controlled (HCC)   . Hypertension   . 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 Seasonal allergies   . Seizure disorder (HCC)   . Urethral foreign body 08/05/14   Dr. Lonna CobbStoioff  . Urogenital disorder    Multiple surgeries; H/O Undescended testicle; nocturnal enuresis   Past Surgical History:  Procedure Laterality Date  . CYSTOSCOPY  04/09/14   Status post cystoscopy and removal of calculus  . CYSTOSCOPY  09/03/14   Status post cystoscopy with removal of calculus  . INGUINAL HERNIA REPAIR      ALLERGIES/ADVERSE DRUG REACTIONS: Allergies  Allergen Reactions  . Topamax [Topiramate]   . Zithromax [Azithromycin]     MEDICATIONS: Current Outpatient Prescriptions  Medication Sig Dispense Refill  . levofloxacin (LEVAQUIN) 500 MG tablet Take 500 mg by mouth daily.    . baclofen (LIORESAL) 10  MG tablet Take 10 mg by mouth 3 (three) times daily.    . chlorhexidine (PERIDEX) 0.12 % solution Use as directed 15 mLs in the mouth or throat 2 (two) times daily. Reported on 11/09/2015    . divalproex (DEPAKOTE) 500 MG DR tablet Take 1,000 mg by mouth 2 (two) times daily.    Marland Kitchen. docusate sodium (COLACE) 100 MG capsule Take 100 mg by mouth 2 (two) times daily as needed.     Marland Kitchen. escitalopram (LEXAPRO) 20 MG tablet Take 20 mg by mouth 2 (two) times daily.    . ferrous sulfate 325 (65 FE) MG tablet Take 325 mg by mouth daily.    . fexofenadine (ALLEGRA) 180 MG tablet Take 180 mg by mouth daily.    . metFORMIN (GLUCOPHAGE) 500 MG tablet Take 500 mg by mouth 2 (two) times daily with a meal.    . nystatin (MYCOSTATIN) powder Apply topically 2 (two) times daily.    . Olopatadine HCl (PATANASE) 0.6 % SOLN Place into the nose. Nasal spray: Used 2 sprays in each nostril twice daily as needed for nasal congestion.    . paliperidone (INVEGA) 3 MG 24 hr tablet Take 3 mg by mouth every morning.    . pravastatin (PRAVACHOL) 20 MG tablet Take 20 mg by mouth daily.    . propranolol (INDERAL) 60 MG tablet Take 60 mg by mouth  daily.    . risperiDONE (RISPERDAL M-TABS) 1 MG disintegrating tablet Take 2 mg by mouth. Dissolve 1 tablet under tongue as needed for agitation. May repeat in 20 minutes.     No current facility-administered medications for this visit.     C/C: Patient presents with his mother for periodic oral examination, dental cleaning, and evaluation of upper complete and lower acrylic partial denture.  HPI:  Patient was last seen for exam and cleaning and adjustment of an upper complete and lower partial denture on November 24, 2015. Patient is not complaining of any dental pain by report of mother. Medical changes as above.  DENTAL EXAM: General: Patient is a developmentally disabled male in no acute distress. The patient was transferred to the dental chair from a wheelchair with aid of caregiver.   Extraoral Exam: There is left neck fullness consistent with recent diagnosis of left external ear infection. No tenderness to palpation. There are no TMJ symptoms. Intraoral  Exam: Patient has excessive saliva. There is some residual denture adhesive that is adherent to the maxillary tissues. There is no evidence of any palatal irritation or soft tissue lesion. 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 There is gingival recession associated with lower right and left molars. Tooth #19 now has Class I tooth mobility.   Dentition: Patient with tooth numbers 18, 19, and 31 remaining. All other teeth are missing. Periodontal: Patient has chronic periodontitis with very minimal plaque accumulations, generalized gingival recession, and Class I mobility of tooth #'s 19. No sensitivity reported today.  Oral hygiene is Very Good.  Adult prophylaxis provided with a sonic scaler and selective hand curettes. Teeth were polished.  Caries/Restorations: Dental restoration on MB of #31 was removed with polishing and was replaced today. No local anesthesia required. [id number] #31 MB/amalgam. Increased retention added with 1/4 round bur. No gluma, Tytin alloy. Burnish. The patient tolerated the procedure well Endodontic: No acute pulpitis symptoms.  C&B: Patient has Full Gold crown the tooth number 19.  Prosthodontic: Patient has an upper complete denture and a lower Flexite mandibular partial denture.  DENTURES ARE CLEANED ACCEPTABLY. Occlusion: The occlusion of the dentures is less than ideal but appears to be acceptable at this time. Bilateral mandibular posterior crossbite is noted as before.  Assessments: 1. Chronic periodontitis 2. Accretions 3. Generalized gingival recession 4. Tooth mobility #19 5. Suboptimal restorations number 31 6. Multiple missing teeth 7. Significant Atrophy of edentulous alveolar ridges 8. Less than ideal upper complete and lower Flexite partial  denture.  Procedures: 1. Periodic oral exam 2. Adult prophylaxis with sonic scaler and selective hand curettes. [place].  3. MB/Amalgam #31.   Plan:  1. I discussed the risks, benefits, and complications of various treatment options to the patient's mother in relationship to the patient's medical and dental conditions.  We proceeded with exam, adult prophylaxis, and MB/amalgam #31 today. 2. Patient and caregivers are to continue to brush teeth at least twice daily with fluoride toothpaste.  3. Return to clinic for exam and cleaning in 6 months as scheduled.  4. Caregiver or Mother to call if problems arise before the next scheduled appointment.   Charlynne Pander, DDS

## 2016-03-09 ENCOUNTER — Emergency Department: Payer: Medicare Other

## 2016-03-09 ENCOUNTER — Emergency Department
Admission: EM | Admit: 2016-03-09 | Discharge: 2016-03-09 | Disposition: A | Discharge status: Home / Self Care | Payer: Medicare Other | Attending: Emergency Medicine | Admitting: Emergency Medicine

## 2016-03-09 DIAGNOSIS — E119 Type 2 diabetes mellitus without complications: Secondary | ICD-10-CM | POA: Diagnosis not present

## 2016-03-09 DIAGNOSIS — R531 Weakness: Secondary | ICD-10-CM

## 2016-03-09 DIAGNOSIS — I10 Essential (primary) hypertension: Secondary | ICD-10-CM | POA: Diagnosis not present

## 2016-03-09 DIAGNOSIS — Z7984 Long term (current) use of oral hypoglycemic drugs: Secondary | ICD-10-CM | POA: Diagnosis not present

## 2016-03-09 DIAGNOSIS — Z79899 Other long term (current) drug therapy: Secondary | ICD-10-CM | POA: Insufficient documentation

## 2016-03-09 DIAGNOSIS — J189 Pneumonia, unspecified organism: Secondary | ICD-10-CM | POA: Insufficient documentation

## 2016-03-09 LAB — CBC WITH DIFFERENTIAL/PLATELET
BASOS ABS: 0 10*3/uL (ref 0–0.1)
BASOS PCT: 0 %
EOS ABS: 0.1 10*3/uL (ref 0–0.7)
Eosinophils Relative: 2 %
HCT: 38.7 % — ABNORMAL LOW (ref 40.0–52.0)
HEMOGLOBIN: 13 g/dL (ref 13.0–18.0)
Lymphocytes Relative: 33 %
Lymphs Abs: 1.8 10*3/uL (ref 1.0–3.6)
MCH: 32.5 pg (ref 26.0–34.0)
MCHC: 33.6 g/dL (ref 32.0–36.0)
MCV: 96.9 fL (ref 80.0–100.0)
MONOS PCT: 17 %
Monocytes Absolute: 0.9 10*3/uL (ref 0.2–1.0)
NEUTROS ABS: 2.6 10*3/uL (ref 1.4–6.5)
NEUTROS PCT: 48 %
Platelets: 115 10*3/uL — ABNORMAL LOW (ref 150–440)
RBC: 3.99 MIL/uL — ABNORMAL LOW (ref 4.40–5.90)
RDW: 14.1 % (ref 11.5–14.5)
WBC: 5.5 10*3/uL (ref 3.8–10.6)

## 2016-03-09 LAB — URINALYSIS COMPLETE WITH MICROSCOPIC (ARMC ONLY)
BILIRUBIN URINE: NEGATIVE
Bacteria, UA: NONE SEEN
GLUCOSE, UA: NEGATIVE mg/dL
HGB URINE DIPSTICK: NEGATIVE
LEUKOCYTES UA: NEGATIVE
NITRITE: NEGATIVE
Protein, ur: NEGATIVE mg/dL
SPECIFIC GRAVITY, URINE: 1.014 (ref 1.005–1.030)
pH: 6 (ref 5.0–8.0)

## 2016-03-09 LAB — BASIC METABOLIC PANEL
ANION GAP: 8 (ref 5–15)
BUN: 11 mg/dL (ref 6–20)
CALCIUM: 9.1 mg/dL (ref 8.9–10.3)
CO2: 30 mmol/L (ref 22–32)
CREATININE: 0.72 mg/dL (ref 0.61–1.24)
Chloride: 101 mmol/L (ref 101–111)
GFR calc non Af Amer: >60 mL/min (ref >60)
Glucose, Bld: 137 mg/dL — ABNORMAL HIGH (ref 65–99)
Potassium: 4.1 mmol/L (ref 3.5–5.1)
SODIUM: 139 mmol/L (ref 135–145)

## 2016-03-09 LAB — RAPID INFLUENZA A&B ANTIGENS
Influenza A (ARMC): NEGATIVE
Influenza B (ARMC): NEGATIVE

## 2016-03-09 MED ORDER — LEVOFLOXACIN 750 MG PO TABS: 750.0000 mg | ORAL_TABLET | Freq: Every day | ORAL | 0 refills | Status: AC

## 2016-03-09 MED ORDER — SODIUM CHLORIDE 0.9 % IV BOLUS (SEPSIS)
1000.0000 mL | Freq: Once | INTRAVENOUS | Status: AC
  Administered 2016-03-09: 1000 mL via INTRAVENOUS

## 2016-03-09 MED ORDER — LEVOFLOXACIN IN D5W 750 MG/150ML IV SOLN
750.0000 mg | Freq: Once | INTRAVENOUS | Status: AC
Start: 2016-03-09 — End: 2016-03-09
  Administered 2016-03-09: 750 mg via INTRAVENOUS
  Filled 2016-03-09: qty 150

## 2016-03-09 MED ORDER — KETOROLAC TROMETHAMINE 30 MG/ML IJ SOLN
30.0000 mg | Freq: Once | INTRAMUSCULAR | Status: AC
  Administered 2016-03-09: 30 mg via INTRAVENOUS
  Filled 2016-03-09: qty 1

## 2016-03-09 MED ORDER — METOCLOPRAMIDE HCL 5 MG/ML IJ SOLN
10.0000 mg | Freq: Once | INTRAMUSCULAR | Status: AC
  Administered 2016-03-09: 10 mg via INTRAVENOUS
  Filled 2016-03-09: qty 2

## 2016-03-09 NOTE — ED Triage Notes (Signed)
[facility] is [facility] with c/o generalized weakness for the past 2 days, was seen at Dr,. Anderson's office yesterday and had labs done..Marland Kitchen

## 2016-03-09 NOTE — ED Provider Notes (Signed)
[facility] Emergency Department Provider Note  ____________________________________________  Time seen: Approximately 3:20 PM  I have reviewed the triage vital signs and the nursing notes.   HISTORY  Chief Complaint Weakness  Level 5 caveat:  Portions of the history and physical were unable to be obtained due to developmental disability   HPI Daniel Mitchell is a 50 y.o. male with a history of developmental disability, chronic UTIs, diabetes, seizure disorderwho presents for evaluation of generalized weakness. Patient is accompanied by staff from his group home and also by his mother. Patient has had 3-4 days of generalized weakness. He saw his primary care doctor yesterday and blood work showed a normal white count, normal kidney function, and a UA concerning for a urinary tract infection. Patient was given a dose of Rocephin. Patient was supposed follow-up with his urologist tomorrow however the urologist had to cancel the visit. The family brought him over here today because they did not have a follow-up tomorrow anymore. Patient is not worse than he was yesterday when seen by primary care doctor. Patient has also had congestion and runny nose for the last week. No fever. Vaccines including flu shot are up to date.  Mildly reduced appetite but still eating and drinking. Patient reports HA and body aches. Denies CP, SOB, abdominal pain, or nausea.   Past Medical History:  Diagnosis Date  . Ataxia    Demyelinating motor neuropathy  . Chronic UTI (urinary tract infection)   . Depression    History of depression  . Developmental disability   . Diabetes mellitus type 2, controlled (HCC)   . Hypertension   . 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 Seasonal allergies   . Seizure disorder (HCC)   . Urethral foreign body 08/05/14   Dr. Lonna Cobb  . Urogenital disorder    Multiple surgeries; H/O Undescended testicle; nocturnal enuresis    Patient Active Problem List     Diagnosis Date Noted  . History of external ear infection 02/09/2016  . 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 CYSTOSCOPY  04/09/14   Status post cystoscopy and removal of calculus  . CYSTOSCOPY  09/03/14   Status post cystoscopy with removal of calculus  . INGUINAL HERNIA REPAIR      Prior to Admission medications   Medication Sig Start Date End Date Taking? Authorizing Provider  baclofen (LIORESAL) 10 MG tablet Take 10 mg by mouth 3 (three) times daily.    Historical Provider, MD  chlorhexidine (PERIDEX) 0.12 % solution Use as directed 15 mLs in the mouth or throat 2 (two) times daily. Reported on 11/09/2015    Historical Provider, MD  divalproex (DEPAKOTE) 500 MG DR tablet Take 1,000 mg by mouth 2 (two) times daily.    Historical Provider, MD  docusate sodium (COLACE) 100 MG capsule Take 100 mg by mouth 2 (two) times daily as needed.     Historical Provider, MD  escitalopram (LEXAPRO) 20 MG tablet Take 20 mg by mouth 2 (two) times daily.    Historical Provider, MD  ferrous sulfate 325 (65 FE) MG tablet Take 325 mg by mouth daily.    Historical Provider, MD  fexofenadine (ALLEGRA) 180 MG tablet Take 180 mg by mouth daily.    Historical Provider, MD  levofloxacin (LEVAQUIN) 750 MG tablet Take 1 tablet (750 mg total) by mouth daily. 03/09/16 03/16/16  Nita Sickle, MD  metFORMIN (GLUCOPHAGE) 500 MG tablet Take 500 mg by mouth  2 (two) times daily with a meal.    Historical Provider, MD  nystatin (MYCOSTATIN) powder Apply topically 2 (two) times daily.    Historical Provider, MD  Olopatadine HCl (PATANASE) 0.6 % SOLN Place into the nose. Nasal spray: Used 2 sprays in each nostril twice daily as needed for nasal congestion.    Historical Provider, MD  paliperidone (INVEGA) 3 MG 24 hr tablet Take 3 mg by mouth every morning.    Historical Provider, MD  pravastatin (PRAVACHOL) 20 MG tablet Take 20 mg by mouth daily.    Historical  Provider, MD  propranolol (INDERAL) 60 MG tablet Take 60 mg by mouth daily.    Historical Provider, MD  risperiDONE (RISPERDAL M-TABS) 1 MG disintegrating tablet Take 2 mg by mouth. Dissolve 1 tablet under tongue as needed for agitation. May repeat in 20 minutes.    Historical Provider, MD    Allergies Topamax [topiramate] and Zithromax [azithromycin]  Family History  Problem Relation Age of Onset  . Stroke Father     Social History Social History  Substance Use Topics  . Smoking status: Never Smoker  . Smokeless tobacco: Never Used  . Alcohol use No    Review of Systems  Constitutional: Negative for fever. + Generalized weakness and malaise Eyes: Negative for visual changes. ENT: Negative for sore throat. Cardiovascular: Negative for chest pain. Respiratory: Negative for shortness of breath. Gastrointestinal: Negative for abdominal pain, vomiting or diarrhea. Genitourinary: Negative for dysuria. Musculoskeletal: Negative for back pain. Skin: Negative for rash. Neurological: Negative for headaches, weakness or numbness.  ____________________________________________   PHYSICAL EXAM:  VITAL SIGNS: Vitals:   03/09/16 1700 03/09/16 1730  BP: 114/66 130/75  Pulse: 72 63  Resp:    Temp:     Constitutional: Alert and oriented. Well appearing and in no apparent distress. HEENT:      Head: Normocephalic and atraumatic.         Eyes: Conjunctivae are normal. Sclera is non-icteric. EOMI. PERRL      Mouth/Throat: Mucous membranes are moist.       Neck: Supple with no signs of meningismus. Cardiovascular: Regular rate and rhythm. No murmurs, gallops, or rubs. 2+ symmetrical distal pulses are present in all extremities. No JVD. Respiratory: Normal respiratory effort. Lungs are clear to auscultation bilaterally. No wheezes, crackles, or rhonchi.  Gastrointestinal: Soft, non tender, and non distended with positive bowel sounds. No rebound or guarding. Musculoskeletal: Trace  pitting edema bilaterally Neurologic: Normal speech and language. Face is symmetric. Moving all extremities. No gross focal neurologic deficits are appreciated. Skin: Skin is warm, dry and intact. No rash noted. Psychiatric: Mood and affect are normal. Speech and behavior are normal.  ____________________________________________   LABS (all labs ordered are listed, but only abnormal results are displayed)  Labs Reviewed  CBC WITH DIFFERENTIAL/PLATELET - Abnormal; Notable for the following:       Result Value   RBC 3.99 (*)    HCT 38.7 (*)    Platelets 115 (*)    All other components within normal limits  BASIC METABOLIC PANEL - Abnormal; Notable for the following:    Glucose, Bld 137 (*)    All other components within normal limits  URINALYSIS COMPLETEWITH MICROSCOPIC ([facility] ONLY) - Abnormal; Notable for the following:    Color, Urine YELLOW (*)    APPearance CLEAR (*)    Ketones, ur TRACE (*)    Squamous Epithelial / LPF 0-5 (*)    All other components within normal limits  RAPID INFLUENZA A&B ANTIGENS ([facility] ONLY)  URINE CULTURE   ____________________________________________  EKG  ED ECG REPORT I, Nita Sicklearolina Lamorris Knoblock, the attending physician, personally viewed and interpreted this ECG.  Normal sinus rhythm, rate of 71, normal intervals, normal axis, no ST elevations or depressions. Unchanged from prior ____________________________________________  RADIOLOGY  CXR: Ill-defined patchy opacity at the left lung base, may be atelectasis, pneumonia or aspiration. ____________________________________________   PROCEDURES  Procedure(s) performed: None Procedures Critical Care performed:  None ____________________________________________   INITIAL IMPRESSION / ASSESSMENT AND PLAN / ED COURSE   50 y.o. male with a history of developmental disability, chronic UTIs, diabetes, seizure disorderwho presents for evaluation of generalized weakness x 3-4 days. Patient given  rocephin by PCP yesterday for UTI and had f/u appointment tomorrow cancelled by MD. Will check CBC, BMP, UA, U cx. Flu swab. CXR to rule out PNA. Will give another round of rocephin, IVF, IV toradol.   Clinical Course as of Mar 10 1807  Wed Mar 09, 2016  1641 CXR concerning for PNA. UA is clear here. Will treat with levaquin for CA-PNA and possibly UTI seen yesterday on UA done by PCP. Flu is pending. WBC normal, no hypoxia, no fever. NO indication for inpatient admission  [CV]    Clinical Course User Index [CV] Nita Sicklearolina Loyda Costin, MD    Pertinent labs & imaging results that were available during my care of the patient were reviewed by me and considered in my medical decision making (see chart for details).    ____________________________________________   FINAL CLINICAL IMPRESSION(S) / ED DIAGNOSES  Final diagnoses:  Generalized weakness  Community acquired pneumonia, unspecified laterality      NEW MEDICATIONS STARTED DURING THIS VISIT:  New Prescriptions   LEVOFLOXACIN (LEVAQUIN) 750 MG TABLET    Take 1 tablet (750 mg total) by mouth daily.     Note:  This document was prepared using Dragon voice recognition software and may include unintentional dictation errors.    Nita Sicklearolina Christon Gallaway, MD 03/09/16 [id number]

## 2016-03-11 LAB — URINE CULTURE

## 2016-03-19 ENCOUNTER — Emergency Department: Payer: Medicare Other

## 2016-03-19 ENCOUNTER — Encounter: Payer: Self-pay | Admitting: Emergency Medicine

## 2016-03-19 ENCOUNTER — Emergency Department
Admission: EM | Admit: 2016-03-19 | Discharge: 2016-03-19 | Disposition: A | Discharge status: Home / Self Care | Payer: Medicare Other | Attending: Student in an Organized Health Care Education/Training Program | Admitting: Student in an Organized Health Care Education/Training Program

## 2016-03-19 DIAGNOSIS — R270 Ataxia, unspecified: Secondary | ICD-10-CM | POA: Diagnosis not present

## 2016-03-19 DIAGNOSIS — I10 Essential (primary) hypertension: Secondary | ICD-10-CM | POA: Diagnosis not present

## 2016-03-19 DIAGNOSIS — Z79899 Other long term (current) drug therapy: Secondary | ICD-10-CM | POA: Insufficient documentation

## 2016-03-19 DIAGNOSIS — R531 Weakness: Secondary | ICD-10-CM | POA: Diagnosis present

## 2016-03-19 DIAGNOSIS — Z7984 Long term (current) use of oral hypoglycemic drugs: Secondary | ICD-10-CM | POA: Diagnosis not present

## 2016-03-19 DIAGNOSIS — R600 Localized edema: Secondary | ICD-10-CM | POA: Insufficient documentation

## 2016-03-19 DIAGNOSIS — E119 Type 2 diabetes mellitus without complications: Secondary | ICD-10-CM | POA: Diagnosis not present

## 2016-03-19 LAB — CBC
HEMATOCRIT: 39.5 % — AB (ref 40.0–52.0)
Hemoglobin: 13.3 g/dL (ref 13.0–18.0)
MCH: 32.1 pg (ref 26.0–34.0)
MCHC: 33.6 g/dL (ref 32.0–36.0)
MCV: 95.7 fL (ref 80.0–100.0)
PLATELETS: 139 10*3/uL — AB (ref 150–440)
RBC: 4.13 MIL/uL — ABNORMAL LOW (ref 4.40–5.90)
RDW: 14 % (ref 11.5–14.5)
WBC: 5.8 10*3/uL (ref 3.8–10.6)

## 2016-03-19 LAB — URINALYSIS COMPLETE WITH MICROSCOPIC (ARMC ONLY)
Bilirubin Urine: NEGATIVE
GLUCOSE, UA: NEGATIVE mg/dL
Hgb urine dipstick: NEGATIVE
NITRITE: NEGATIVE
PROTEIN: 30 mg/dL — AB
SPECIFIC GRAVITY, URINE: 1.031 — AB (ref 1.005–1.030)
pH: 6 (ref 5.0–8.0)

## 2016-03-19 LAB — BASIC METABOLIC PANEL
Anion gap: 8 (ref 5–15)
BUN: 9 mg/dL (ref 6–20)
CALCIUM: 8.9 mg/dL (ref 8.9–10.3)
CO2: 26 mmol/L (ref 22–32)
CREATININE: 0.58 mg/dL — AB (ref 0.61–1.24)
Chloride: 102 mmol/L (ref 101–111)
GFR calc Af Amer: >60 mL/min (ref >60)
GLUCOSE: 96 mg/dL (ref 65–99)
POTASSIUM: 4 mmol/L (ref 3.5–5.1)
SODIUM: 136 mmol/L (ref 135–145)

## 2016-03-19 LAB — AMMONIA: AMMONIA: 19 umol/L (ref 9–35)

## 2016-03-19 LAB — VALPROIC ACID LEVEL: Valproic Acid Lvl: 110 ug/mL — ABNORMAL HIGH (ref 50.0–100.0)

## 2016-03-19 NOTE — ED Notes (Signed)
MD Paduchowski and social work at bedside

## 2016-03-19 NOTE — ED Notes (Signed)
Patient transported to CT 

## 2016-03-19 NOTE — ED Notes (Signed)
Patient returned from ultrasound. Social work at bedside

## 2016-03-19 NOTE — ED Provider Notes (Signed)
-----------------------------------------   5:37 PM on 03/19/2016 -----------------------------------------  Patient's medical workup has resulted largely within normal limits. Patient's CT scan of the head is negative. The social worker has been working with the family and the group facility. As the patient does not meet admission criteria at this time we will discharge back to the group home, the family is agreeable to this plan. They will work with the patient's PCP Dr. Dareen PianoAnderson to arrange for further resources if needed. The social workers also provided the family with resources as well.   Minna AntisKevin Ranika Mcniel, MD 03/19/16 [phone]

## 2016-03-19 NOTE — ED Triage Notes (Signed)
Brought in b/c he is not walking  Feels like he is getting weaker

## 2016-03-19 NOTE — ED Provider Notes (Signed)
[facility] Emergency Department Provider Note    First MD Initiated Contact with Patient 03/19/16 1343     (approximate)  I have reviewed the triage vital signs and the nursing notes.   HISTORY  Chief Complaint Extremity Weakness and Weakness    HPI Daniel Mitchell is a 50 y.o. male  presenting with a chief complaint of weakness and not ambulating. Patient was a group home and has been refusing physical therapy. Family also is worried about worsening lower extremity swelling. No recent fevers. Patient also has a history of recurrent UTIs. They bring patient to the ER due to concern for difficulty walking as the group home but does not have high enough level care reportedly. Currently requesting placement at [facility] as a see the patient was doing better there.   Past Medical History:  Diagnosis Date  . Ataxia    Demyelinating motor neuropathy  . Chronic UTI (urinary tract infection)   . Depression    History of depression  . Developmental disability   . Diabetes mellitus type 2, controlled (HCC)   . Hypertension   . 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 Seasonal allergies   . Seizure disorder (HCC)   . Urethral foreign body 08/05/14   Dr. Lonna Cobb  . Urogenital disorder    Multiple surgeries; H/O Undescended testicle; nocturnal enuresis   Family History  Problem Relation Age of Onset  . Stroke Father    Past Surgical History:  Procedure Laterality Date  . CYSTOSCOPY  04/09/14   Status post cystoscopy and removal of calculus  . CYSTOSCOPY  09/03/14   Status post cystoscopy with removal of calculus  . INGUINAL HERNIA REPAIR     Patient Active Problem List   Diagnosis Date Noted  . History of external ear infection 02/09/2016  . 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 Authorizing Provider  baclofen (LIORESAL) 10 MG tablet Take 10 mg by mouth 3 (three) times daily.     Historical Provider, MD  chlorhexidine (PERIDEX) 0.12 % solution Use as directed 15 mLs in the mouth or throat 2 (two) times daily. Reported on 11/09/2015    Historical Provider, MD  divalproex (DEPAKOTE) 500 MG DR tablet Take 1,000 mg by mouth 2 (two) times daily.    Historical Provider, MD  docusate sodium (COLACE) 100 MG capsule Take 100 mg by mouth 2 (two) times daily as needed.     Historical Provider, MD  escitalopram (LEXAPRO) 20 MG tablet Take 20 mg by mouth 2 (two) times daily.    Historical Provider, MD  ferrous sulfate 325 (65 FE) MG tablet Take 325 mg by mouth daily.    Historical Provider, MD  fexofenadine (ALLEGRA) 180 MG tablet Take 180 mg by mouth daily.    Historical Provider, MD  metFORMIN (GLUCOPHAGE) 500 MG tablet Take 500 mg by mouth 2 (two) times daily with a meal.    Historical Provider, MD  nystatin (MYCOSTATIN) powder Apply topically 2 (two) times daily.    Historical Provider, MD  Olopatadine HCl (PATANASE) 0.6 % SOLN Place into the nose. Nasal spray: Used 2 sprays in each nostril twice daily as needed for nasal congestion.    Historical Provider, MD  paliperidone (INVEGA) 3 MG 24 hr tablet Take 3 mg by mouth every morning.    Historical Provider, MD  pravastatin (PRAVACHOL) 20 MG tablet Take 20 mg by mouth daily.  Historical Provider, MD  propranolol (INDERAL) 60 MG tablet Take 60 mg by mouth daily.    Historical Provider, MD  risperiDONE (RISPERDAL M-TABS) 1 MG disintegrating tablet Take 2 mg by mouth. Dissolve 1 tablet under tongue as needed for agitation. May repeat in 20 minutes.    Historical Provider, MD    Allergies Topamax [topiramate] and Zithromax [azithromycin]    Social History Social History  Substance Use Topics  . Smoking status: Never Smoker  . Smokeless tobacco: Never Used  . Alcohol use No    Review of Systems Patient denies headaches, rhinorrhea, blurry vision, numbness, shortness of breath, chest pain, edema, cough, abdominal pain,  nausea, vomiting, diarrhea, dysuria, fevers, rashes or hallucinations unless otherwise stated above in HPI. ____________________________________________   PHYSICAL EXAM:  VITAL SIGNS: Vitals:   03/19/16 1223  BP: 115/77  Pulse: 60  Resp: 18  Temp: 98.8 F (37.1 C)   Constitutional: Alert a in no acute distress. Eyes: Conjunctivae are normal. PERRL. EOMI. Head: Atraumatic. Nose: No congestion/rhinnorhea. Mouth/Throat: Mucous membranes are moist.  Oropharynx non-erythematous. Neck: No stridor. Painless ROM. No cervical spine tenderness to palpation Hematological/Lymphatic/Immunilogical: No cervical lymphadenopathy. Cardiovascular: Normal rate, regular rhythm. Grossly normal heart sounds.  Good peripheral circulation. Respiratory: Normal respiratory effort.  No retractions. Lungs CTAB. Gastrointestinal: Soft and nontender. No distention. No abdominal bruits. No CVA tenderness. Musculoskeletal: No lower extremity tenderness,  1+ bilateral edema.  No joint effusions. Neurologic:   No gross focal neurologic deficits are appreciated. MAE spontaneously, no facial droop Skin:  Skin is warm, dry and intact. No rash noted. Psychiatric: Mood and affect are normal. Speech and behavior are normal.  ____________________________________________   LABS (all labs ordered are listed, but only abnormal results are displayed)  Results for orders placed or performed during the hospital encounter of 03/19/16 (from the past 24 hour(s))  Basic metabolic panel     Status: Abnormal   Collection Time: 03/19/16 12:31 PM  Result Value Ref Range   Sodium 136 135 - 145 mmol/L   Potassium 4.0 3.5 - 5.1 mmol/L   Chloride 102 101 - 111 mmol/L   CO2 26 22 - 32 mmol/L   Glucose, Bld 96 65 - 99 mg/dL   BUN 9 6 - 20 mg/dL   Creatinine, Ser 1.610.58 (L) 0.61 - 1.24 mg/dL   Calcium 8.9 8.9 - 09.610.3 mg/dL   GFR calc non Af Amer >60 >60 mL/min   GFR calc Af Amer >60 >60 mL/min   Anion gap 8 5 - 15  CBC     Status:  Abnormal   Collection Time: 03/19/16 12:31 PM  Result Value Ref Range   WBC 5.8 3.8 - 10.6 K/uL   RBC 4.13 (L) 4.40 - 5.90 MIL/uL   Hemoglobin 13.3 13.0 - 18.0 g/dL   HCT 04.539.5 (L) 40.940.0 - 81.152.0 %   MCV 95.7 80.0 - 100.0 fL   MCH 32.1 26.0 - 34.0 pg   MCHC 33.6 32.0 - 36.0 g/dL   RDW 91.414.0 78.211.5 - 95.614.5 %   Platelets 139 (L) 150 - 440 K/uL  Urinalysis complete, with microscopic     Status: Abnormal   Collection Time: 03/19/16 12:31 PM  Result Value Ref Range   Color, Urine AMBER (A) YELLOW   APPearance HAZY (A) CLEAR   Glucose, UA NEGATIVE NEGATIVE mg/dL   Bilirubin Urine NEGATIVE NEGATIVE   Ketones, ur 1+ (A) NEGATIVE mg/dL   Specific Gravity, Urine 1.031 (H) 1.005 - 1.030   Hgb urine  dipstick NEGATIVE NEGATIVE   pH 6.0 5.0 - 8.0   Protein, ur 30 (A) NEGATIVE mg/dL   Nitrite NEGATIVE NEGATIVE   Leukocytes, UA TRACE (A) NEGATIVE   RBC / HPF 0-5 0 - 5 RBC/hpf   WBC, UA 0-5 0 - 5 WBC/hpf   Bacteria, UA RARE (A) NONE SEEN   Squamous Epithelial / LPF 0-5 (A) NONE SEEN   Mucous PRESENT    Ca Oxalate Crys, UA PRESENT   Valproic acid level     Status: Abnormal   Collection Time: 03/19/16 12:31 PM  Result Value Ref Range   Valproic Acid Lvl 110 (H) 50.0 - 100.0 ug/mL   ____________________________________________  EKG My review and personal interpretation at Time: 12:59   Indication: weakness  Rate: 100  Rhythm: sinus Axis: normal Other: non specific st changes, no acute ischemia ____________________________________________  RADIOLOGY  I personally reviewed all radiographic images ordered to evaluate for the above acute complaints and reviewed radiology reports and findings.  These findings were personally discussed with the patient.  Please see medical record for radiology report.  ____________________________________________   PROCEDURES  Procedure(s) performed: none Procedures    Critical Care performed: no ____________________________________________   INITIAL  IMPRESSION / ASSESSMENT AND PLAN / ED COURSE  Pertinent labs & imaging results that were available during my care of the patient were reviewed by me and considered in my medical decision making (see chart for details).  DDX: electrolyte abn, chf, dvt, deconditioning,   Daniel Mitchell is a 50 y.o. who presents to the ED with complaint of generalized weakness and deconditioning. Blood work otherwise reassuring. No evidence of UTI. No evidence of congestive heart failure. No lateralizing neuro deficits. Based on history and refusal to work with physical therapy do feel that deconditioning is most likely source of patient's weakness. Ultrasound ordered due to concern for DVT given immobility and lower extremity swelling. Social work is currently speaking with patient regarding placement. Patient clinically stable for discharge back to group home. Patient will be signed out to Dr. Lenard LancePaduchowski pending SW and Duplex.  Clinical Course      ____________________________________________   FINAL CLINICAL IMPRESSION(S) / ED DIAGNOSES  Final diagnoses:  Weakness  Lower extremity edema      NEW MEDICATIONS STARTED DURING THIS VISIT:  New Prescriptions   No medications on file     Note:  This document was prepared using Dragon voice recognition software and may include unintentional dictation errors.    Willy EddyPatrick Rayya Yagi, MD 03/19/16 [phone]

## 2016-03-19 NOTE — ED Notes (Signed)
Reviewed d/c instructions, follow-up care with patient's family and caregiver's. Family/caregiver's verbalized understanding

## 2016-03-19 NOTE — ED Notes (Signed)
RN to room to meet patient - patient at ultrasound.

## 2016-03-19 NOTE — Progress Notes (Signed)
LCSW met with patient and his 2 [facility] Providers and 2 family members with his Mother and sister. It became apparent that the family had some concerns that patient requires higher level of care and  that the patient has been steadily declining for last few years neurologically and physically.Family reports he was lethargic more over the last few days and needs 2 people if not 3 to assist with his care. They reported they are not happy with his current providers [facility]) and LCSW as per suggestion provided them with [id number]  Facility list and Proofreader and home health support services ( Home attendants) Family wants patient to go to [facility] and LCSW explained at length what is required for Long term approval in a SNF  [facility] staff were encouraged to contact their director of program to see if they can increase their level of care.  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   LCSW spoke with patient and encouraged him to do his workout to get strong, he agreed with everything I stated but he at least participated and smiled when I paid attention to him ( family explained he says yes to everything he doesn't understand)LCSW agreed but expressed need for patient to be active in his own care plan.  LCSW consulted with EDP and explained the family will go to patients  family care physician to provide a referral to a long term care facility and that they will advocate for increased care with [facility] staff and director and will call [facility].  Jubal Rademaker LCSW

## 2016-03-19 NOTE — Discharge Instructions (Addendum)
Mr. Daniel Mitchell has been seen in the emergency department today for weakness. The patient's medical workup as shown normal results. We believe the weakness to be a result of deconditioning. We recommend increasing physical therapy and encouraging participation from the patient. Patient also has lower extremity edema. We recommend the patient restart compression stockings to be worn during the day. He is return to emergency department for any personally concerning symptoms.

## 2016-03-19 NOTE — ED Notes (Signed)
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 Pts family reports patient has been steadily declining neurologically over the last several years.  Group home was able to ambulate patient as 1-person assist on Friday. Representative reports patient was more lethargic than normal, and required 2-person assist for ambulation today.

## 2016-05-02 ENCOUNTER — Other Ambulatory Visit: Payer: Self-pay | Admitting: Neurology

## 2016-05-02 DIAGNOSIS — R262 Difficulty in walking, not elsewhere classified: Secondary | ICD-10-CM

## 2016-05-10 ENCOUNTER — Ambulatory Visit: Payer: Medicare Other

## 2016-06-03 ENCOUNTER — Ambulatory Visit
Admission: RE | Admit: 2016-06-03 | Discharge: 2016-06-03 | Disposition: A | Discharge status: Home / Self Care | Payer: Medicare Other | Source: Ambulatory Visit | Attending: Neurology | Admitting: Neurology

## 2016-06-03 DIAGNOSIS — R262 Difficulty in walking, not elsewhere classified: Secondary | ICD-10-CM | POA: Diagnosis not present

## 2016-06-03 DIAGNOSIS — G319 Degenerative disease of nervous system, unspecified: Secondary | ICD-10-CM | POA: Diagnosis not present

## 2016-06-03 MED ORDER — GADOBENATE DIMEGLUMINE 529 MG/ML IV SOLN
15.0000 mL | Freq: Once | INTRAVENOUS | Status: AC | PRN
  Administered 2016-06-03: 15 mL via INTRAVENOUS

## 2016-08-12 IMAGING — CT CT HEAD WITHOUT CONTRAST
2 series · 14 of 30 positions shown, 16 images · non-contrast
Comparison: 08/21/2012

CLINICAL DATA: Fall from bed.  Weakness.

EXAM:
CT HEAD WITHOUT CONTRAST
TECHNIQUE: Contiguous axial images were obtained from the base of the skull
through the vertex without intravenous contrast.

[Series 2: soft tissue · axial · 0.43mm/px · z∈[-109,-9]mm · 6 of 29 slices shown, 8 images]
[im 5/29  brain]
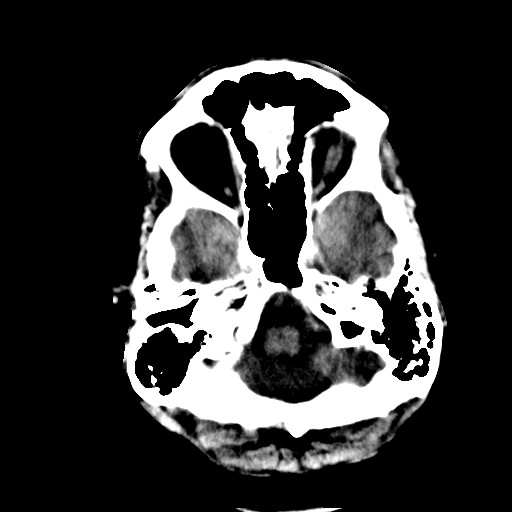
[im 5/29  bone]
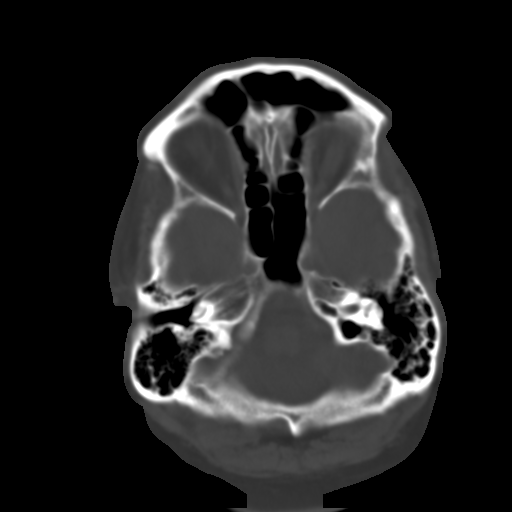
[im 9/29  brain]
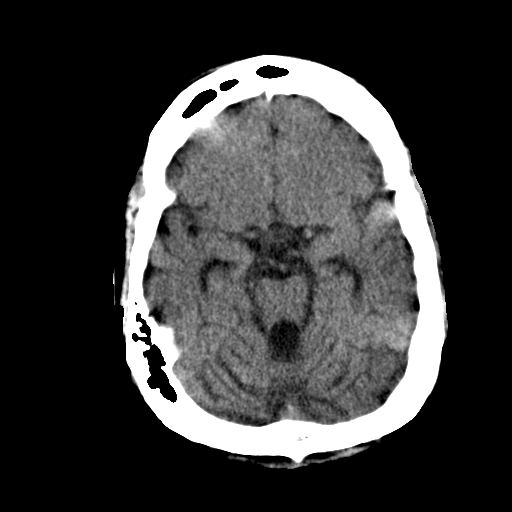
[im 13/29  brain]
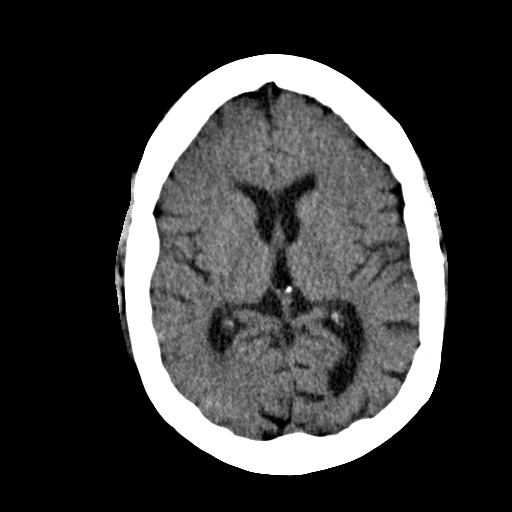
[im 17/29  brain]
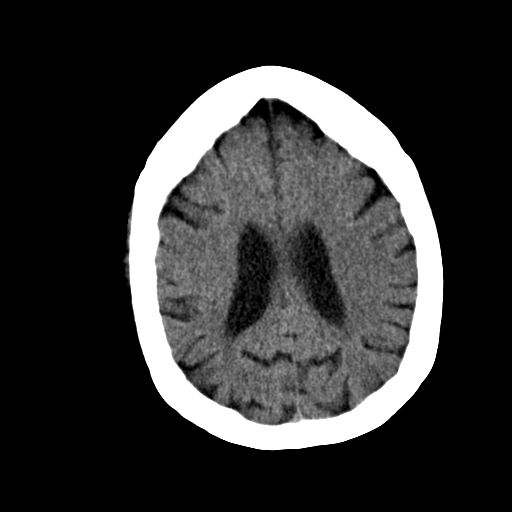
[im 21/29  brain]
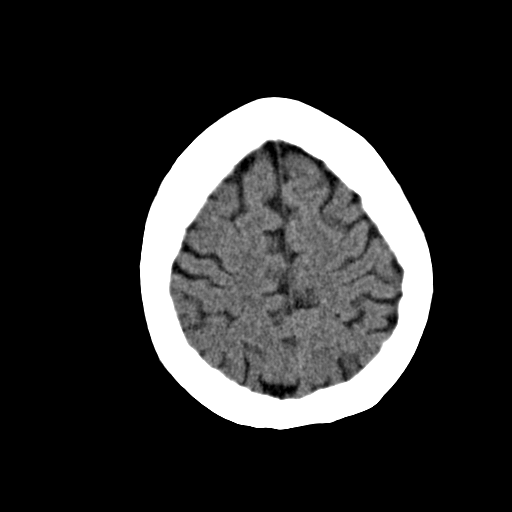
[im 21/29  bone]
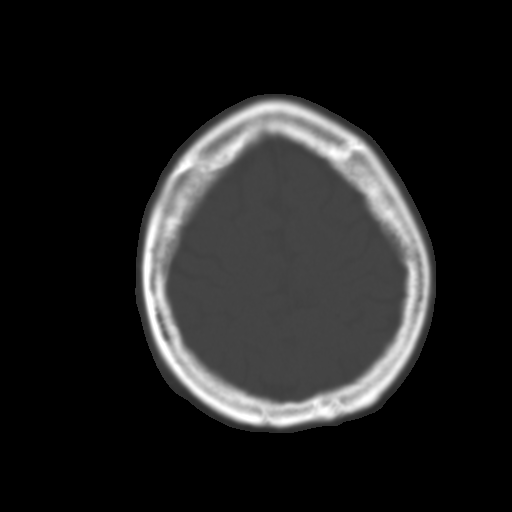
[im 25/29  brain]
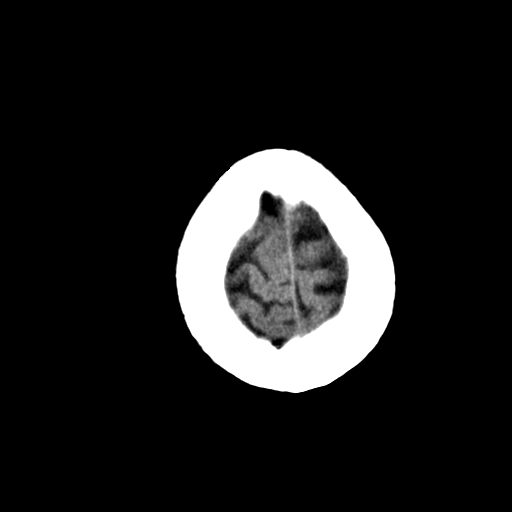

[Series 3: bone · axial · 0.43mm/px · z∈[-125,+7]mm · 8 of 82 slices shown]
[im 8/82  bone]
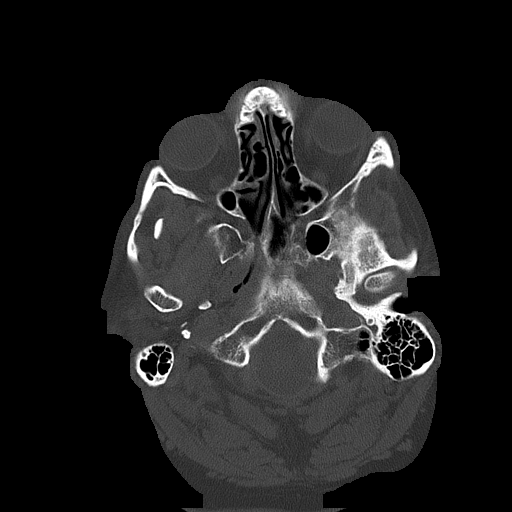
[im 16/82  bone]
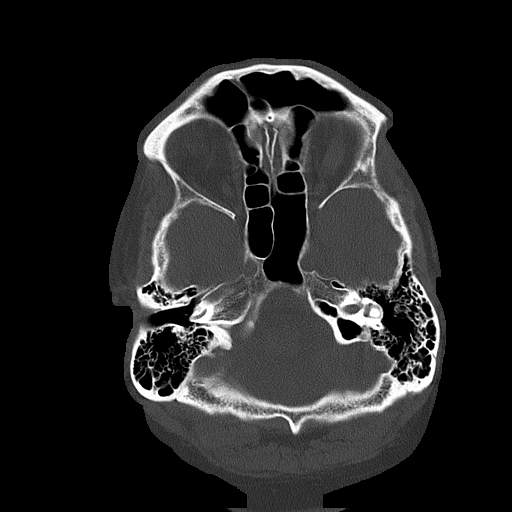
[im 28/82  bone]
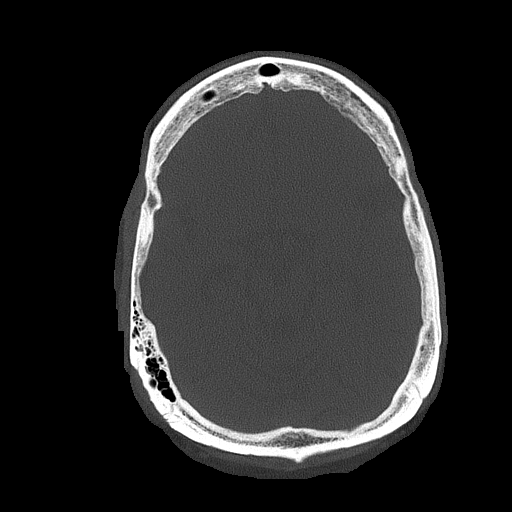
[im 35/82  bone]
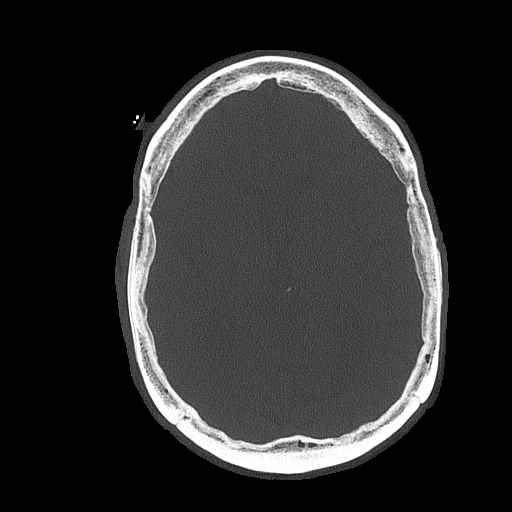
[im 47/82  bone]
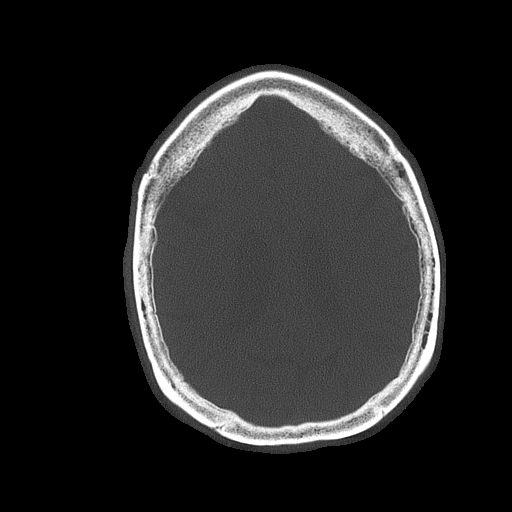
[im 55/82  bone]
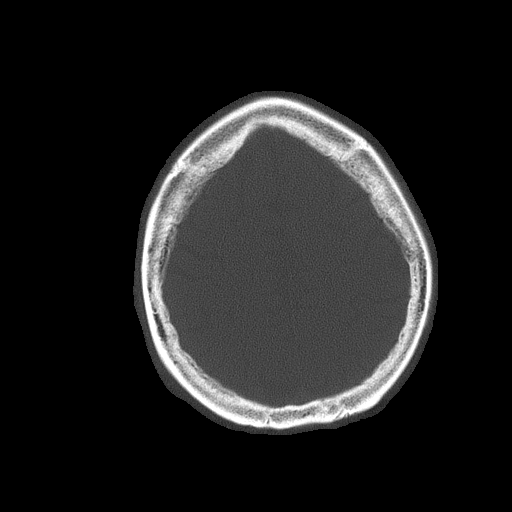
[im 66/82  bone]
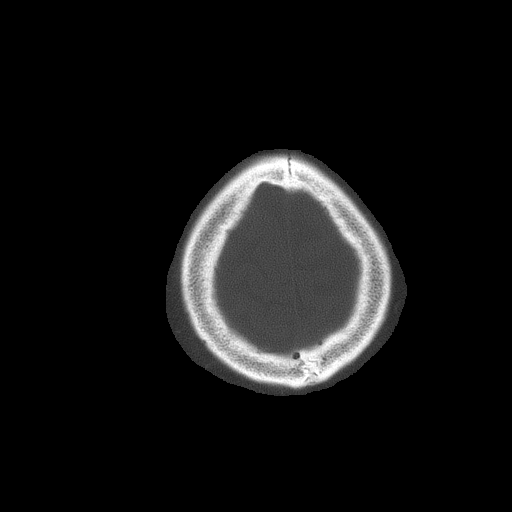
[im 74/82  bone]
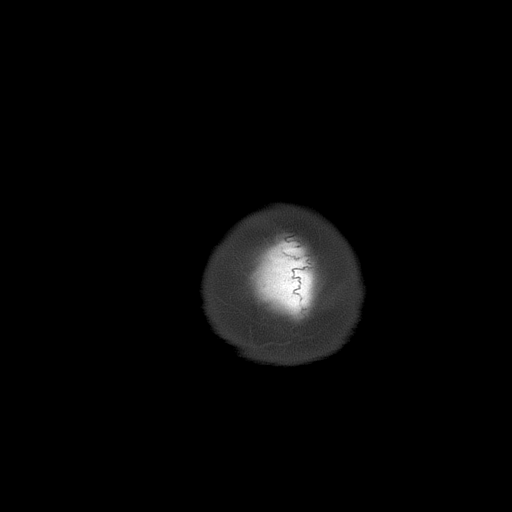

[14 of 30 positions shown; findings below may reference images not displayed]

FINDINGS: Prominent cerebellar and moderate cerebral atrophy for age.

Otherwise, the brainstem, cerebellum, cerebral peduncles, thalamus,
basal ganglia, basilar cisterns, and ventricular system appear
within normal limits. No intracranial hemorrhage, mass lesion, or
acute CVA.
IMPRESSION: 1. No acute intracranial findings.
2. Prominent cerebellar atrophy and moderate cerebral atrophy.

## 2016-08-12 IMAGING — US ABDOMEN ULTRASOUND
1 series · 13 of 25 positions shown · non-contrast
Comparison: Abdominal ultrasound 09/08/2008. CT abdomen and pelvis
08/29/2008.

CLINICAL DATA: Elevated liver function tests. Surgical history
includes cholecystectomy.

EXAM:
ULTRASOUND ABDOMEN COMPLETE

[Series 1: abdomen ultrasound · 0.25mm/px · 13 of 74 slices shown]
[im 1/74]
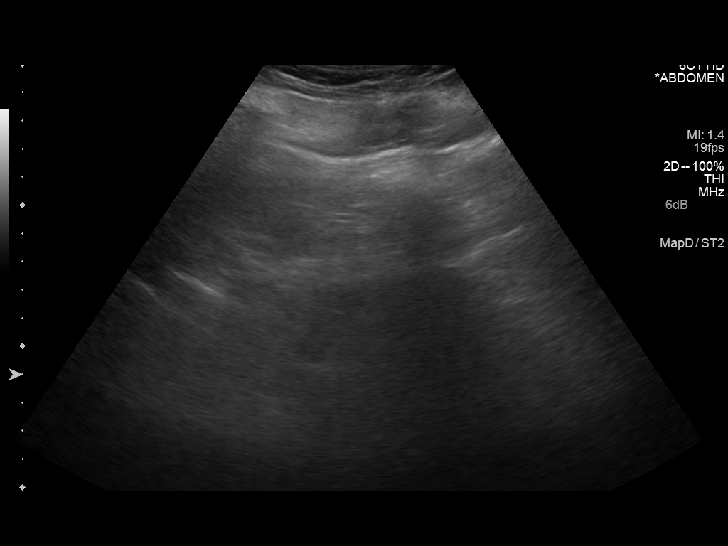
[im 7/74]
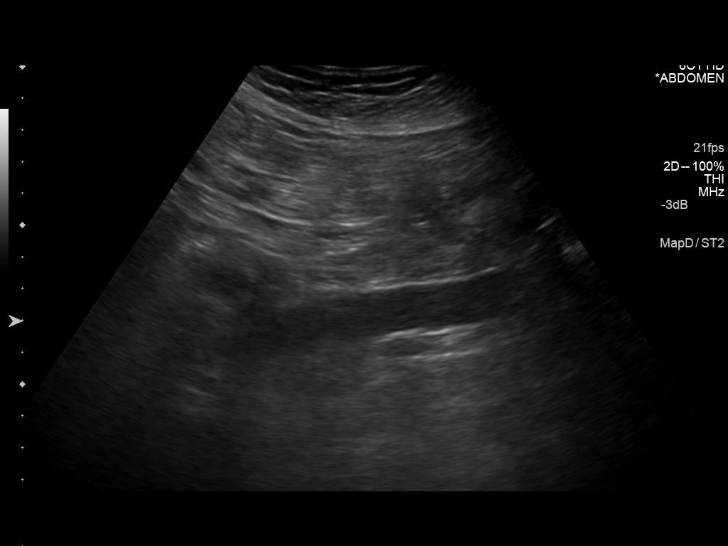
[im 13/74]
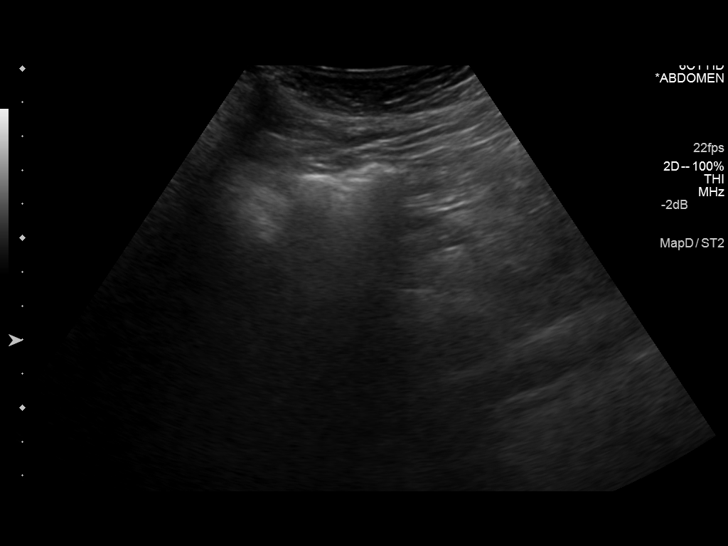
[im 19/74]
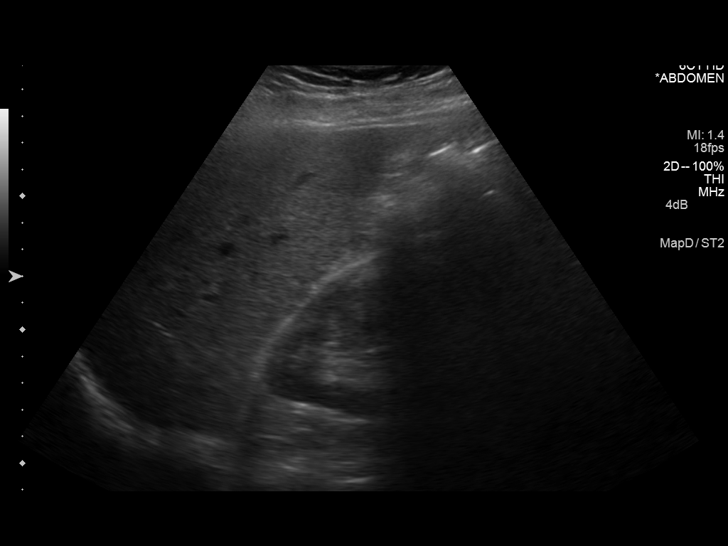
[im 25/74]
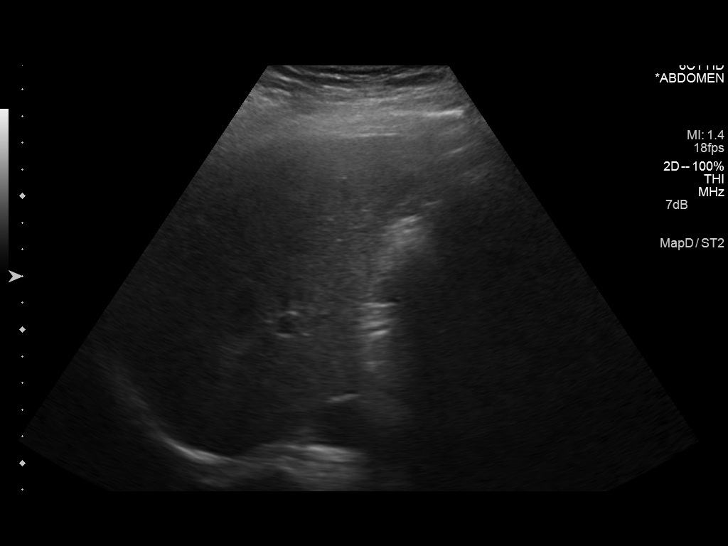
[im 31/74]
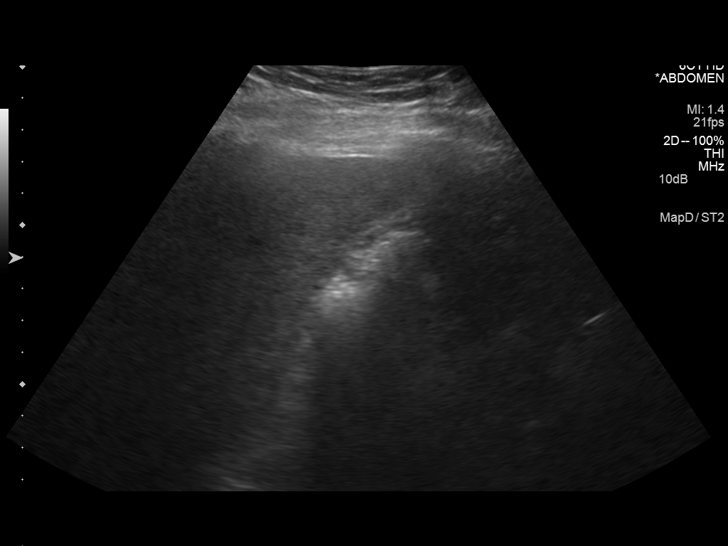
[im 37/74]
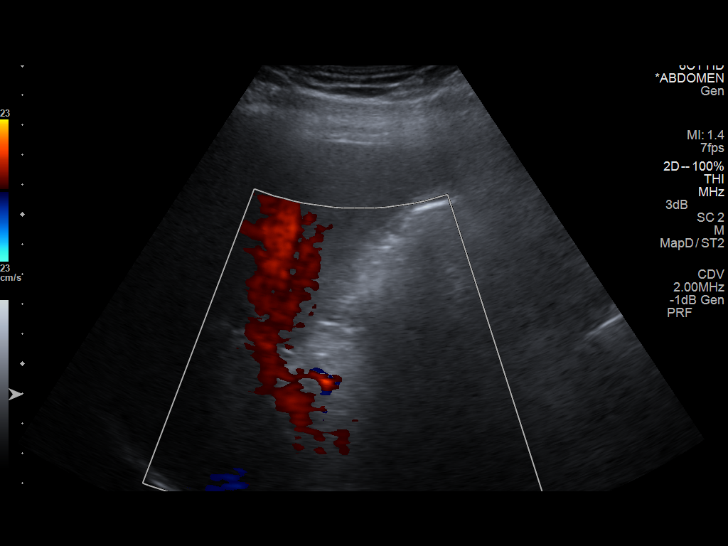
[im 43/74]
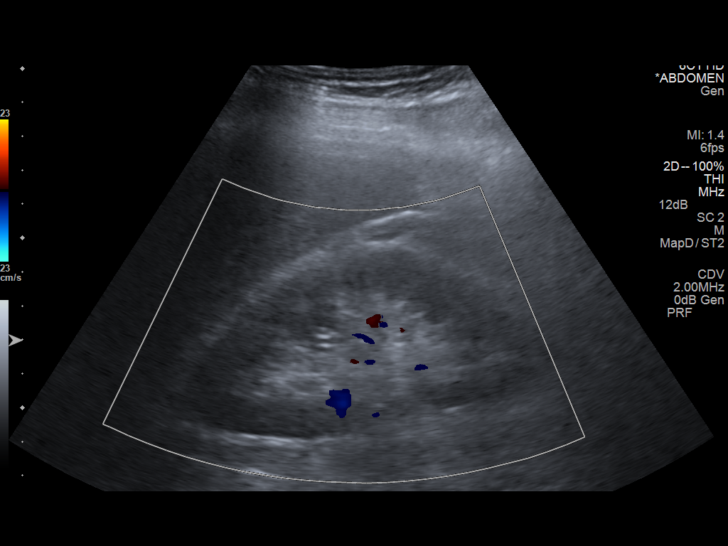
[im 49/74]
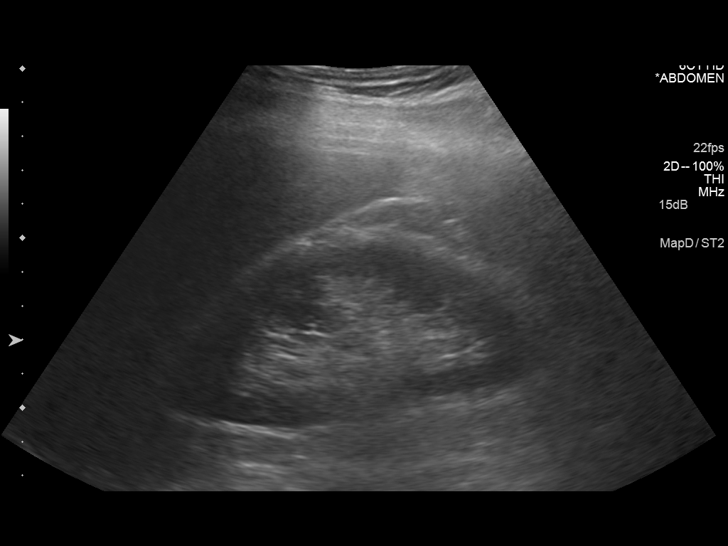
[im 55/74]
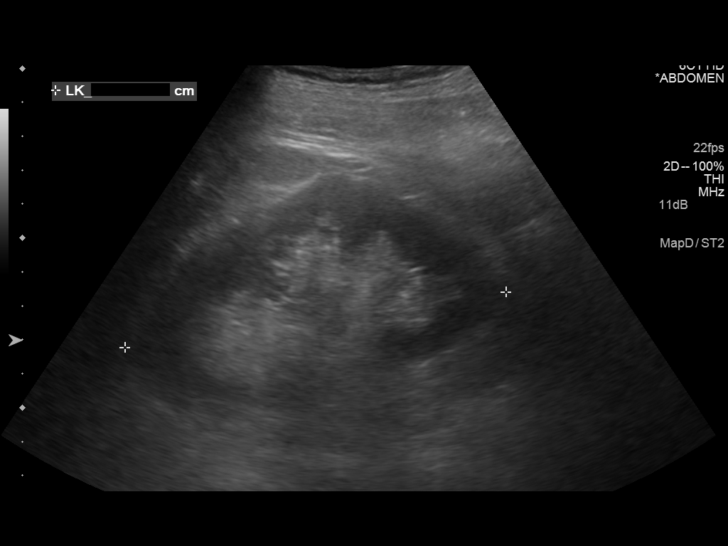
[im 61/74]
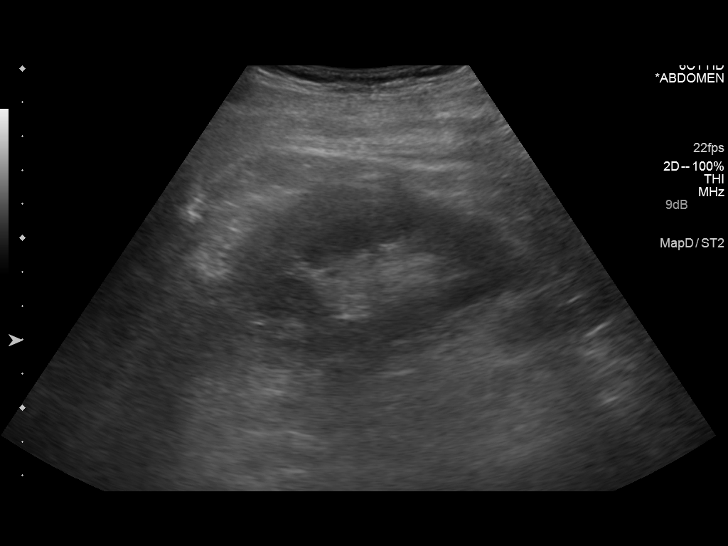
[im 67/74]
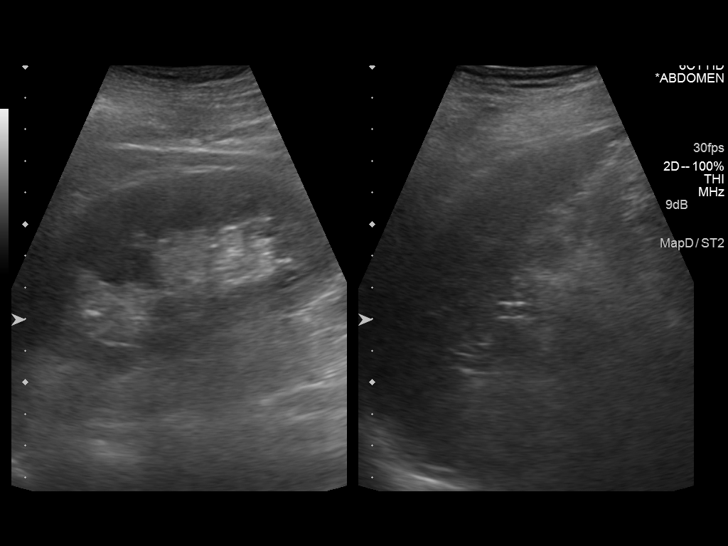
[im 74/74]
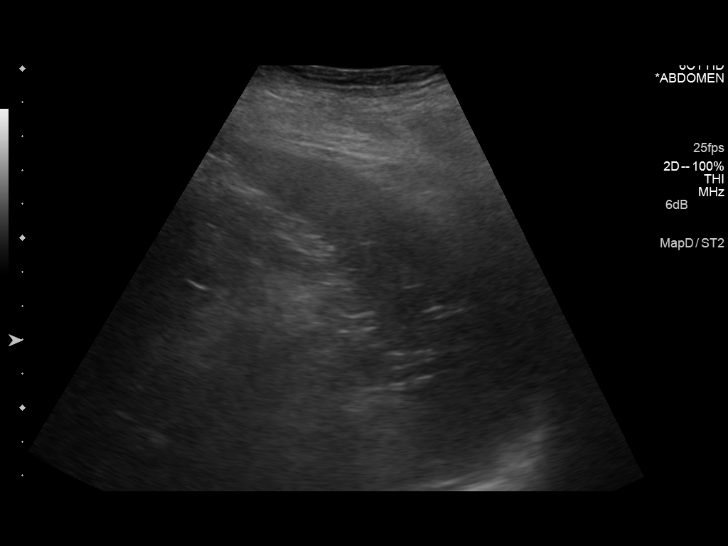

[13 of 25 positions shown; findings below may reference images not displayed]

FINDINGS: Gallbladder:

Surgically absent.

Common bile duct:

Diameter: Approximately 5 mm

Liver:

Normal size and echotexture without focal parenchymal abnormality.
Patent portal vein with hepatopetal flow.

IVC:

Patent.

Pancreas:

Not well visualized due to midline bowel gas.

Spleen:

Normal size and echotexture without focal parenchymal abnormality.

Right Kidney:

Length: Approximately 11.2 cm. No hydronephrosis. Well-preserved
cortex. No shadowing calculi. Normal parenchymal echotexture. No
focal parenchymal abnormality.

Left Kidney:

Length: Approximately 11.4 cm. No hydronephrosis. Well-preserved
cortex. No shadowing calculi. Normal parenchymal echotexture. No
focal parenchymal abnormality.

Abdominal aorta:

Normal in caliber throughout its visualized course in the abdomen
without evidence of significant atherosclerosis. Maximum diameter
2.0 cm.

Other findings:

None.
IMPRESSION: Normal examination post cholecystectomy with a caveat that the
pancreas was obscured by midline bowel gas was therefore not
evaluated.

## 2016-08-14 IMAGING — CT CT HEAD WITHOUT CONTRAST
2 series · 14 of 30 positions shown, 16 images · non-contrast
Comparison: CT scan of January 11, 2014.

CLINICAL DATA: Headache.

EXAM:
CT HEAD WITHOUT CONTRAST
TECHNIQUE: Contiguous axial images were obtained from the base of the skull
through the vertex without intravenous contrast.

[Series 2: head wo · axial · 0.40mm/px · z∈[-66,+34]mm · 6 of 30 slices shown, 8 images]
[im 5/30  brain]
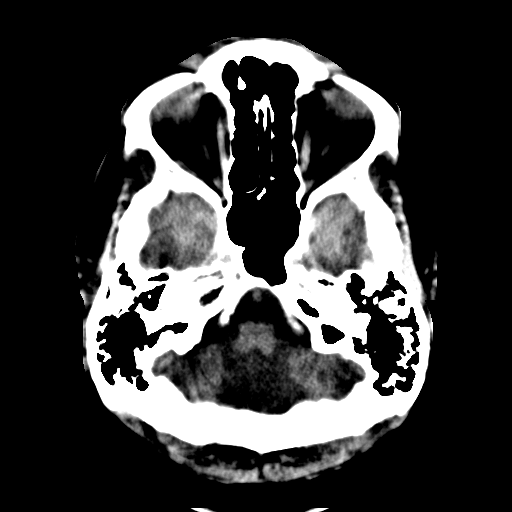
[im 5/30  bone]
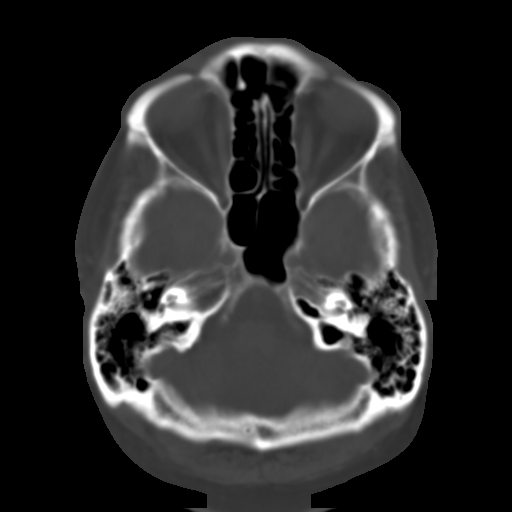
[im 9/30  brain]
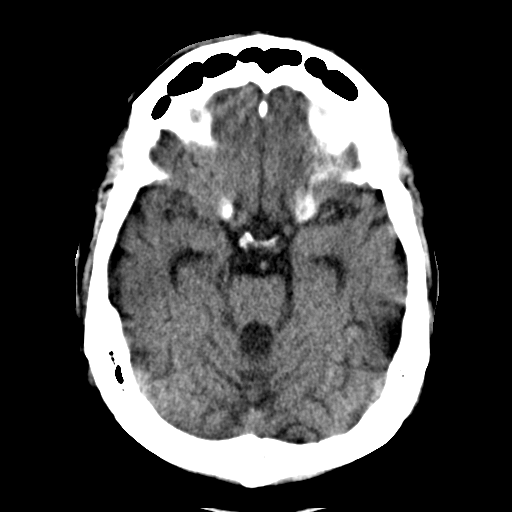
[im 13/30  brain]
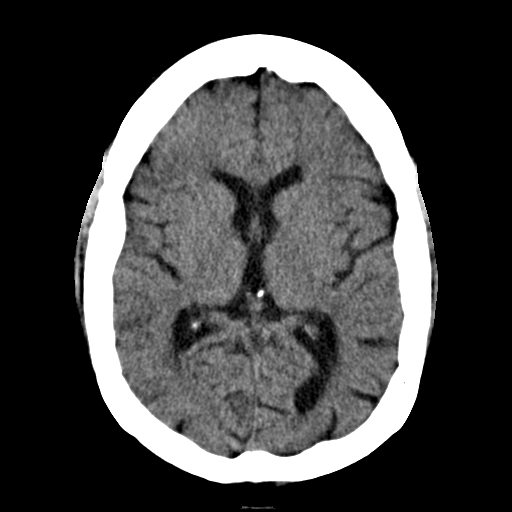
[im 17/30  brain]
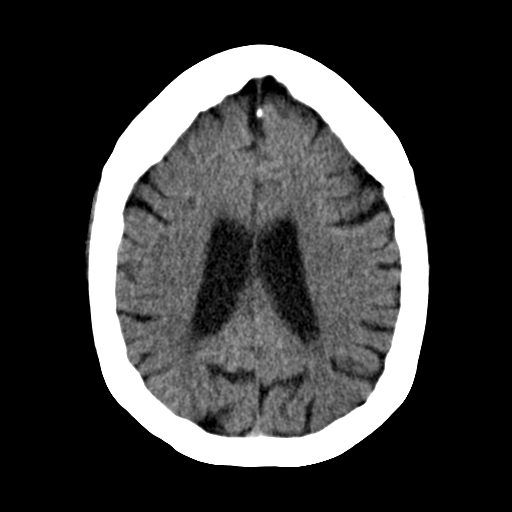
[im 21/30  brain]
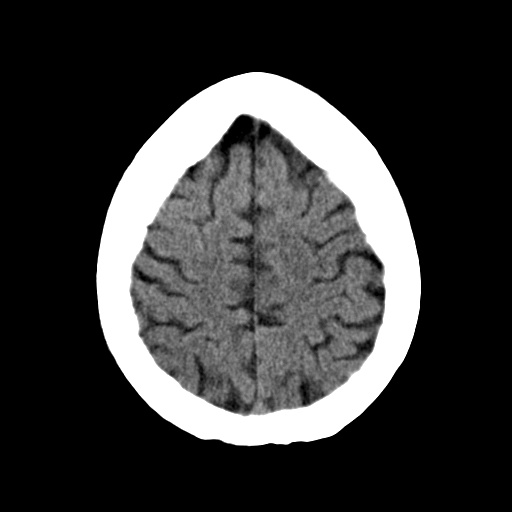
[im 21/30  bone]
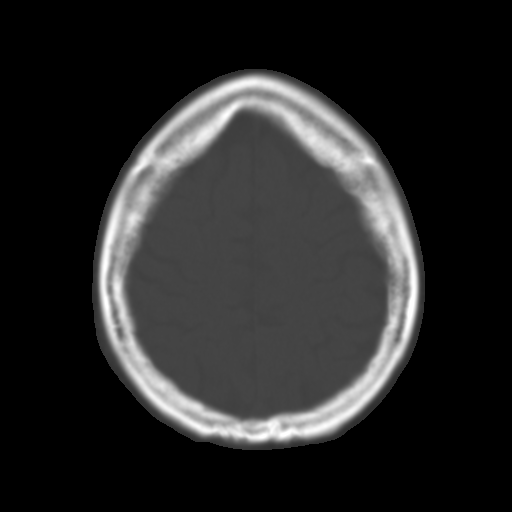
[im 25/30  brain]
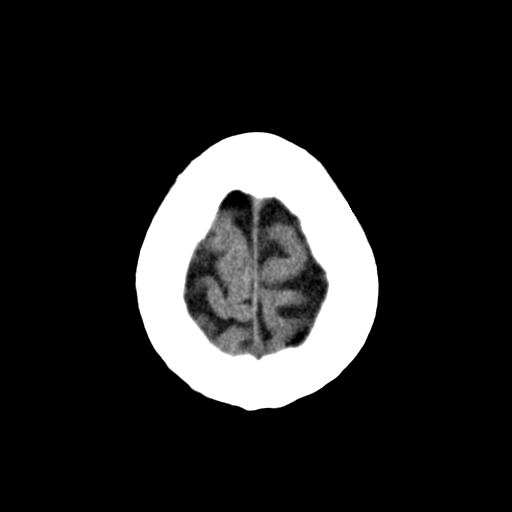

[Series 3: head bone · axial · 0.40mm/px · z∈[-83,+51]mm · 8 of 83 slices shown]
[im 8/83  bone]
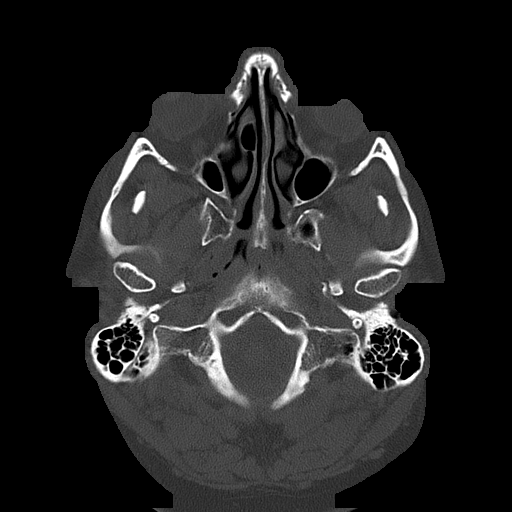
[im 16/83  bone]
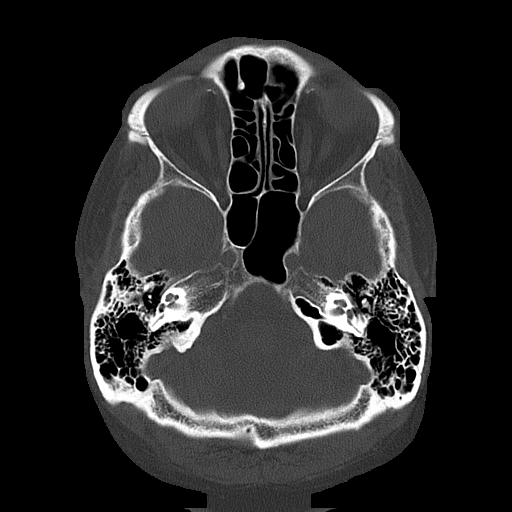
[im 28/83  bone]
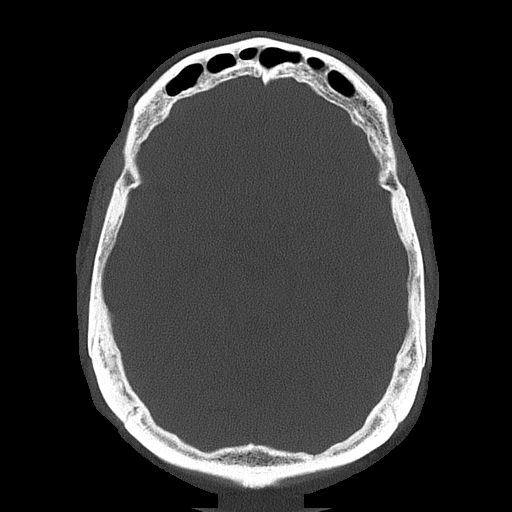
[im 36/83  bone]
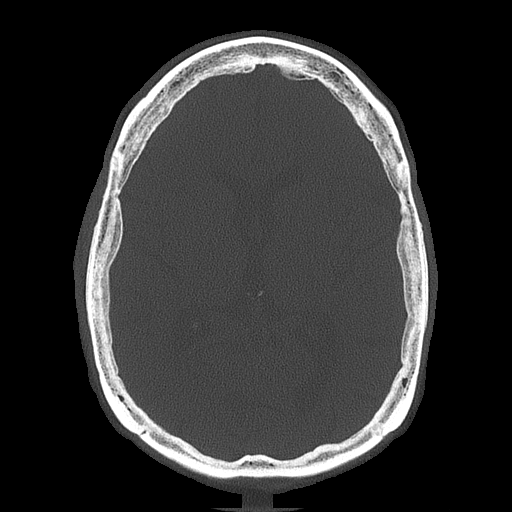
[im 47/83  bone]
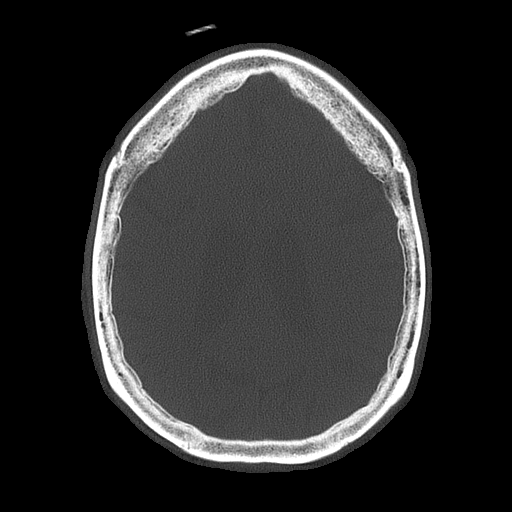
[im 55/83  bone]
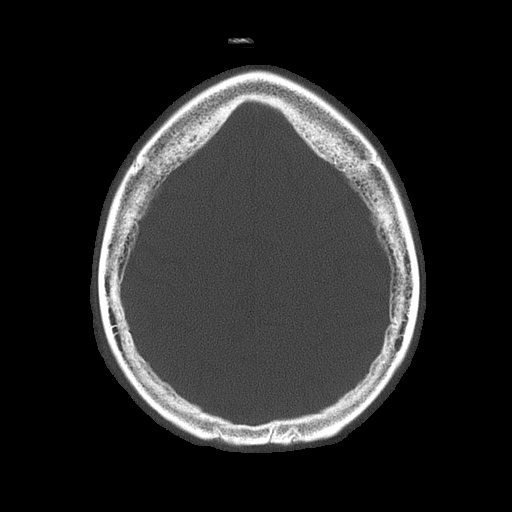
[im 67/83  bone]
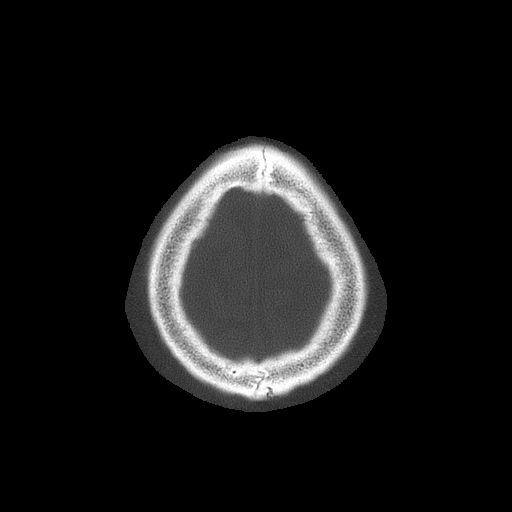
[im 75/83  bone]
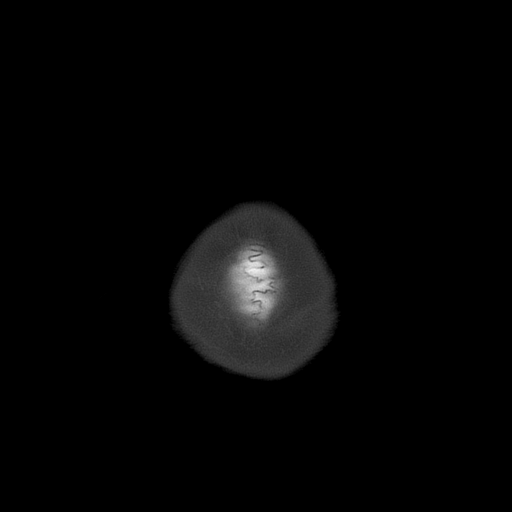

[14 of 30 positions shown; findings below may reference images not displayed]

FINDINGS: Bony calvarium appears intact. Moderate diffuse cerebral and
cerebellar atrophy is noted. No mass effect or midline shift is
noted. Ventricular size is within normal limits. There is no
evidence of mass lesion, hemorrhage or acute infarction.
IMPRESSION: Moderate diffuse cerebral and cerebellar atrophy. No acute
intracranial abnormality seen.

## 2016-08-29 ENCOUNTER — Ambulatory Visit (HOSPITAL_COMMUNITY): Payer: Medicaid - Dental | Admitting: Dentistry

## 2016-08-29 ENCOUNTER — Encounter (HOSPITAL_COMMUNITY): Payer: Self-pay | Admitting: Dentistry

## 2016-08-29 VITALS — BP 107/61 | HR 76 | Temp 98.3°F

## 2016-08-29 DIAGNOSIS — K08109 Complete loss of teeth, unspecified cause, unspecified class: Secondary | ICD-10-CM

## 2016-08-29 DIAGNOSIS — K0530 Chronic periodontitis, unspecified: Secondary | ICD-10-CM | POA: Diagnosis not present

## 2016-08-29 DIAGNOSIS — Z463 Encounter for fitting and adjustment of dental prosthetic device: Secondary | ICD-10-CM

## 2016-08-29 DIAGNOSIS — K036 Deposits [accretions] on teeth: Secondary | ICD-10-CM

## 2016-08-29 DIAGNOSIS — F89 Unspecified disorder of psychological development: Secondary | ICD-10-CM

## 2016-08-29 DIAGNOSIS — Z972 Presence of dental prosthetic device (complete) (partial): Secondary | ICD-10-CM

## 2016-08-29 DIAGNOSIS — M264 Malocclusion, unspecified: Secondary | ICD-10-CM

## 2016-08-29 DIAGNOSIS — K0820 Unspecified atrophy of edentulous alveolar ridge: Secondary | ICD-10-CM

## 2016-08-29 DIAGNOSIS — K08409 Partial loss of teeth, unspecified cause, unspecified class: Secondary | ICD-10-CM

## 2016-08-29 NOTE — Progress Notes (Signed)
08/29/2016  Patient:            Daniel Mitchell Date of Birth:  1966/01/29 MRN:                [id number]  BP 107/61 (BP Location: Left Arm)   Pulse 76   Temp 98.3 F (36.8 C) (Oral)   Daniel Mitchell is a 51 year old male that presents for periodic oral exam, dental cleaning, and adjustment of upper complete and lower partial denture as indicated. Patient, mother, and caregivers deny any acute medical or dental problems. Patient recently had an annual checkup in February 2018. Patient also recently evaluated by urology and neurology.  Premedication: None required  Patient Active Problem List   Diagnosis Date Noted  . History of external ear infection 02/09/2016  . 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 Ataxia    Demyelinating motor neuropathy  . Chronic UTI (urinary tract infection)   . Depression    History of depression  . Developmental disability   . Diabetes mellitus type 2, controlled (HCC)   . Hypertension   . 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 Seasonal allergies   . Seizure disorder (HCC)   . Urethral foreign body 08/05/14   Dr. Lonna Cobb  . Urogenital disorder    Multiple surgeries; H/O Undescended testicle; nocturnal enuresis   Past Surgical History:  Procedure Laterality Date  . CYSTOSCOPY  04/09/14   Status post cystoscopy and removal of calculus  . CYSTOSCOPY  09/03/14   Status post cystoscopy with removal of calculus  . INGUINAL HERNIA REPAIR      ALLERGIES/ADVERSE DRUG REACTIONS: Allergies  Allergen Reactions  . Topamax [Topiramate]   . Zithromax [Azithromycin]     MEDICATIONS: Current Outpatient Prescriptions  Medication Sig Dispense Refill  . acetaminophen (TYLENOL) 500 MG tablet Take 500 mg by mouth every 4 (four) hours as needed.    Marland Kitchen azelastine (ASTELIN) 0.1 % nasal spray Place 2 sprays into both nostrils 2 (two) times daily. Use in each nostril as directed    . baclofen (LIORESAL) 10  MG tablet Take 10 mg by mouth 3 (three) times daily.    . diclofenac sodium (VOLTAREN) 1 % GEL Apply 2 g topically 4 (four) times daily.    . divalproex (DEPAKOTE) 500 MG DR tablet Take 1,000 mg by mouth 2 (two) times daily.    Marland Kitchen docusate sodium (COLACE) 100 MG capsule Take 100 mg by mouth 2 (two) times daily as needed.     Marland Kitchen escitalopram (LEXAPRO) 20 MG tablet Take 20 mg by mouth 2 (two) times daily.    . ferrous sulfate 325 (65 FE) MG tablet Take 325 mg by mouth daily.    Marland Kitchen ibuprofen (ADVIL,MOTRIN) 800 MG tablet Take 800 mg by mouth every 8 (eight) hours as needed.    . loratadine (CLARITIN) 10 MG tablet Take 10 mg by mouth daily.    . metFORMIN (GLUCOPHAGE) 500 MG tablet Take 500 mg by mouth 2 (two) times daily with a meal.    . Multiple Vitamin (MULTIVITAMIN) tablet Take 1 tablet by mouth daily.    Marland Kitchen nystatin (MYCOSTATIN) powder Apply topically 2 (two) times daily.    . Olopatadine HCl (PATANASE) 0.6 % SOLN Place into the nose. Nasal spray: Used 2 sprays in each nostril twice daily as needed for nasal congestion.    . paliperidone (INVEGA) 3 MG 24 hr tablet Take 3 mg by mouth  every morning.    . pravastatin (PRAVACHOL) 20 MG tablet Take 20 mg by mouth daily.    . risperiDONE (RISPERDAL M-TABS) 1 MG disintegrating tablet Take 2 mg by mouth. Dissolve 1 tablet under tongue as needed for agitation. May repeat in 20 minutes.     No current facility-administered medications for this visit.      C/C: Patient presents with his mother for periodic oral examination, dental cleaning, and evaluation of upper complete and lower acrylic partial denture.  HPI:  Patient was last seen for exam and cleaning and adjustment of an upper complete and lower partial denture on February 09, 2016. Patient is not complaining of any dental pain by report of mother. Medical changes as above.  DENTAL EXAM: General: Patient is a developmentally disabled male in no acute distress. The patient was transferred to the  dental chair from a wheelchair with aid of caregiver.  Extraoral Exam: There is no lymphadenopathy noted. No tenderness to palpation. There are no TMJ symptoms. Intraoral  Exam: Patient has excessive saliva. There is minimal denture adhesive that is adherent to the maxillary tissues. There is no evidence of any palatal irritation or soft tissue lesion. 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 There is gingival recession associated with lower right and left molars. Tooth #19 now has Class I tooth mobility.   Dentition: Patient with tooth numbers 18, 19, and 31 remaining. All other teeth are missing. Periodontal: Patient has chronic periodontitis with very minimal plaque accumulations, generalized gingival recession, and Class I mobility of tooth #'s 19. No sensitivity reported today.  Oral hygiene is Very Good.  Adult prophylaxis provided with a sonic scaler and selective hand curettes. Teeth were polished. Fluoride varnish placed. Caries/Restorations: No caries noted. Endodontic: No acute pulpitis symptoms.  C&B: Patient has Full Gold crown the tooth number 19.  Prosthodontic: Patient has an upper complete denture and a lower Flexite mandibular partial denture.  DENTURES ARE CLEANED ACCEPTABLY. Patient ideally could benefit from oral maxillofacial prosthodontic evaluation for possible implant therapy and fabrication of an implant retained lower partial denture or complete denture if remaining teeth are removed. Mother refuses this option and subsequent referral at this time. Occlusion: The occlusion of the dentures is less than ideal but appears to be acceptable at this time. Bilateral mandibular posterior crossbite is noted as before.  Assessments: 1. Chronic periodontitis 2. Accretions 3. Generalized gingival recession 4. Tooth mobility #19 5. Multiple missing teeth 6. Significant Atrophy of edentulous alveolar ridges 7. Less than ideal upper complete and lower Flexite  partial denture.  Procedures: 1. Periodic oral exam 2. Adult prophylaxis with sonic scaler and selective hand curettes. [place].  3. Application of fluoride varnish to remaining teeth   Plan:  1. I discussed the risks, benefits, and complications of various treatment options to the patient's mother in relationship to the patient's medical and dental conditions.  We proceeded with exam, adult prophylaxis, and fluoride varnish application-today. 2. Patient and caregivers are to continue to brush teeth at least twice daily with fluoride toothpaste.  3. Return to clinic for exam and cleaning in 6 months as scheduled. 03/13/2017 at 10:30 am. 4. Caregiver or Mother to call if problems arise before the next scheduled appointment.   Charlynne Panderonald F. Kulinski, DDS

## 2016-08-29 NOTE — Patient Instructions (Signed)
Plan:  1. I discussed the risks, benefits, and complications of various treatment options to the patient's mother in relationship to the patient's medical and dental conditions.  We proceeded with exam, adult prophylaxis, and fluoride varnish application-today. 2. Patient and caregivers are to continue to brush teeth at least twice daily with fluoride toothpaste.  3. Return to clinic for exam and cleaning in 6 months as scheduled.  4. Caregiver or Mother to call if problems arise before the next scheduled appointment.   Charlynne Panderonald F. Sharlisa Hollifield, DDS

## 2017-03-13 ENCOUNTER — Ambulatory Visit (HOSPITAL_COMMUNITY): Payer: Self-pay | Admitting: Dentistry

## 2017-03-27 ENCOUNTER — Encounter (INDEPENDENT_AMBULATORY_CARE_PROVIDER_SITE_OTHER): Payer: Self-pay

## 2017-03-27 ENCOUNTER — Ambulatory Visit (HOSPITAL_COMMUNITY): Payer: Medicaid - Dental | Admitting: Dentistry

## 2017-03-27 VITALS — BP 115/67 | HR 94 | Temp 98.8°F

## 2017-03-27 DIAGNOSIS — Z972 Presence of dental prosthetic device (complete) (partial): Secondary | ICD-10-CM

## 2017-03-27 DIAGNOSIS — K06010 Localized gingival recession, unspecified: Secondary | ICD-10-CM

## 2017-03-27 DIAGNOSIS — K0820 Unspecified atrophy of edentulous alveolar ridge: Secondary | ICD-10-CM

## 2017-03-27 DIAGNOSIS — K08409 Partial loss of teeth, unspecified cause, unspecified class: Secondary | ICD-10-CM

## 2017-03-27 DIAGNOSIS — K0530 Chronic periodontitis, unspecified: Secondary | ICD-10-CM | POA: Diagnosis not present

## 2017-03-27 DIAGNOSIS — K036 Deposits [accretions] on teeth: Secondary | ICD-10-CM

## 2017-03-27 DIAGNOSIS — K08109 Complete loss of teeth, unspecified cause, unspecified class: Secondary | ICD-10-CM

## 2017-03-27 DIAGNOSIS — M264 Malocclusion, unspecified: Secondary | ICD-10-CM

## 2017-03-27 DIAGNOSIS — F89 Unspecified disorder of psychological development: Secondary | ICD-10-CM

## 2017-03-27 NOTE — Patient Instructions (Signed)
Plan:  1. Patient and caregivers are to continue to brush teeth at least twice daily with fluoride toothpaste.  2. Return to clinic for exam and cleaning in 6 months as scheduled.   October 09, 2017 at 10:45 am 3. Caregiver or Mother to call if problems arise before the next scheduled appointment.   Charlynne Panderonald F. Makai Dumond, DDS

## 2017-03-27 NOTE — Progress Notes (Signed)
03/27/2017  Patient:            Daniel Mitchell Date of Birth:  12/10/1965 MRN:                [id number]  BP 115/67 (BP Location: Left Arm)   Pulse 94   Temp 98.8 F (37.1 C) (Oral)   Daniel Mitchell is a [age]year old male that presents for periodic oral exam, dental cleaning, and adjustment of upper complete and lower partial denture as indicated. Patient, mother, and caregivers deny any acute medical or dental problems. Patient recently had an annual checkup in February 2018. Patient also recently evaluated by urology and neurology. The patient with possible urological surgical procedure in 2019.  Premedication: None required  Patient Active Problem List   Diagnosis Date Noted  . History of external ear infection 02/09/2016  . 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 Ataxia    Demyelinating motor neuropathy  . Chronic UTI (urinary tract infection)   . Depression    History of depression  . Developmental disability   . Diabetes mellitus type 2, controlled (HCC)   . Hypertension   . 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 Seasonal allergies   . Seizure disorder (HCC)   . Urethral foreign body 08/05/14   Dr. Lonna CobbStoioff  . Urogenital disorder    Multiple surgeries; H/O Undescended testicle; nocturnal enuresis   Past Surgical History:  Procedure Laterality Date  . CYSTOSCOPY  04/09/14   Status post cystoscopy and removal of calculus  . CYSTOSCOPY  09/03/14   Status post cystoscopy with removal of calculus  . INGUINAL HERNIA REPAIR      ALLERGIES/ADVERSE DRUG REACTIONS: Allergies  Allergen Reactions  . Topamax [Topiramate]   . Zithromax [Azithromycin]     MEDICATIONS: Current Outpatient Medications  Medication Sig Dispense Refill  . acetaminophen (TYLENOL) 500 MG tablet Take 500 mg by mouth every 4 (four) hours as needed.    Marland Kitchen. azelastine (ASTELIN) 0.1 % nasal spray Place 2 sprays into both nostrils 2 (two) times  daily. Use in each nostril as directed    . baclofen (LIORESAL) 10 MG tablet Take 10 mg by mouth 3 (three) times daily.    . diclofenac sodium (VOLTAREN) 1 % GEL Apply 2 g topically 4 (four) times daily.    . divalproex (DEPAKOTE) 500 MG DR tablet Take 1,000 mg by mouth 2 (two) times daily.    Marland Kitchen. docusate sodium (COLACE) 100 MG capsule Take 100 mg by mouth 2 (two) times daily as needed.     Marland Kitchen. escitalopram (LEXAPRO) 20 MG tablet Take 20 mg by mouth 2 (two) times daily.    . ferrous sulfate 325 (65 FE) MG tablet Take 325 mg by mouth daily.    Marland Kitchen. ibuprofen (ADVIL,MOTRIN) 800 MG tablet Take 800 mg by mouth every 8 (eight) hours as needed.    . loratadine (CLARITIN) 10 MG tablet Take 10 mg by mouth daily.    . metFORMIN (GLUCOPHAGE) 500 MG tablet Take 500 mg by mouth 2 (two) times daily with a meal.    . Multiple Vitamin (MULTIVITAMIN) tablet Take 1 tablet by mouth daily.    Marland Kitchen. nystatin (MYCOSTATIN) powder Apply topically 2 (two) times daily.    . Olopatadine HCl (PATANASE) 0.6 % SOLN Place into the nose. Nasal spray: Used 2 sprays in each nostril twice daily as needed for nasal congestion.    . paliperidone (INVEGA) 3 MG  24 hr tablet Take 3 mg by mouth every morning.    . pravastatin (PRAVACHOL) 20 MG tablet Take 20 mg by mouth daily.    . risperiDONE (RISPERDAL M-TABS) 1 MG disintegrating tablet Take 2 mg by mouth. Dissolve 1 tablet under tongue as needed for agitation. May repeat in 20 minutes.     No current facility-administered medications for this visit.      C/C: Patient presents with his mother for periodic oral examination, dental cleaning, and evaluation of upper complete and lower acrylic partial denture.  HPI:  Patient was last seen for exam and cleaning and adjustment of an upper complete and lower partial denture 08/29/2016. Patient is not complaining of any dental pain by report of mother. Medical changes as above.  DENTAL EXAM: General: Patient is a developmentally disabled male  in no acute distress. The patient was transferred to the dental chair from a wheelchair with aid of caregiver.  Extraoral Exam: There is no lymphadenopathy noted. No tenderness to palpation. There are no TMJ symptoms. Intraoral  Exam: Patient has excessive saliva. There is minimal denture adhesive that is adherent to the maxillary tissues. There is no evidence of any palatal irritation or soft tissue lesion. 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 There is gingival recession associated with lower right and left molars. Tooth #19 now has Class I tooth mobility.   Dentition: Patient with tooth numbers 18, 19, and 31 remaining. All other teeth are missing. Periodontal: Patient has chronic periodontitis with very minimal plaque accumulations, generalized gingival recession, and Class I mobility of tooth #'s 19. No sensitivity reported today.  Oral hygiene is Very Good.  Adult prophylaxis provided with a sonic scaler and selective hand curettes. Teeth were polished. Fluoride varnish placed. Caries/Restorations: No caries noted. Endodontic: No acute pulpitis symptoms.  C&B: Patient has Full Gold crown the tooth number 19.  Prosthodontic: Patient has an upper complete denture and a lower Flexite mandibular partial denture.  DENTURES ARE CLEANED ACCEPTABLY. Patient ideally could benefit from oral maxillofacial prosthodontic evaluation for possible implant therapy and fabrication of an implant retained lower partial denture or complete denture if remaining teeth are removed. Mother does not wish to proceed with referral at this time. Occlusion: The occlusion of the dentures is less than ideal but appears to be acceptable at this time. Bilateral mandibular posterior crossbite is noted as before.  Assessments: 1. Chronic periodontitis 2. Accretions 3. Generalized gingival recession 4. Tooth mobility #19 5. Multiple missing teeth 6. Significant Atrophy of edentulous alveolar ridges 7.  Less than ideal upper complete and lower Flexite partial denture.  Procedures: 1. Periodic oral exam 2. Adult prophylaxis with sonic scaler and selective hand curettes. [place].  3. Application of fluoride varnish to remaining teeth   Plan:  1. Patient and caregivers are to continue to brush teeth at least twice daily with fluoride toothpaste.  2. Return to clinic for exam and cleaning in 6 months as scheduled.  3. Caregiver or Mother to call if problems arise before the next scheduled appointment.   Charlynne Panderonald F. [place], DDS

## 2017-10-09 ENCOUNTER — Encounter (HOSPITAL_COMMUNITY): Payer: Self-pay | Admitting: Dentistry

## 2017-10-17 ENCOUNTER — Encounter (HOSPITAL_COMMUNITY): Payer: Self-pay | Admitting: Dentistry

## 2017-10-17 ENCOUNTER — Ambulatory Visit (HOSPITAL_COMMUNITY): Payer: Medicaid - Dental | Admitting: Dentistry

## 2017-10-17 VITALS — BP 110/74 | HR 70 | Temp 98.6°F

## 2017-10-17 DIAGNOSIS — M264 Malocclusion, unspecified: Secondary | ICD-10-CM

## 2017-10-17 DIAGNOSIS — K036 Deposits [accretions] on teeth: Secondary | ICD-10-CM

## 2017-10-17 DIAGNOSIS — K08409 Partial loss of teeth, unspecified cause, unspecified class: Secondary | ICD-10-CM | POA: Diagnosis not present

## 2017-10-17 DIAGNOSIS — Z98811 Dental restoration status: Secondary | ICD-10-CM

## 2017-10-17 DIAGNOSIS — K0530 Chronic periodontitis, unspecified: Secondary | ICD-10-CM | POA: Diagnosis not present

## 2017-10-17 DIAGNOSIS — F79 Unspecified intellectual disabilities: Secondary | ICD-10-CM | POA: Diagnosis not present

## 2017-10-17 DIAGNOSIS — Z972 Presence of dental prosthetic device (complete) (partial): Secondary | ICD-10-CM

## 2017-10-17 DIAGNOSIS — F89 Unspecified disorder of psychological development: Secondary | ICD-10-CM

## 2017-10-17 DIAGNOSIS — K0820 Unspecified atrophy of edentulous alveolar ridge: Secondary | ICD-10-CM

## 2017-10-17 DIAGNOSIS — K08109 Complete loss of teeth, unspecified cause, unspecified class: Secondary | ICD-10-CM

## 2017-10-17 NOTE — Progress Notes (Signed)
10/17/2017  Patient:            Daniel Mitchell Date of Birth:  10-09-1965 MRN:                [id number]  BP 110/74 (BP Location: Right Arm)   Pulse 70   Temp 98.6 F (37 C)   Margarita "Daniel Mitchell" Muchow is a 52year old male that presents for periodic oral exam, dental cleaning, and adjustment of upper complete and lower partial denture as indicated. Patient, mother, and caregivers deny any acute medical or dental problems. Patient recently had an urethroplasty with Dr. Guy Sandifer in January 2019.  No other acute problems by patient or mother's report.  Premedication: None required  Patient Active Problem List   Diagnosis Date Noted  . History of external ear infection 02/09/2016  . 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 Ataxia    Demyelinating motor neuropathy  . Chronic UTI (urinary tract infection)   . Depression    History of depression  . Developmental disability   . Diabetes mellitus type 2, controlled (HCC)   . Hypertension   . 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 Seasonal allergies   . Seizure disorder (HCC)   . Urethral foreign body 08/05/14   Dr. Lonna Cobb  . Urogenital disorder    Multiple surgeries; H/O Undescended testicle; nocturnal enuresis   Past Surgical History:  Procedure Laterality Date  . CYSTOSCOPY  04/09/14   Status post cystoscopy and removal of calculus  . CYSTOSCOPY  09/03/14   Status post cystoscopy with removal of calculus  . INGUINAL HERNIA REPAIR      ALLERGIES/ADVERSE DRUG REACTIONS: Allergies  Allergen Reactions  . Topamax [Topiramate]   . Zithromax [Azithromycin]     MEDICATIONS: Current Outpatient Medications  Medication Sig Dispense Refill  . acetaminophen (TYLENOL) 500 MG tablet Take 500 mg by mouth every 4 (four) hours as needed.    Daniel Mitchell azelastine (ASTELIN) 0.1 % nasal spray Place 2 sprays into both nostrils 2 (two) times daily. Use in each nostril as directed    . baclofen (LIORESAL)  10 MG tablet Take 10 mg by mouth 3 (three) times daily.    . diclofenac sodium (VOLTAREN) 1 % GEL Apply 2 g topically 4 (four) times daily.    . divalproex (DEPAKOTE) 500 MG DR tablet Take 1,000 mg by mouth 2 (two) times daily.    Daniel Mitchell docusate sodium (COLACE) 100 MG capsule Take 100 mg by mouth 2 (two) times daily as needed.     Daniel Mitchell escitalopram (LEXAPRO) 20 MG tablet Take 20 mg by mouth 2 (two) times daily.    . ferrous sulfate 325 (65 FE) MG tablet Take 325 mg by mouth daily.    Daniel Mitchell ibuprofen (ADVIL,MOTRIN) 800 MG tablet Take 800 mg by mouth every 8 (eight) hours as needed.    . loratadine (CLARITIN) 10 MG tablet Take 10 mg by mouth daily.    . metFORMIN (GLUCOPHAGE) 500 MG tablet Take 500 mg by mouth 2 (two) times daily with a meal.    . Multiple Vitamin (MULTIVITAMIN) tablet Take 1 tablet by mouth daily.    Daniel Mitchell nystatin (MYCOSTATIN) powder Apply topically 2 (two) times daily.    . Olopatadine HCl (PATANASE) 0.6 % SOLN Place into the nose. Nasal spray: Used 2 sprays in each nostril twice daily as needed for nasal congestion.    . paliperidone (INVEGA) 3 MG 24 hr tablet Take 3 mg  by mouth every morning.    . pravastatin (PRAVACHOL) 20 MG tablet Take 20 mg by mouth daily.    . risperiDONE (RISPERDAL M-TABS) 1 MG disintegrating tablet Take 2 mg by mouth. Dissolve 1 tablet under tongue as needed for agitation. May repeat in 20 minutes.     No current facility-administered medications for this visit.      C/C: Patient presents with his mother for periodic oral examination, dental cleaning, and evaluation of upper complete and lower acrylic partial denture.  HPI:  Patient was last seen for exam and cleaning and adjustment of an upper complete and lower partial denture in December of 2018. Patient is not complaining of any dental pain by report of mother. Medical changes as above.  DENTAL EXAM: General: Patient is a developmentally disabled male in no acute distress. The patient was transferred to the  dental chair from a wheelchair with aid of caregiver.  Extraoral Exam: There is no lymphadenopathy noted. No tenderness to palpation. There are no TMJ symptoms. Intraoral  Exam: Patient has excessive saliva. There is minimal denture adhesive that is adherent to the maxillary tissues. There is no evidence of any palatal irritation or soft tissue lesion. 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 There is gingival recession associated with lower right and left molars. Tooth #19 now has Class I tooth mobility.   Dentition: Patient with tooth numbers 18, 19, and 31 remaining. All other teeth are missing. Periodontal: Patient has chronic periodontitis with very minimal plaque accumulations, generalized gingival recession, and Class I mobility of tooth #'s 19. No sensitivity reported today.  Oral hygiene is Very Good.  Adult prophylaxis provided with an ultrasonic scaler and selective hand curettes. Teeth were polished. Fluoride varnish placed. Caries/Restorations: No caries noted. Endodontic: No acute pulpitis symptoms.  C&B: Patient has Full Gold crown the tooth number 19.  Prosthodontic: Patient has an upper complete denture and a lower Flexite mandibular partial denture.  DENTURES ARE CLEANED ACCEPTABLY. Patient ideally could benefit from oral maxillofacial prosthodontic evaluation for possible implant therapy and fabrication of an implant retained lower partial denture or complete denture if remaining teeth are removed. Mother does not wish to proceed with referral at this time.  Patient is using Fixodent Ultrahold denture adhesive to assist in retention of the upper complete denture. Occlusion: The occlusion of the dentures is less than ideal but appears to be acceptable at this time. Bilateral mandibular posterior crossbite is noted as before.  Assessments: 1. Chronic periodontitis 2. Accretions 3. Generalized gingival recession 4. Tooth mobility #19 5. Multiple missing teeth 6.  Significant Atrophy of edentulous alveolar ridges 7. Less than ideal upper complete and lower Flexite partial denture.  Procedures: 1. Periodic oral exam 2. Adult prophylaxis with ultrasonic scaler and selective hand curettes. [place].  3. Application of fluoride varnish to remaining teeth   Plan:  1. Patient and caregivers are to continue to brush teeth at least twice daily with fluoride toothpaste.  2. Patient was given a prescription for the use of Fixodent ultrahold denture adhesive for use with the upper denture. 3. Return to clinic for exam and cleaning in 6 months as scheduled for May 01, 2018.  4. Caregiver or Mother to call if problems arise before the next scheduled appointment.   Charlynne Panderonald F. Galina Haddox, DDS

## 2017-10-17 NOTE — Patient Instructions (Signed)
  Plan:  1. Patient and caregivers are to continue to brush teeth at least twice daily with fluoride toothpaste.  2. Patient was given a prescription for the use of Fixodent ultrahold denture adhesive for use with the upper denture. 3. Return to clinic for exam and cleaning in 6 months as scheduled for May 01, 2018.  4. Caregiver or Mother to call if problems arise before the next scheduled appointment.   Charlynne Panderonald F. Najat Olazabal, DDS

## 2017-12-09 ENCOUNTER — Emergency Department
Admission: EM | Admit: 2017-12-09 | Discharge: 2017-12-09 | Disposition: A | Discharge status: Home / Self Care | Payer: Medicare Other | Attending: Emergency Medicine | Admitting: Emergency Medicine

## 2017-12-09 ENCOUNTER — Emergency Department: Payer: Medicare Other

## 2017-12-09 DIAGNOSIS — I10 Essential (primary) hypertension: Secondary | ICD-10-CM | POA: Diagnosis not present

## 2017-12-09 DIAGNOSIS — W228XXA Striking against or struck by other objects, initial encounter: Secondary | ICD-10-CM | POA: Diagnosis not present

## 2017-12-09 DIAGNOSIS — S0181XA Laceration without foreign body of other part of head, initial encounter: Secondary | ICD-10-CM | POA: Insufficient documentation

## 2017-12-09 DIAGNOSIS — Z79899 Other long term (current) drug therapy: Secondary | ICD-10-CM | POA: Insufficient documentation

## 2017-12-09 DIAGNOSIS — Y998 Other external cause status: Secondary | ICD-10-CM | POA: Insufficient documentation

## 2017-12-09 DIAGNOSIS — S0990XA Unspecified injury of head, initial encounter: Secondary | ICD-10-CM | POA: Diagnosis present

## 2017-12-09 DIAGNOSIS — Z7902 Long term (current) use of antithrombotics/antiplatelets: Secondary | ICD-10-CM | POA: Diagnosis not present

## 2017-12-09 DIAGNOSIS — E119 Type 2 diabetes mellitus without complications: Secondary | ICD-10-CM | POA: Diagnosis not present

## 2017-12-09 DIAGNOSIS — Y92199 Unspecified place in other specified residential institution as the place of occurrence of the external cause: Secondary | ICD-10-CM | POA: Diagnosis not present

## 2017-12-09 DIAGNOSIS — Y9389 Activity, other specified: Secondary | ICD-10-CM | POA: Diagnosis not present

## 2017-12-09 DIAGNOSIS — Z7984 Long term (current) use of oral hypoglycemic drugs: Secondary | ICD-10-CM | POA: Insufficient documentation

## 2017-12-09 MED ORDER — LIDOCAINE HCL (PF) 1 % IJ SOLN
INTRAMUSCULAR | Status: DC
Start: 2017-12-09 — End: 2017-12-09
  Filled 2017-12-09: qty 5
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
## 2017-12-12 ENCOUNTER — Encounter: Payer: Medicare Other | Attending: Physician Assistant | Admitting: Physician Assistant

## 2017-12-12 DIAGNOSIS — Z881 Allergy status to other antibiotic agents status: Secondary | ICD-10-CM | POA: Insufficient documentation

## 2017-12-12 DIAGNOSIS — E11622 Type 2 diabetes mellitus with other skin ulcer: Secondary | ICD-10-CM | POA: Diagnosis not present

## 2017-12-12 DIAGNOSIS — E785 Hyperlipidemia, unspecified: Secondary | ICD-10-CM | POA: Insufficient documentation

## 2017-12-12 DIAGNOSIS — E1140 Type 2 diabetes mellitus with diabetic neuropathy, unspecified: Secondary | ICD-10-CM | POA: Insufficient documentation

## 2017-12-12 DIAGNOSIS — L8990 Pressure ulcer of unspecified site, unspecified stage: Secondary | ICD-10-CM | POA: Insufficient documentation

## 2017-12-22 NOTE — Progress Notes (Signed)
TAJI, BARRETTO ([id number]) Visit Report for 12/12/2017 Allergy List Details Patient Name: Daniel Mitchell, Daniel Mitchell. Date of Service: 12/12/2017 12:30 PM Medical Record Number: [id number] Patient Account Number: [id number] Date of Birth/Sex: 1965-12-19 (52 y.o. M) Treating RN: Curtis Sites Primary Care Ha Placeres: Einar Crow Other Clinician: Referring Dmetrius Ambs: Einar Crow Treating Jermiya Reichl/Extender: STONE III, HOYT Weeks in Treatment: 0 Allergies Active Allergies Zithromax Topamax Allergy Notes Electronic Signature(s) Signed: 12/12/2017 5:07:08 PM By: Curtis Sites Entered By: Curtis Sites on 12/12/2017 12:54:47 Daniel Mitchell ([id number]) -------------------------------------------------------------------------------- Arrival Information Details Patient Name: Daniel Mitchell Date of Service: 12/12/2017 12:30 PM Medical Record Number: [id number] Patient Account Number: [id number] Date of Birth/Sex: Dec 27, 1965 (52 y.o. M) Treating RN: Curtis Sites Primary Care Naiah Donahoe: Einar Crow Other Clinician: Referring Sammy Cassar: Einar Crow Treating Erabella Kuipers/Extender: Linwood Dibbles, HOYT Weeks in Treatment: 0 Visit Information Patient Arrived: Wheel Chair Arrival Time: 12:50 Accompanied By: caregivers and mother Transfer Assistance: Manual Patient Identification Verified: Yes Secondary Verification Process Yes Completed: History Since Last Visit Added or deleted any medications: No Any new allergies or adverse reactions: No Had a fall or experienced change in activities of daily living that may affect risk of falls: No Signs or symptoms of abuse/neglect since last visito No Hospitalized since last visit: No Implantable device outside of the clinic excluding cellular tissue based products placed in the center since last visit: No Has Dressing in Place as Prescribed: Yes Electronic Signature(s) Signed: 12/12/2017 5:07:08 PM By: Curtis Sites Entered  By: Curtis Sites on 12/12/2017 12:51:35 Daniel Mitchell ([id number]) -------------------------------------------------------------------------------- Clinic Level of Care Assessment Details Patient Name: Daniel Mitchell Date of Service: 12/12/2017 12:30 PM Medical Record Number: [id number] Patient Account Number: [id number] Date of Birth/Sex: [birth year] (52 y.o. M) Treating RN: Phillis Haggis Primary Care Kailie Polus: Einar Crow Other Clinician: Referring Kearstin Learn: Einar Crow Treating Laszlo Ellerby/Extender: Linwood Dibbles, HOYT Weeks in Treatment: 0 Clinic Level of Care Assessment Items TOOL 2 Quantity Score X - Use when only an EandM is performed on the INITIAL visit 1 0 ASSESSMENTS - Nursing Assessment / Reassessment X - General Physical Exam (combine w/ comprehensive assessment (listed just below) when 1 20 performed on new pt. evals) X- 1 25 Comprehensive Assessment (HX, ROS, Risk Assessments, Wounds Hx, etc.) ASSESSMENTS - Wound and Skin Assessment / Reassessment []  - Simple Wound Assessment / Reassessment - one wound 0 []  - 0 Complex Wound Assessment / Reassessment - multiple wounds []  - 0 Dermatologic / Skin Assessment (not related to wound area) ASSESSMENTS - Ostomy and/or Continence Assessment and Care []  - Incontinence Assessment and Management 0 []  - 0 Ostomy Care Assessment and Management (repouching, etc.) PROCESS - Coordination of Care X - Simple Patient / Family Education for ongoing care 1 15 []  - 0 Complex (extensive) Patient / Family Education for ongoing care []  - 0 Staff obtains Chiropractor, Records, Test Results / Process Orders []  - 0 Staff telephones HHA, Nursing Homes / Clarify orders / etc []  - 0 Routine Transfer to another Facility (non-emergent condition) []  - 0 Routine Hospital Admission (non-emergent condition) []  - 0 New Admissions / Manufacturing engineer / Ordering NPWT, Apligraf, etc. []  - 0 Emergency Hospital Admission  (emergent condition) X- 1 10 Simple Discharge Coordination []  - 0 Complex (extensive) Discharge Coordination PROCESS - Special Needs []  - Pediatric / Minor Patient Management 0 []  - 0 Isolation Patient Management KIPPER, BUCH. ([id number]) []  - 0 Hearing / Language / Visual special needs []  - 0 Assessment of Community assistance (transportation, D/C planning,  etc.) []  - 0 Additional assistance / Altered mentation []  - 0 Support Surface(s) Assessment (bed, cushion, seat, etc.) INTERVENTIONS - Wound Cleansing / Measurement []  - Wound Imaging (photographs - any number of wounds) 0 []  - 0 Wound Tracing (instead of photographs) []  - 0 Simple Wound Measurement - one wound []  - 0 Complex Wound Measurement - multiple wounds []  - 0 Simple Wound Cleansing - one wound []  - 0 Complex Wound Cleansing - multiple wounds INTERVENTIONS - Wound Dressings []  - Small Wound Dressing one or multiple wounds 0 []  - 0 Medium Wound Dressing one or multiple wounds []  - 0 Large Wound Dressing one or multiple wounds []  - 0 Application of Medications - injection INTERVENTIONS - Miscellaneous []  - External ear exam 0 []  - 0 Specimen Collection (cultures, biopsies, blood, body fluids, etc.) []  - 0 Specimen(s) / Culture(s) sent or taken to Lab for analysis []  - 0 Patient Transfer (multiple staff / Nurse, adultHoyer Lift / Similar devices) []  - 0 Simple Staple / Suture removal (25 or less) []  - 0 Complex Staple / Suture removal (26 or more) []  - 0 Hypo / Hyperglycemic Management (close monitor of Blood Glucose) []  - 0 Ankle / Brachial Index (ABI) - do not check if billed separately Has the patient been seen at the hospital within the last three years: Yes Total Score: 70 Level Of Care: New/Established - Level 2 Electronic Signature(s) Signed: 12/13/2017 4:43:18 PM By: Alejandro MullingPinkerton, Debra Entered By: Alejandro MullingPinkerton, Debra on 12/12/2017 14:33:49 Daniel OvensGAITHER, Daniel B.  ([id number]) -------------------------------------------------------------------------------- Encounter Discharge Information Details Patient Name: Daniel OvensGAITHER, Daniel B. Date of Service: 12/12/2017 12:30 PM Medical Record Number: [id number] Patient Account Number: [id number] Date of Birth/Sex: 09/12/1965 (52 y.o. M) Treating RN: Phillis HaggisPinkerton, Debi Primary Care Charlton Boule: Einar CrowAnderson, Marshall Other Clinician: Referring Yuta Cipollone: Einar CrowAnderson, Marshall Treating Warden Buffa/Extender: Linwood DibblesSTONE III, HOYT Weeks in Treatment: 0 Encounter Discharge Information Items Discharge Condition: Stable Ambulatory Status: Wheelchair Discharge Destination: Home Transportation: Private Auto Accompanied By: caregivers, mother Schedule Follow-up Appointment: No Clinical Summary of Care: Electronic Signature(s) Signed: 12/13/2017 4:43:18 PM By: Alejandro MullingPinkerton, Debra Entered By: Alejandro MullingPinkerton, Debra on 12/12/2017 13:30:58 Daniel OvensGAITHER, Daniel B. ([id number]) -------------------------------------------------------------------------------- Multi Wound Chart Details Patient Name: Daniel OvensGAITHER, Daniel B. Date of Service: 12/12/2017 12:30 PM Medical Record Number: [id number] Patient Account Number: [id number] Date of Birth/Sex: 03/12/1966 (52 y.o. M) Treating RN: Phillis HaggisPinkerton, Debi Primary Care Laronn Devonshire: Einar CrowAnderson, Marshall Other Clinician: Referring Jarielys Girardot: Einar CrowAnderson, Marshall Treating Jerrin Recore/Extender: Linwood DibblesSTONE III, HOYT Weeks in Treatment: 0 Vital Signs Height(in): Pulse(bpm): 82 Weight(lbs): Blood Pressure(mmHg): 122/55 Body Mass Index(BMI): Temperature(F): 98.9 Respiratory Rate 16 (breaths/min): Wound Assessments Treatment Notes Electronic Signature(s) Signed: 12/13/2017 4:43:18 PM By: Alejandro MullingPinkerton, Debra Entered By: Alejandro MullingPinkerton, Debra on 12/12/2017 13:28:07 Daniel OvensGAITHER, Daniel B. ([id number]) -------------------------------------------------------------------------------- Multi-Disciplinary Care Plan Details Patient Name: Daniel OvensGAITHER, Daniel  B. Date of Service: 12/12/2017 12:30 PM Medical Record Number: [id number] Patient Account Number: [id number] Date of Birth/Sex: 03/01/1966 (52 y.o. M) Treating RN: Phillis HaggisPinkerton, Debi Primary Care Lauralyn Shadowens: Einar CrowAnderson, Marshall Other Clinician: Referring Acelin Ferdig: Einar CrowAnderson, Marshall Treating Ranie Chinchilla/Extender: Linwood DibblesSTONE III, HOYT Weeks in Treatment: 0 Active Inactive Electronic Signature(s) Signed: 12/13/2017 4:43:18 PM By: Alejandro MullingPinkerton, Debra Entered By: Alejandro MullingPinkerton, Debra on 12/12/2017 13:28:01 Daniel OvensGAITHER, Daniel B. ([id number]) -------------------------------------------------------------------------------- Pain Assessment Details Patient Name: Daniel OvensGAITHER, Daniel B. Date of Service: 12/12/2017 12:30 PM Medical Record Number: [id number] Patient Account Number: [id number] Date of Birth/Sex: 11/05/1965 (52 y.o. M) Treating RN: Curtis Sitesorthy, Joanna Primary Care Dael Howland: Einar CrowAnderson, Marshall Other Clinician: Referring Desmond Tufano: Einar CrowAnderson, Marshall Treating Lezley Bedgood/Extender: STONE III, HOYT Weeks in Treatment: 0 Active Problems Location of Pain Severity  and Description of Pain Patient Has Paino Patient Unable to Respond Site Locations Pain Management and Medication Current Pain Management: Electronic Signature(s) Signed: 12/12/2017 5:07:08 PM By: Curtis Sites Entered By: Curtis Sites on 12/12/2017 12:52:27 Daniel Mitchell ([id number]) -------------------------------------------------------------------------------- Patient/Caregiver Education Details Patient Name: Daniel Mitchell Date of Service: 12/12/2017 12:30 PM Medical Record Number: [id number] Patient Account Number: [id number] Date of Birth/Gender: [birth year] (52 y.o. M) Treating RN: Phillis Haggis Primary Care Physician: Einar Crow Other Clinician: Referring Physician: Einar Crow Treating Physician/Extender: Linwood Dibbles, HOYT Weeks in Treatment: 0 Education Assessment Education Provided To: Caregiver Education Topics  Provided Wound/Skin Impairment: Handouts: Other: Contact our office if you have any questions or concerns. Electronic Signature(s) Signed: 12/13/2017 4:43:18 PM By: Alejandro Mulling Entered By: Alejandro Mulling on 12/12/2017 13:31:28 Daniel Mitchell ([id number]) -------------------------------------------------------------------------------- Vitals Details Patient Name: Daniel Mitchell Date of Service: 12/12/2017 12:30 PM Medical Record Number: [id number] Patient Account Number: [id number] Date of Birth/Sex: 04/02/1966 (52 y.o. M) Treating RN: Curtis Sites Primary Care Chivon Lepage: Einar Crow Other Clinician: Referring Liona Wengert: Einar Crow Treating Deforrest Bogle/Extender: Linwood Dibbles, HOYT Weeks in Treatment: 0 Vital Signs Time Taken: 12:53 Temperature (F): 98.9 Pulse (bpm): 82 Respiratory Rate (breaths/min): 16 Blood Pressure (mmHg): 122/55 Reference Range: 80 - 120 mg / dl Electronic Signature(s) Signed: 12/12/2017 5:07:08 PM By: Curtis Sites Entered By: Curtis Sites on 12/12/2017 12:53:34

## 2017-12-22 NOTE — Progress Notes (Signed)
THUNDER, BRIDGEWATER ([id number]) Visit Report for 12/12/2017 Chief Complaint Document Details Patient Name: Daniel Mitchell, Daniel Mitchell. Date of Service: 12/12/2017 12:30 PM Medical Record Number: [id number] Patient Account Number: [id number] Date of Birth/Sex: 10/29/1965 (52 y.o. M) Treating RN: Phillis Haggis Primary Care Provider: Einar Crow Other Clinician: Referring Provider: Einar Crow Treating Provider/Extender: Linwood Dibbles, Damaya Channing Weeks in Treatment: 0 Information Obtained from: Caregiver Chief Complaint Gluteal skin discoloration Electronic Signature(s) Signed: 12/13/2017 12:11:53 PM By: Lenda Kelp PA-C Entered By: Lenda Kelp on 12/12/2017 17:45:03 Daniel Mitchell ([id number]) -------------------------------------------------------------------------------- HPI Details Patient Name: Daniel Mitchell Date of Service: 12/12/2017 12:30 PM Medical Record Number: [id number] Patient Account Number: [id number] Date of Birth/Sex: 10/24/1965 (52 y.o. M) Treating RN: Phillis Haggis Primary Care Provider: Einar Crow Other Clinician: Referring Provider: Einar Crow Treating Provider/Extender: Linwood Dibbles, Markeith Jue Weeks in Treatment: 0 History of Present Illness HPI Description: this very pleasant 52 year old gentleman comes along with his mother and 2 caregivers from a group living place. they have noticed a area on his right gluteal region which is painful and has some white material over it. He had been treating it as a pressure ulcer. No discharge and no fever. He has also had a few other pustules all over his body namely the face and the thighs. 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 He is also taking Septra which was given to him for a urinary tract infection. They're been doing his dressings as before. 06/26/14 - the patient is fine and his mother and caregiver both say that  he is doing well. 07/17/14 no fresh issues and they seem to be doing very well. Readmission: 12/12/17 on evaluation today patient presents for initial evaluation after not having been here for a little over three years for a completely separate issue than what he previously was seen as four. He actually sustained an injury in May 2019 on the left hip when he had a trauma that led to a hematoma. Subsequently this open up and to a wound which was managed it appears quite effectively over the months following from then until now. At this point patient does not seem to have any evidence of infection at this time in fact there's not even a wound opening on the left hip although there is definitely evidence of scarring from a previous wound. Fortunately there's no evidence of anything more significant going on. He was evaluated by way of an x-ray as well the left hip which revealed a non-symptomatic lesion on x-ray but nothing acute. The good news is again he has actually completely healed and there does not appear to be an issue at this point. The unfortunate thing is that he does have some irritation in regard to the sacral/gluteal region which appears to be more the pressure injury due to likely him sitting for long periods of time. That is something that I did address with the patient's family caretakers today. Electronic Signature(s) Signed: 12/13/2017 12:11:53 PM By: Lenda Kelp PA-C Entered By: Lenda Kelp on 12/12/2017 17:47:57 Daniel Mitchell ([id number]) -------------------------------------------------------------------------------- Physical Exam Details Patient Name: Daniel Mitchell Date of Service: 12/12/2017 12:30 PM Medical Record Number: [id number] Patient Account Number: [id number] Date of Birth/Sex: 03-22-1966 (52 y.o. M) Treating RN: Phillis Haggis Primary Care Provider: Einar Crow Other Clinician: Referring Provider: Einar Crow Treating  Provider/Extender: STONE III, Mirage Pfefferkorn Weeks in Treatment: 0 Constitutional supine blood  pressure is within target range for patient.. pulse regular and within target range for patient.Marland Kitchen respirations regular, non-labored and within target range for patient.Marland Kitchen temperature within target range for patient.. Chronically ill appearing but in no apparent acute distress. Eyes conjunctiva clear no eyelid edema noted. pupils equal round and reactive to light and accommodation. Ears, Nose, Mouth, and Throat no gross abnormality of ear auricles or external auditory canals. normal hearing noted during conversation. mucus membranes moist. Respiratory normal breathing without difficulty. clear to auscultation bilaterally. Cardiovascular no bruits with no significant JVD. no clubbing, cyanosis, significant edema, <3 sec Daniel Mitchell refill. Gastrointestinal (GI) soft, non-tender, non-distended, +BS. no ventral hernia noted. Musculoskeletal Patient unable to walk without assistance. Psychiatric Patient is not able to cooperate in decision making regarding care. Patient is oriented to person only. pleasant and cooperative. Notes On inspection today patient did have blanchable erythema noted of the lower sacral/gluteal region which does have me concerned in regard to the possibility of a pressure injury opening. Nonetheless I did not see anything right now that leads me to believe that there's anything more significant going on such as a true pressure injury although I do believe he is at risk for this based on what I'm seeing. In regard to the left hip there was an area where there was some resolving scar tissue but did not appear to be any evidence of acute injury nor opening. Electronic Signature(s) Signed: 12/13/2017 12:11:53 PM By: Lenda Kelp PA-C Entered By: Lenda Kelp on 12/12/2017 17:49:14 Daniel Mitchell  ([id number]) -------------------------------------------------------------------------------- Physician Orders Details Patient Name: Daniel Mitchell Date of Service: 12/12/2017 12:30 PM Medical Record Number: [id number] Patient Account Number: [id number] Date of Birth/Sex: 1966-03-11 (52 y.o. M) Treating RN: Phillis Haggis Primary Care Provider: Einar Crow Other Clinician: Referring Provider: Einar Crow Treating Provider/Extender: Linwood Dibbles, Rayaan Lorah Weeks in Treatment: 0 Verbal / Phone Orders: Yes Clinician: Ashok Cordia, Debi Read Back and Verified: Yes Diagnosis Coding Discharge From [facility] Services o Discharge from Wound Care Center - Please call our office if you have any questions or concerns. Patient Medications Allergies: Zithromax, Topamax Notifications Medication Indication Start End Zinc Oxide Diaper Cream 12/12/2017 DOSE topical 1 %-10 % cream - cream topical applied to the bilateral gluteal region with each brief change to help with skin protection from moisture and irritation Electronic Signature(s) Signed: 12/12/2017 1:46:07 PM By: Lenda Kelp PA-C Entered By: Lenda Kelp on 12/12/2017 13:46:07 Daniel Mitchell ([id number]) -------------------------------------------------------------------------------- Problem List Details Patient Name: Daniel Mitchell Date of Service: 12/12/2017 12:30 PM Medical Record Number: [id number] Patient Account Number: [id number] Date of Birth/Sex: 20-Nov-1965 (52 y.o. M) Treating RN: Phillis Haggis Primary Care Provider: Einar Crow Other Clinician: Referring Provider: Einar Crow Treating Provider/Extender: Linwood Dibbles, Delbra Zellars Weeks in Treatment: 0 Active Problems ICD-10 Evaluated Encounter Code Description Active Date Today Diagnosis L98.8 Other specified disorders of the skin and subcutaneous 12/12/2017 No Yes tissue R41.841 Cognitive communication deficit 12/12/2017 No Yes Inactive  Problems Resolved Problems Electronic Signature(s) Signed: 12/13/2017 12:11:53 PM By: Lenda Kelp PA-C Entered By: Lenda Kelp on 12/12/2017 17:37:07 Daniel Mitchell ([id number]) -------------------------------------------------------------------------------- Progress Note Details Patient Name: Daniel Mitchell Date of Service: 12/12/2017 12:30 PM Medical Record Number: [id number] Patient Account Number: [id number] Date of Birth/Sex: [birth year] (52 y.o. M) Treating RN: Phillis Haggis Primary Care Provider: Einar Crow Other Clinician: Referring Provider: Einar Crow Treating Provider/Extender: Linwood Dibbles, Derreck Wiltsey Weeks in Treatment: 0 Subjective Chief Complaint Information obtained from Caregiver Gluteal skin discoloration  History of Present Illness (HPI) this very pleasant 52 year old gentleman comes along with his mother and 2 caregivers from a group living place. they have noticed a area on his right gluteal region which is painful and has some white material over it. He had been treating it as a pressure ulcer. No discharge and no fever. He has also had a few other pustules all over his body namely the face and the thighs. 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 He is also taking Septra which was given to him for a urinary tract infection. They're been doing his dressings as before. 06/26/14 - the patient is fine and his mother and caregiver both say that he is doing well. 07/17/14 no fresh issues and they seem to be doing very well. Readmission: 12/12/17 on evaluation today patient presents for initial evaluation after not having been here for a little over three years for a completely separate issue than what he previously was seen as four. He actually sustained an injury in May 2019 on the left hip when he had a trauma that led to a hematoma. Subsequently this open up and to a  wound which was managed it appears quite effectively over the months following from then until now. At this point patient does not seem to have any evidence of infection at this time in fact there's not even a wound opening on the left hip although there is definitely evidence of scarring from a previous wound. Fortunately there's no evidence of anything more significant going on. He was evaluated by way of an x-ray as well the left hip which revealed a non-symptomatic lesion on x-ray but nothing acute. The good news is again he has actually completely healed and there does not appear to be an issue at this point. The unfortunate thing is that he does have some irritation in regard to the sacral/gluteal region which appears to be more the pressure injury due to likely him sitting for long periods of time. That is something that I did address with the patient's family caretakers today. Wound History Patient reportedly has not tested positive for osteomyelitis. Patient reportedly has not had testing performed to evaluate circulation in the legs. Patient History Information obtained from Patient. Allergies Zithromax, Topamax Family History Cancer - Father, Heart Disease - Father, Hypertension - Mother, Kidney Disease - Father, Lung Disease - Father, Stroke - Father, No family history of Diabetes, Hereditary Spherocytosis, Thyroid Problems, Tuberculosis. Social History Never smoker, Marital Status - Single, Alcohol Use - Never, Drug Use - No History, Caffeine Use - Daily - caffeniate coke. Daniel Mitchell, Daniel Mitchell ([id number]) Medical History Eyes Denies history of Cataracts, Glaucoma, Optic Neuritis Ear/Nose/Mouth/Throat Denies history of Chronic sinus problems/congestion, Middle ear problems Hematologic/Lymphatic Denies history of Anemia, Hemophilia, Human Immunodeficiency Virus, Lymphedema, Sickle Cell Disease Respiratory Denies history of Aspiration, Asthma, Chronic Obstructive Pulmonary  Disease (COPD), Pneumothorax, Sleep Apnea, Tuberculosis Cardiovascular Denies history of Angina, Arrhythmia, Congestive Heart Failure, Coronary Artery Disease, Deep Vein Thrombosis, Hypotension, Myocardial Infarction, Peripheral Arterial Disease, Peripheral Venous Disease, Phlebitis, Vasculitis Gastrointestinal Denies history of Cirrhosis , Colitis, Crohn s, Hepatitis A, Hepatitis B, Hepatitis C Endocrine Denies history of Type I Diabetes Integumentary (Skin) Denies history of History of Burn Neurologic Denies history of Dementia, Quadriplegia, Paraplegia, Seizure Disorder Psychiatric Denies history of Anorexia/bulimia, Confinement Anxiety Blood sugar is not tested. Medical And Surgical History Notes Cardiovascular hyperlipidemia Genitourinary chronic UTI  Integumentary (Skin) history of wounds Neurologic static encephalopathy, cerebellar ataxia, parkinsonian features Psychiatric cognitive disorder Review of Systems (ROS) Constitutional Symptoms (General Health) The patient has no complaints or symptoms. Eyes Denies complaints or symptoms of Dry Eyes, Vision Changes, Glasses / Contacts. Ear/Nose/Mouth/Throat Denies complaints or symptoms of Difficult clearing ears, Sinusitis. Hematologic/Lymphatic Denies complaints or symptoms of Bleeding / Clotting Disorders, Human Immunodeficiency Virus. Respiratory Denies complaints or symptoms of Chronic or frequent coughs, Shortness of Breath. Cardiovascular Denies complaints or symptoms of Chest pain, LE edema. Gastrointestinal Denies complaints or symptoms of Frequent diarrhea, Nausea, Vomiting. Endocrine Denies complaints or symptoms of Hepatitis, Thyroid disease, Polydypsia (Excessive Thirst). Genitourinary Denies complaints or symptoms of Kidney failure/ Dialysis, Incontinence/dribbling. Immunological Denies complaints or symptoms of Hives, Itching. Daniel Mitchell, Daniel Mitchell ([id number]) Integumentary (Skin) Denies complaints or  symptoms of Wounds, Bleeding or bruising tendency, Breakdown, Swelling. Musculoskeletal Denies complaints or symptoms of Muscle Pain, Muscle Weakness. Neurologic Denies complaints or symptoms of Numbness/parasthesias, Focal/Weakness. Psychiatric Denies complaints or symptoms of Anxiety, Claustrophobia. Objective Constitutional supine blood pressure is within target range for patient.. pulse regular and within target range for patient.Marland Kitchen respirations regular, non-labored and within target range for patient.Marland Kitchen temperature within target range for patient.. Chronically ill appearing but in no apparent acute distress. Vitals Time Taken: 12:53 PM, Temperature: 98.9 F, Pulse: 82 bpm, Respiratory Rate: 16 breaths/min, Blood Pressure: 122/55 mmHg. Eyes conjunctiva clear no eyelid edema noted. pupils equal round and reactive to light and accommodation. Ears, Nose, Mouth, and Throat no gross abnormality of ear auricles or external auditory canals. normal hearing noted during conversation. mucus membranes moist. Respiratory normal breathing without difficulty. clear to auscultation bilaterally. Cardiovascular no bruits with no significant JVD. no clubbing, cyanosis, significant edema, Gastrointestinal (GI) soft, non-tender, non-distended, +BS. no ventral hernia noted. Musculoskeletal Patient unable to walk without assistance. Psychiatric Patient is not able to cooperate in decision making regarding care. Patient is oriented to person only. pleasant and cooperative. General Notes: On inspection today patient did have blanchable erythema noted of the lower sacral/gluteal region which does have me concerned in regard to the possibility of a pressure injury opening. Nonetheless I did not see anything right now that leads me to believe that there's anything more significant going on such as a true pressure injury although I do believe he is at risk for this based on what I'm seeing. In regard to the  left hip there was an area where there was some resolving scar tissue but did not appear to be any evidence of acute injury nor opening. Daniel Mitchell, Daniel Mitchell ([id number]) Assessment Active Problems ICD-10 Other specified disorders of the skin and subcutaneous tissue Cognitive communication deficit Plan Discharge From [facility] Services: Discharge from Wound Care Center - Please call our office if you have any questions or concerns. The following medication(s) was prescribed: Zinc Oxide Diaper Cream topical 1 %-10 % cream cream topical applied to the bilateral gluteal region with each brief change to help with skin protection from moisture and irritation starting 12/12/2017 At this point my suggestion is gonna be that we go ahead and have the caretakers initiate zinc oxide as a barrier cream for the sacral/gluteal region in order to help prevent moisture and protect from pressure. There in agreement the plan. Will see him back as needed in the future and his mother is in agreement with the plan. I did send in a prescription for the zinc oxide cream in order to help with the barrier and this was sent to [facility] per patient's family request so this could be administered at the facility. Electronic Signature(s) Signed: 12/13/2017 12:11:53 PM By: Lenda KelpStone III, Andrina Locken PA-C Entered By: Lenda KelpStone III, Sholom Dulude on 12/12/2017 17:50:12 Daniel Mitchell, Daniel B. ([id number]) -------------------------------------------------------------------------------- ROS/PFSH Details Patient Name: Daniel Mitchell, Daniel B. Date of Service: 12/12/2017 12:30 PM Medical Record Number: [id number] Patient Account Number: [id number] Date of Birth/Sex: 06/20/1965 (52 y.o. M) Treating RN: Curtis Sitesorthy, Joanna Primary Care Provider: Einar CrowAnderson, Marshall Other Clinician: Referring Provider: Einar CrowAnderson, Marshall Treating Provider/Extender: Linwood DibblesSTONE III, Choya Tornow Weeks in Treatment: 0 Information Obtained From Patient Wound History Do you currently have one or more  open woundso No Have you tested positive for osteomyelitis (bone infection)o No Have you had any tests for circulation on your legso No Constitutional Symptoms (General Health) Complaints and Symptoms: No Complaints or Symptoms Complaints and Symptoms: Negative for: Fatigue; Fever; Chills; Marked Weight Change Eyes Complaints and Symptoms: Negative for: Dry Eyes; Vision Changes; Glasses / Contacts Medical History: Negative for: Cataracts; Glaucoma; Optic Neuritis Ear/Nose/Mouth/Throat Complaints and Symptoms: Negative for: Difficult clearing ears; Sinusitis Medical History: Negative for: Chronic sinus problems/congestion; Middle ear problems Hematologic/Lymphatic Complaints and Symptoms: Negative for: Bleeding / Clotting Disorders; Human Immunodeficiency Virus Medical History: Negative for: Anemia; Hemophilia; Human Immunodeficiency Virus; Lymphedema; Sickle Cell Disease Respiratory Complaints and Symptoms: Negative for: Chronic or frequent coughs; Shortness of Breath Medical History: Negative for: Aspiration; Asthma; Chronic Obstructive Pulmonary Disease (COPD); Pneumothorax; Sleep Apnea; Tuberculosis Cardiovascular Daniel Mitchell, Daniel B. ([id number]) Complaints and Symptoms: Negative for: Chest pain; LE edema Medical History: Positive for: Hypertension Negative for: Angina; Arrhythmia; Congestive Heart Failure; Coronary Artery Disease; Deep Vein Thrombosis; Hypotension; Myocardial Infarction; Peripheral Arterial Disease; Peripheral Venous Disease; Phlebitis; Vasculitis Past Medical History Notes: hyperlipidemia Gastrointestinal Complaints and Symptoms: Negative for: Frequent diarrhea; Nausea; Vomiting Medical History: Negative for: Cirrhosis ; Colitis; Crohnos; Hepatitis A; Hepatitis B; Hepatitis C Endocrine Complaints and Symptoms: Negative for: Hepatitis; Thyroid disease; Polydypsia (Excessive Thirst) Medical History: Positive for: Type II Diabetes Negative for: Type  I Diabetes Blood sugar tested every day: No Genitourinary Complaints and Symptoms: Negative for: Kidney failure/ Dialysis; Incontinence/dribbling Medical History: Negative for: End Stage Renal Disease Past Medical History Notes: chronic UTI Immunological Complaints and Symptoms: Negative for: Hives; Itching Medical History: Negative for: Lupus Erythematosus; Raynaudos; Scleroderma Integumentary (Skin) Complaints and Symptoms: Negative for: Wounds; Bleeding or bruising tendency; Breakdown; Swelling Medical History: Positive for: History of pressure wounds Negative for: History of Burn Past Medical History Notes: history of wounds Musculoskeletal Daniel Mitchell, Daniel B. ([id number]) Complaints and Symptoms: Negative for: Muscle Pain; Muscle Weakness Medical History: Negative for: Gout; Rheumatoid Arthritis; Osteoarthritis; Osteomyelitis Neurologic Complaints and Symptoms: Negative for: Numbness/parasthesias; Focal/Weakness Medical History: Positive for: Neuropathy Negative for: Dementia; Quadriplegia; Paraplegia; Seizure Disorder Past Medical History Notes: static encephalopathy, cerebellar ataxia, parkinsonian features Psychiatric Complaints and Symptoms: Negative for: Anxiety; Claustrophobia Medical History: Negative for: Anorexia/bulimia; Confinement Anxiety Past Medical History Notes: cognitive disorder Oncologic Medical History: Negative for: Received Chemotherapy; Received Radiation Immunizations Pneumococcal Vaccine: Received Pneumococcal Vaccination: Yes Implantable Devices Family and Social History Cancer: Yes - Father; Diabetes: No; Heart Disease: Yes - Father; Hereditary Spherocytosis: No; Hypertension: Yes - Mother; Kidney Disease: Yes - Father; Lung Disease: Yes - Father; Stroke: Yes - Father; Thyroid Problems: No; Tuberculosis: No; Never smoker; Marital Status - Single; Alcohol Use: Never; Drug Use: No History; Caffeine Use: Daily - caffeniate  coke; Financial Concerns: No; Food, Clothing or Shelter Needs: No; Support System Lacking: No; Transportation Concerns: No; Advanced Directives: No; Patient does not want information on Advanced  Directives; Living Will: No; Medical Power of Attorney: Yes - mother (Not Provided) Electronic Signature(s) Signed: 12/12/2017 5:07:08 PM By: Curtis Sites Signed: 12/13/2017 12:11:53 PM By: Lenda Kelp PA-C Entered By: Curtis Sites on 12/12/2017 13:00:41 Daniel Mitchell ([id number]) -------------------------------------------------------------------------------- SuperBill Details Patient Name: Daniel Mitchell Date of Service: 12/12/2017 Medical Record Number: [id number] Patient Account Number: [id number] Date of Birth/Sex: 07/24/1965 (52 y.o. M) Treating RN: Phillis Haggis Primary Care Provider: Einar Crow Other Clinician: Referring Provider: Einar Crow Treating Provider/Extender: Linwood Dibbles, Clem Wisenbaker Weeks in Treatment: 0 Diagnosis Coding ICD-10 Codes Code Description L98.8 Other specified disorders of the skin and subcutaneous tissue R41.841 Cognitive communication deficit Facility Procedures CPT4 Code: [id number] Description: [phone] - WOUND CARE VISIT-LEV 2 EST PT Modifier: Quantity: 1 Physician Procedures CPT4 Code: [id number] Description: WC PHYS LEVEL 3 o NEW PT ICD-10 Diagnosis Description L98.8 Other specified disorders of the skin and subcutaneous R41.841 Cognitive communication deficit Modifier: tissue Quantity: 1 Electronic Signature(s) Signed: 12/13/2017 12:11:53 PM By: Lenda Kelp PA-C Entered By: Lenda Kelp on 12/12/2017 17:50:26

## 2017-12-22 NOTE — Progress Notes (Signed)
Daniel Mitchell, Daniel Mitchell ([id number]) Visit Report for 12/12/2017 Abuse/Suicide Risk Screen Details Patient Name: Daniel Mitchell, Daniel Mitchell. Date of Service: 12/12/2017 12:30 PM Medical Record Number: [id number] Patient Account Number: [id number] Date of Birth/Sex: 1965/10/07 (52 y.o. M) Treating RN: Curtis Sites Primary Care Hideko Esselman: Einar Crow Other Clinician: Referring Mitra Duling: Einar Crow Treating Zanna Hawn/Extender: Linwood Dibbles, HOYT Weeks in Treatment: 0 Abuse/Suicide Risk Screen Items Answer ABUSE/SUICIDE RISK SCREEN: Has anyone close to you tried to hurt or harm you recentlyo No Do you feel uncomfortable with anyone in your familyo No Has anyone forced you do things that you didnot want to doo No Do you have any thoughts of harming yourselfo No Patient displays signs or symptoms of abuse and/or neglect. No Electronic Signature(s) Signed: 12/12/2017 5:07:08 PM By: Curtis Sites Entered By: Curtis Sites on 12/12/2017 13:00:52 Daniel Mitchell ([id number]) -------------------------------------------------------------------------------- Activities of Daily Living Details Patient Name: Daniel Mitchell, Daniel Mitchell. Date of Service: 12/12/2017 12:30 PM Medical Record Number: [id number] Patient Account Number: [id number] Date of Birth/Sex: 1965/10/01 (52 y.o. M) Treating RN: Curtis Sites Primary Care Ronaldo Crilly: Einar Crow Other Clinician: Referring Maggy Wyble: Einar Crow Treating Darnel Mchan/Extender: Linwood Dibbles, HOYT Weeks in Treatment: 0 Activities of Daily Living Items Answer Activities of Daily Living (Please select one for each item) Drive Automobile Not Able Take Medications Need Assistance Use Telephone Not Able Care for Appearance Need Assistance Use Toilet Need Assistance Bath / Shower Need Assistance Dress Self Need Assistance Feed Self Completely Able Walk Not Able Get In / Out Bed Need Assistance Housework Not Able Prepare Meals Not Able Handle Money  Not Able Shop for Self Not Able Electronic Signature(s) Signed: 12/12/2017 5:07:08 PM By: Curtis Sites Entered By: Curtis Sites on 12/12/2017 13:01:29 Daniel Mitchell ([id number]) -------------------------------------------------------------------------------- Education Assessment Details Patient Name: Daniel Mitchell Date of Service: 12/12/2017 12:30 PM Medical Record Number: [id number] Patient Account Number: [id number] Date of Birth/Sex: Sep 12, 1965 (52 y.o. M) Treating RN: Curtis Sites Primary Care Nykeria Mealing: Einar Crow Other Clinician: Referring Noa Galvao: Einar Crow Treating Dann Ventress/Extender: Linwood Dibbles, HOYT Weeks in Treatment: 0 Primary Learner Assessed: Caregiver staff wound location and mental Reason Patient is not Primary Learner: status Learning Preferences/Education Level/Primary Language Learning Preference: Explanation, Demonstration, Printed Material Highest Education Level: High School Preferred Language: English Cognitive Barrier Assessment/Beliefs Language Barrier: No Translator Needed: No Memory Deficit: No Emotional Barrier: No Cultural/Religious Beliefs Affecting Medical Care: No Physical Barrier Assessment Impaired Vision: No Impaired Hearing: No Decreased Hand dexterity: No Knowledge/Comprehension Assessment Knowledge Level: Medium Comprehension Level: Medium Ability to understand written Medium instructions: Ability to understand verbal Medium instructions: Motivation Assessment Anxiety Level: Calm Cooperation: Cooperative Education Importance: Acknowledges Need Interest in Health Problems: Asks Questions Perception: Coherent Willingness to Engage in Self- Medium Management Activities: Readiness to Engage in Self- Medium Management Activities: Electronic Signature(s) Signed: 12/12/2017 5:07:08 PM By: Curtis Sites Entered By: Curtis Sites on 12/12/2017 13:02:07 Daniel Mitchell  ([id number]) -------------------------------------------------------------------------------- Fall Risk Assessment Details Patient Name: Daniel Mitchell Date of Service: 12/12/2017 12:30 PM Medical Record Number: [id number] Patient Account Number: [id number] Date of Birth/Sex: 04-15-1966 (52 y.o. M) Treating RN: Curtis Sites Primary Care Raidyn Breiner: Einar Crow Other Clinician: Referring Janan Bogie: Einar Crow Treating Anuja Manka/Extender: Linwood Dibbles, HOYT Weeks in Treatment: 0 Fall Risk Assessment Items Have you had 2 or more falls in the last 12 monthso 0 No Have you had any fall that resulted in injury in the last 12 monthso 0 No FALL RISK ASSESSMENT: History of falling - immediate or within 3 months 25 Yes Secondary  diagnosis 0 No Ambulatory aid None/bed rest/wheelchair/nurse 0 Yes Crutches/cane/walker 0 No Furniture 0 No IV Access/Saline Lock 0 No Gait/Training Normal/bed rest/immobile 0 No Weak 10 Yes Impaired 20 Yes Mental Status Oriented to own ability 0 Yes Electronic Signature(s) Signed: 12/12/2017 5:07:08 PM By: Curtis Sitesorthy, Joanna Entered By: Curtis Sitesorthy, Joanna on 12/12/2017 13:02:33 Daniel Mitchell, Daniel B. ([id number]) -------------------------------------------------------------------------------- Foot Assessment Details Patient Name: Daniel Mitchell, Daniel B. Date of Service: 12/12/2017 12:30 PM Medical Record Number: [id number] Patient Account Number: [id number] Date of Birth/Sex: 05/15/1965 (52 y.o. M) Treating RN: Curtis Sitesorthy, Joanna Primary Care Manjot Hinks: Einar CrowAnderson, Marshall Other Clinician: Referring Vada Yellen: Einar CrowAnderson, Marshall Treating Nicolus Ose/Extender: Linwood DibblesSTONE III, HOYT Weeks in Treatment: 0 Foot Assessment Items Site Locations + = Sensation present, - = Sensation absent, C = Callus, U = Ulcer R = Redness, W = Warmth, M = Maceration, PU = Pre-ulcerative lesion F = Fissure, S = Swelling, D = Dryness Assessment Right: Left: Other Deformity: No No Prior Foot Ulcer:  No No Prior Amputation: No No Charcot Joint: No No Ambulatory Status: Non-ambulatory Assistance Device: Wheelchair Gait: Steady Electronic Signature(s) Signed: 12/12/2017 5:07:08 PM By: Curtis Sitesorthy, Joanna Entered By: Curtis Sitesorthy, Joanna on 12/12/2017 13:04:51 Daniel Mitchell, Daniel B. ([id number]) -------------------------------------------------------------------------------- Nutrition Risk Assessment Details Patient Name: Daniel Mitchell, Daniel B. Date of Service: 12/12/2017 12:30 PM Medical Record Number: [id number] Patient Account Number: [id number] Date of Birth/Sex: 08/25/1965 (52 y.o. M) Treating RN: Curtis Sitesorthy, Joanna Primary Care Amberlyn Martinezgarcia: Einar CrowAnderson, Marshall Other Clinician: Referring Rosabel Sermeno: Einar CrowAnderson, Marshall Treating Arjan Strohm/Extender: Linwood DibblesSTONE III, HOYT Weeks in Treatment: 0 Height (in): Weight (lbs): Body Mass Index (BMI): Nutrition Risk Assessment Items NUTRITION RISK SCREEN: I have an illness or condition that made me change the kind and/or amount of 0 No food I eat I eat fewer than two meals per day 0 No I eat few fruits and vegetables, or milk products 0 No I have three or more drinks of beer, liquor or wine almost every day 0 No I have tooth or mouth problems that make it hard for me to eat 2 Yes I don't always have enough money to buy the food I need 0 No I eat alone most of the time 0 No I take three or more different prescribed or over-the-counter drugs a day 1 Yes Without wanting to, I have lost or gained 10 pounds in the last six months 0 No I am not always physically able to shop, cook and/or feed myself 2 Yes Nutrition Protocols Good Risk Protocol Provide education on Moderate Risk Protocol 0 nutrition Electronic Signature(s) Signed: 12/12/2017 5:07:08 PM By: Curtis Sitesorthy, Joanna Entered By: Curtis Sitesorthy, Joanna on 12/12/2017 13:03:10

## 2018-02-16 ENCOUNTER — Emergency Department: Payer: Medicare Other

## 2018-02-16 ENCOUNTER — Other Ambulatory Visit: Payer: Self-pay

## 2018-02-16 ENCOUNTER — Emergency Department
Admission: EM | Admit: 2018-02-16 | Discharge: 2018-02-16 | Disposition: A | Discharge status: Home / Self Care | Payer: Medicare Other | Attending: Emergency Medicine | Admitting: Emergency Medicine

## 2018-02-16 ENCOUNTER — Encounter: Payer: Self-pay | Admitting: Emergency Medicine

## 2018-02-16 DIAGNOSIS — S40212A Abrasion of left shoulder, initial encounter: Secondary | ICD-10-CM | POA: Diagnosis not present

## 2018-02-16 DIAGNOSIS — Y998 Other external cause status: Secondary | ICD-10-CM | POA: Insufficient documentation

## 2018-02-16 DIAGNOSIS — Z79899 Other long term (current) drug therapy: Secondary | ICD-10-CM | POA: Diagnosis not present

## 2018-02-16 DIAGNOSIS — S20212A Contusion of left front wall of thorax, initial encounter: Secondary | ICD-10-CM | POA: Insufficient documentation

## 2018-02-16 DIAGNOSIS — E119 Type 2 diabetes mellitus without complications: Secondary | ICD-10-CM | POA: Diagnosis not present

## 2018-02-16 DIAGNOSIS — Y93F9 Activity, other caregiving: Secondary | ICD-10-CM | POA: Insufficient documentation

## 2018-02-16 DIAGNOSIS — S3991XA Unspecified injury of abdomen, initial encounter: Secondary | ICD-10-CM | POA: Diagnosis not present

## 2018-02-16 DIAGNOSIS — Z7984 Long term (current) use of oral hypoglycemic drugs: Secondary | ICD-10-CM | POA: Diagnosis not present

## 2018-02-16 DIAGNOSIS — W050XXA Fall from non-moving wheelchair, initial encounter: Secondary | ICD-10-CM | POA: Diagnosis not present

## 2018-02-16 DIAGNOSIS — I10 Essential (primary) hypertension: Secondary | ICD-10-CM | POA: Insufficient documentation

## 2018-02-16 DIAGNOSIS — W19XXXA Unspecified fall, initial encounter: Secondary | ICD-10-CM

## 2018-02-16 DIAGNOSIS — S0990XA Unspecified injury of head, initial encounter: Secondary | ICD-10-CM | POA: Diagnosis present

## 2018-02-16 DIAGNOSIS — T1490XA Injury, unspecified, initial encounter: Secondary | ICD-10-CM

## 2018-02-16 DIAGNOSIS — S0001XA Abrasion of scalp, initial encounter: Secondary | ICD-10-CM | POA: Insufficient documentation

## 2018-02-16 DIAGNOSIS — Y92818 Other transport vehicle as the place of occurrence of the external cause: Secondary | ICD-10-CM | POA: Insufficient documentation

## 2018-02-16 LAB — CBC WITH DIFFERENTIAL/PLATELET
ABS IMMATURE GRANULOCYTES: 0.04 10*3/uL (ref 0.00–0.07)
BASOS ABS: 0 10*3/uL (ref 0.0–0.1)
Basophils Relative: 0 %
EOS ABS: 0 10*3/uL (ref 0.0–0.5)
Eosinophils Relative: 1 %
HEMATOCRIT: 41 % (ref 39.0–52.0)
Hemoglobin: 13.4 g/dL (ref 13.0–17.0)
IMMATURE GRANULOCYTES: 1 %
LYMPHS ABS: 1.5 10*3/uL (ref 0.7–4.0)
LYMPHS PCT: 27 %
MCH: 31.5 pg (ref 26.0–34.0)
MCHC: 32.7 g/dL (ref 30.0–36.0)
MCV: 96.2 fL (ref 80.0–100.0)
Monocytes Absolute: 0.7 10*3/uL (ref 0.1–1.0)
Monocytes Relative: 13 %
NEUTROS ABS: 3.1 10*3/uL (ref 1.7–7.7)
NEUTROS PCT: 58 %
NRBC: 0 % (ref 0.0–0.2)
Platelets: 104 10*3/uL — ABNORMAL LOW (ref 150–400)
RBC: 4.26 MIL/uL (ref 4.22–5.81)
RDW: 13.6 % (ref 11.5–15.5)
WBC: 5.4 10*3/uL (ref 4.0–10.5)

## 2018-02-16 LAB — COMPREHENSIVE METABOLIC PANEL
ALT: 16 U/L (ref 0–44)
ANION GAP: 9 (ref 5–15)
AST: 21 U/L (ref 15–41)
Albumin: 3.2 g/dL — ABNORMAL LOW (ref 3.5–5.0)
Alkaline Phosphatase: 46 U/L (ref 38–126)
BUN: 6 mg/dL (ref 6–20)
CHLORIDE: 100 mmol/L (ref 98–111)
CO2: 30 mmol/L (ref 22–32)
Calcium: 9.3 mg/dL (ref 8.9–10.3)
Creatinine, Ser: 0.56 mg/dL — ABNORMAL LOW (ref 0.61–1.24)
GFR calc non Af Amer: >60 mL/min (ref >60)
Glucose, Bld: 159 mg/dL — ABNORMAL HIGH (ref 70–99)
POTASSIUM: 3.9 mmol/L (ref 3.5–5.1)
Sodium: 139 mmol/L (ref 135–145)
Total Bilirubin: 0.5 mg/dL (ref 0.3–1.2)
Total Protein: 6.8 g/dL (ref 6.5–8.1)

## 2018-02-16 MED ORDER — IOPAMIDOL (ISOVUE-300) INJECTION 61%
100.0000 mL | Freq: Once | INTRAVENOUS | Status: AC | PRN
  Administered 2018-02-16: 100 mL via INTRAVENOUS
  Filled 2018-02-16: qty 100

## 2018-02-16 NOTE — ED Notes (Signed)
Labs sent at this time.

## 2018-02-16 NOTE — ED Notes (Signed)
Pt presents today after falling in wheelchair off lift. Pt is complaining of pain in the R ribs, R leg. Pt is NAD and family at bedside.
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
## 2018-02-16 NOTE — ED Triage Notes (Signed)
Arrives from [facility] for ED evaluation of mechanical fall.  Patient fell this morning from the [place] to the ground.  Patient hit head on the [facility] lift.  No LOC

## 2018-02-16 NOTE — Discharge Instructions (Addendum)
Tylenol as needed for pain.  Apply ice to all areas that may hurt.  Return to the emergency department if worsening.

## 2018-04-23 DIAGNOSIS — R600 Localized edema: Secondary | ICD-10-CM | POA: Diagnosis present

## 2018-05-01 ENCOUNTER — Encounter (HOSPITAL_COMMUNITY): Payer: Self-pay | Admitting: Dentistry

## 2018-05-15 ENCOUNTER — Encounter (HOSPITAL_COMMUNITY): Payer: Self-pay | Admitting: Dentistry

## 2018-06-04 ENCOUNTER — Ambulatory Visit (HOSPITAL_COMMUNITY): Payer: Medicaid - Dental | Admitting: Dentistry

## 2018-06-04 ENCOUNTER — Encounter (HOSPITAL_COMMUNITY): Payer: Self-pay | Admitting: Dentistry

## 2018-06-04 VITALS — BP 109/81 | HR 87 | Temp 98.1°F

## 2018-06-04 DIAGNOSIS — M26601 Right temporomandibular joint disorder, unspecified: Secondary | ICD-10-CM | POA: Diagnosis not present

## 2018-06-04 DIAGNOSIS — K036 Deposits [accretions] on teeth: Secondary | ICD-10-CM

## 2018-06-04 DIAGNOSIS — K0820 Unspecified atrophy of edentulous alveolar ridge: Secondary | ICD-10-CM

## 2018-06-04 DIAGNOSIS — Z972 Presence of dental prosthetic device (complete) (partial): Secondary | ICD-10-CM

## 2018-06-04 DIAGNOSIS — K08409 Partial loss of teeth, unspecified cause, unspecified class: Secondary | ICD-10-CM

## 2018-06-04 DIAGNOSIS — F89 Unspecified disorder of psychological development: Secondary | ICD-10-CM

## 2018-06-04 DIAGNOSIS — K08109 Complete loss of teeth, unspecified cause, unspecified class: Secondary | ICD-10-CM

## 2018-06-04 DIAGNOSIS — K0530 Chronic periodontitis, unspecified: Secondary | ICD-10-CM

## 2018-06-04 DIAGNOSIS — M264 Malocclusion, unspecified: Secondary | ICD-10-CM

## 2018-06-04 NOTE — Progress Notes (Signed)
06/04/2018  Patient:            Daniel Mitchell Date of Birth:  24-Jul-1965 MRN:                [id number]  Blood pressure 109/81, pulse 87, temperature 98.1 F (36.7 C).  Daniel Mitchell is a 53 year old male that presents for periodic oral exam, dental cleaning, and adjustment of upper complete and lower partial denture as indicated. Patient, mother, and caregivers deny any acute medical or dental problems. Patient recently was evaluated for leg swelling.Patient was prescribed Torsemdie for use on a Monday Wednesday and Friday.  There were No other acute problems by patient caregiver or mother's report.  Premedication: None required  Patient Active Problem List   Diagnosis Date Noted  . History of external ear infection 02/09/2016  . 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 Ataxia    Demyelinating motor neuropathy  . Chronic UTI (urinary tract infection)   . Depression    History of depression  . Developmental disability   . Diabetes mellitus type 2, controlled (HCC)   . Hypertension   . 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 Seasonal allergies   . Seizure disorder [facility])    Mother states patient has never had a seizure.  Marland Kitchen Urethral foreign body 08/05/14   Dr. Lonna Cobb  . Urogenital disorder    Multiple surgeries; H/O Undescended testicle; nocturnal enuresis   Past Surgical History:  Procedure Laterality Date  . CYSTOSCOPY  04/09/14   Status post cystoscopy and removal of calculus  . CYSTOSCOPY  09/03/14   Status post cystoscopy with removal of calculus  . INGUINAL HERNIA REPAIR      ALLERGIES/ADVERSE DRUG REACTIONS: Allergies  Allergen Reactions  . Topamax [Topiramate]   . Zithromax [Azithromycin]     MEDICATIONS: Current Outpatient Medications  Medication Sig Dispense Refill  . acetaminophen (TYLENOL) 500 MG tablet Take 500 mg by mouth every 4 (four) hours as needed.    Marland Kitchen azelastine (ASTELIN) 0.1 % nasal  spray Place 2 sprays into both nostrils 2 (two) times daily. Use in each nostril as directed    . baclofen (LIORESAL) 10 MG tablet Take 10 mg by mouth 3 (three) times daily.    . cetirizine (ZYRTEC) 10 MG tablet Take 10 mg by mouth daily.    . diclofenac sodium (VOLTAREN) 1 % GEL Apply 2 g topically 4 (four) times daily.    . divalproex (DEPAKOTE) 500 MG DR tablet Take 1,000 mg by mouth 2 (two) times daily.    Marland Kitchen docusate sodium (COLACE) 100 MG capsule Take 100 mg by mouth 2 (two) times daily as needed.     . ferrous sulfate 325 (65 FE) MG tablet Take 325 mg by mouth daily.    Marland Kitchen ibuprofen (ADVIL,MOTRIN) 800 MG tablet Take 800 mg by mouth every 8 (eight) hours as needed.    . loratadine (CLARITIN) 10 MG tablet Take 10 mg by mouth daily.    . metFORMIN (GLUCOPHAGE) 500 MG tablet Take 500 mg by mouth 2 (two) times daily with a meal.    . nystatin (MYCOSTATIN) powder Apply topically 2 (two) times daily.    . Olopatadine HCl (PATANASE) 0.6 % SOLN Place into the nose. Nasal spray: Used 2 sprays in each nostril twice daily as needed for nasal congestion.    . paliperidone (INVEGA) 3 MG 24 hr tablet Take 3 mg by mouth every morning.    Marland Kitchen  pravastatin (PRAVACHOL) 20 MG tablet Take 20 mg by mouth daily.    . risperiDONE (RISPERDAL M-TABS) 1 MG disintegrating tablet Take 2 mg by mouth. Dissolve 1 tablet under tongue as needed for agitation. May repeat in 20 minutes.    . sertraline (ZOLOFT) 50 MG tablet Take 50 mg by mouth daily.    Marland Kitchen. torsemide (DEMADEX) 5 MG tablet Take 5 mg by mouth every Monday, Wednesday, and Friday.     No current facility-administered medications for this visit.     C/C: Patient presents with his mother for periodic oral examination, dental cleaning, and evaluation of upper complete and lower acrylic flexible partial denture.  HPI:  Patient was last seen for exam and cleaning and adjustment of an upper complete and lower partial denture on October 17, 2017. Patient is not complaining  of any dental pain by report of mother. Medical changes as above.  DENTAL EXAM: General: Patient is a developmentally disabled male in no acute distress. The patient was transferred to the dental chair from a wheelchair with aid of caregiver.  Extraoral Exam: There is no lymphadenopathy noted. No tenderness to palpation. There are no TMJ symptoms. Intraoral  Exam: Patient has excessive saliva. There is minimal denture adhesive that is adherent to the maxillary tissues. There is no evidence of any palatal irritation or soft tissue lesion. 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 There is gingival recession associated with lower right and left molars. Tooth #19 now has Class I tooth mobility.   Dentition: Patient with tooth numbers 18, 19, and 31 remaining. All other teeth are missing. Periodontal: Patient has chronic periodontitis with very minimal plaque accumulations, generalized gingival recession, and Class I mobility of tooth #'s 19. No sensitivity reported today.  Oral hygiene is Very Good.  Adult prophylaxis provided with an ultrasonic scaler and selective hand curettes. Teeth were polished. Caries/Restorations: No caries noted. Endodontic: No acute pulpitis symptoms.  C&B: Patient has Full Gold crown the tooth number 19.  Prosthodontic: Patient has an upper complete denture and a lower Flexite mandibular partial denture.  DENTURES ARE CLEANED ACCEPTABLY. Patient ideally could benefit from oral maxillofacial prosthodontic evaluation for possible implant therapy and fabrication of an implant retained lower partial denture or complete denture if remaining teeth are removed. Mother does not wish to proceed with referral at this time.  Patient is using Fixodent Ultrahold denture adhesive to assist in retention of the upper complete denture. Occlusion: The occlusion of the dentures is less than ideal but appears to be acceptable at this time. Bilateral mandibular posterior crossbite  is noted as before.  Assessments: 1. Chronic periodontitis 2. Accretions 3. Generalized gingival recession 4. Tooth mobility #19 5. Multiple missing teeth 6. Significant Atrophy of edentulous alveolar ridges 7. Less than ideal upper complete and lower Flexite partial denture.  Procedures: 1. Periodic oral exam 2. Adult prophylaxis with ultrasonic scaler and selective hand curettes. [place].   Plan:  1. Patient and caregivers are to continue to brush teeth at least twice daily with fluoride toothpaste.  2. Patient to use Fixodent ultrahold denture adhesive for use with the upper denture. 3. Return to clinic for exam and cleaning in 6 months as scheduled for September 15th , 2020 at 11:00 am.  4. Caregiver or Mother to call if problems arise before the next scheduled appointment.   Charlynne Panderonald F. , DDS

## 2018-06-04 NOTE — Patient Instructions (Signed)
Plan:  1. Patient and caregivers are to continue to brush teeth at least twice daily with fluoride toothpaste.  2. Patient to use Fixodent ultrahold denture adhesive for use with the upper denture. 3. Return to clinic for exam and cleaning in 6 months as scheduled for September 15th , 2020 at 11:00 am.  4. Caregiver or Mother to call if problems arise before the next scheduled appointment.   Charlynne Pander, DDS

## 2019-01-08 ENCOUNTER — Encounter (HOSPITAL_COMMUNITY): Payer: Self-pay | Admitting: Dentistry

## 2019-01-28 ENCOUNTER — Other Ambulatory Visit: Payer: Self-pay

## 2019-01-28 ENCOUNTER — Ambulatory Visit (HOSPITAL_COMMUNITY): Payer: Medicaid - Dental | Admitting: Dentistry

## 2019-01-28 ENCOUNTER — Encounter (HOSPITAL_COMMUNITY): Payer: Self-pay | Admitting: Dentistry

## 2019-01-28 VITALS — BP 130/74 | HR 97 | Temp 99.3°F

## 2019-01-28 DIAGNOSIS — K06010 Localized gingival recession, unspecified: Secondary | ICD-10-CM

## 2019-01-28 DIAGNOSIS — K0530 Chronic periodontitis, unspecified: Secondary | ICD-10-CM

## 2019-01-28 DIAGNOSIS — Z972 Presence of dental prosthetic device (complete) (partial): Secondary | ICD-10-CM

## 2019-01-28 DIAGNOSIS — K036 Deposits [accretions] on teeth: Secondary | ICD-10-CM

## 2019-01-28 DIAGNOSIS — K0820 Unspecified atrophy of edentulous alveolar ridge: Secondary | ICD-10-CM

## 2019-01-28 DIAGNOSIS — K08109 Complete loss of teeth, unspecified cause, unspecified class: Secondary | ICD-10-CM

## 2019-01-28 DIAGNOSIS — K08409 Partial loss of teeth, unspecified cause, unspecified class: Secondary | ICD-10-CM

## 2019-01-28 DIAGNOSIS — F89 Unspecified disorder of psychological development: Secondary | ICD-10-CM

## 2019-01-28 DIAGNOSIS — M264 Malocclusion, unspecified: Secondary | ICD-10-CM

## 2019-01-28 NOTE — Progress Notes (Signed)
01/28/2019  Patient:            Daniel Mitchell Date of Birth:  1966-02-26 MRN:                [id number]  Blood pressure 130/74, pulse 97, temperature 99.3 F (37.4 C).  COVID 19 SCREENING: The patient does not symptoms concerning for COVID-19 infection (Including fever, chills, cough, or new SHORTNESS OF BREATH).   Daniel Mitchell is a 53 year old male that presents for periodic oral exam, dental cleaning, and adjustment of upper complete and lower partial denture as indicated. Patient and caregiver deny any acute medical or dental problems. 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  Patient presents with caregiver only secondary to COVID-19 restrictions.  There were No other acute problems by patient caregiver report.  Premedication: None required  Patient Active Problem List   Diagnosis Date Noted  . History of external ear infection 02/09/2016  . 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 Ataxia    Demyelinating motor neuropathy  . Chronic UTI (urinary tract infection)   . Depression    History of depression  . Developmental disability   . Diabetes mellitus type 2, controlled (HCC)   . Hypertension   . 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 Seasonal allergies   . Seizure disorder [facility])    Mother states patient has never had a seizure.  Marland Kitchen Urethral foreign body 08/05/14   Dr. Lonna Cobb  . Urogenital disorder    Multiple surgeries; H/O Undescended testicle; nocturnal enuresis   Past Surgical History:  Procedure Laterality Date  . CYSTOSCOPY  04/09/14   Status post cystoscopy and removal of calculus  . CYSTOSCOPY  09/03/14   Status post cystoscopy with removal of calculus  . INGUINAL HERNIA REPAIR      ALLERGIES/ADVERSE DRUG REACTIONS: Allergies  Allergen Reactions  . Topamax [Topiramate]   . Zithromax [Azithromycin]     MEDICATIONS: Current Outpatient Medications  Medication  Sig Dispense Refill  . acetaminophen (TYLENOL) 500 MG tablet Take 500 mg by mouth every 4 (four) hours as needed.    Marland Kitchen azelastine (ASTELIN) 0.1 % nasal spray Place 2 sprays into both nostrils 2 (two) times daily. Use in each nostril as directed    . baclofen (LIORESAL) 10 MG tablet Take 10 mg by mouth 3 (three) times daily.    . cetirizine (ZYRTEC) 10 MG tablet Take 10 mg by mouth daily.    . diclofenac sodium (VOLTAREN) 1 % GEL Apply 2 g topically 4 (four) times daily.    . divalproex (DEPAKOTE) 500 MG DR tablet Take 1,000 mg by mouth 2 (two) times daily.    Marland Kitchen docusate sodium (COLACE) 100 MG capsule Take 100 mg by mouth 2 (two) times daily as needed.     . ferrous sulfate 325 (65 FE) MG tablet Take 325 mg by mouth daily.    Marland Kitchen ibuprofen (ADVIL,MOTRIN) 800 MG tablet Take 800 mg by mouth every 8 (eight) hours as needed.    . loratadine (CLARITIN) 10 MG tablet Take 10 mg by mouth daily.    . metFORMIN (GLUCOPHAGE) 500 MG tablet Take 500 mg by mouth 2 (two) times daily with a meal.    . nystatin (MYCOSTATIN) powder Apply topically 2 (two) times daily.    . Olopatadine HCl (PATANASE) 0.6 % SOLN Place into the nose. Nasal spray: Used 2 sprays in each nostril twice daily as  needed for nasal congestion.    . paliperidone (INVEGA) 3 MG 24 hr tablet Take 3 mg by mouth every morning.    . pravastatin (PRAVACHOL) 20 MG tablet Take 20 mg by mouth daily.    . risperiDONE (RISPERDAL M-TABS) 1 MG disintegrating tablet Take 2 mg by mouth. Dissolve 1 tablet under tongue as needed for agitation. May repeat in 20 minutes.    . sertraline (ZOLOFT) 50 MG tablet Take 50 mg by mouth daily.    Marland Kitchen torsemide (DEMADEX) 5 MG tablet Take 5 mg by mouth every Monday, Wednesday, and Friday.     No current facility-administered medications for this visit.     C/C: Patient presents with his caregiver for periodic oral examination, dental cleaning, and evaluation of upper complete and lower acrylic flexible partial  denture.  HPI:  Patient was last seen for exam and cleaning and adjustment of an upper complete and lower partial denture on 06/04/2018. Patient is not complaining of any dental pain by report of caregiver. Medical changes as above.  DENTAL EXAM: General: Patient is a developmentally disabled male in no acute distress. The patient was transferred to the dental chair from a wheelchair with aid of caregiver.  Extraoral Exam: There is no lymphadenopathy noted. No tenderness to palpation. There are no TMJ symptoms. Intraoral  Exam: Patient has excessive saliva. There is minimal denture adhesive that is adherent to the maxillary tissues. There is no evidence of any palatal irritation or soft tissue lesion. 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 There is gingival recession associated with lower right and left molars. Tooth #19 now has Class I tooth mobility.   Dentition: Patient with tooth numbers 18, 19, and 31 remaining. All other teeth are missing. Periodontal: Patient has chronic periodontitis with very minimal plaque accumulations, generalized gingival recession, and Class I mobility of tooth #'s 19. No sensitivity reported today.  Oral hygiene is Very Good.  Adult prophylaxis provided with a KaVo sonic scaler and selective hand curettes. Teeth were polished.  Periodontal: Chronic periodontitis with minimal plaque accumulations, generalized gingival recession, and incipient tooth mobility #19. Caries/Restorations: No caries noted. Endodontic: No acute pulpitis symptoms.  C&B: Patient has Full Gold crown the tooth number 19.  Prosthodontic: Patient has an upper complete denture and a lower Flexite mandibular partial denture.  DENTURES ARE CLEANED ACCEPTABLY. Patient ideally could benefit from oral maxillofacial prosthodontic evaluation for possible implant therapy and fabrication of an implant retained lower partial denture or complete denture if remaining teeth are removed.  Mother has refused to proceed with referral previously.  Patient is using Fixodent Ultrahold denture adhesive to assist in retention of the upper complete denture. Occlusion: The occlusion of the dentures is less than ideal but appears to be acceptable at this time. Bilateral mandibular posterior crossbite is noted as before.  Assessments: 1. Chronic periodontitis 2. Accretions 3. Generalized gingival recession 4. Tooth mobility #19 5. Multiple missing teeth 6. Significant Atrophy of edentulous alveolar ridges 7. Less than ideal upper complete and lower Flexite partial denture.  Procedures: 1. Periodic oral exam 2. Adult prophylaxis with KaVo sonic scaler and selective hand curettes. [place]   Plan:  1. Patient and caregivers are to continue to brush teeth at least twice daily with fluoride toothpaste.  2. Patient to use Fixodent ultrahold denture adhesive for use with the upper denture. 3. Return to clinic for exam and cleaning in 6 months as scheduled for July 30, 2019 at 11 AM. 4. Caregiver or  Mother to call if problems arise before the next scheduled appointment.   Charlynne Panderonald F. , DDS

## 2019-01-28 NOTE — Patient Instructions (Signed)
Plan:  1. Patient and caregivers are to continue to brush teeth at least twice daily with fluoride toothpaste.  2. Patient to use Fixodent ultrahold denture adhesive for use with the upper denture. 3. Return to clinic for exam and cleaning in 6 months as scheduled for July 30, 2019 at 69 AM. 4. Caregiver or Mother to call if problems arise before the next scheduled appointment.   Lenn Cal, DDS

## 2019-07-30 ENCOUNTER — Encounter (HOSPITAL_COMMUNITY): Payer: Medicare Other | Admitting: Dentistry

## 2019-08-12 ENCOUNTER — Ambulatory Visit (HOSPITAL_COMMUNITY): Payer: Medicaid Other | Admitting: Dentistry

## 2019-08-12 ENCOUNTER — Other Ambulatory Visit: Payer: Self-pay

## 2019-08-12 ENCOUNTER — Encounter (HOSPITAL_COMMUNITY): Payer: Self-pay | Admitting: Dentistry

## 2019-08-12 VITALS — BP 116/71 | HR 76 | Temp 98.3°F

## 2019-08-12 DIAGNOSIS — K06010 Localized gingival recession, unspecified: Secondary | ICD-10-CM

## 2019-08-12 DIAGNOSIS — K08409 Partial loss of teeth, unspecified cause, unspecified class: Secondary | ICD-10-CM

## 2019-08-12 DIAGNOSIS — K036 Deposits [accretions] on teeth: Secondary | ICD-10-CM

## 2019-08-12 DIAGNOSIS — K08109 Complete loss of teeth, unspecified cause, unspecified class: Secondary | ICD-10-CM

## 2019-08-12 DIAGNOSIS — K0820 Unspecified atrophy of edentulous alveolar ridge: Secondary | ICD-10-CM

## 2019-08-12 DIAGNOSIS — K0530 Chronic periodontitis, unspecified: Secondary | ICD-10-CM

## 2019-08-12 DIAGNOSIS — Z972 Presence of dental prosthetic device (complete) (partial): Secondary | ICD-10-CM

## 2019-08-12 DIAGNOSIS — M264 Malocclusion, unspecified: Secondary | ICD-10-CM

## 2019-08-12 DIAGNOSIS — F89 Unspecified disorder of psychological development: Secondary | ICD-10-CM

## 2019-08-12 DIAGNOSIS — Z98811 Dental restoration status: Secondary | ICD-10-CM | POA: Diagnosis not present

## 2019-08-12 DIAGNOSIS — K0889 Other specified disorders of teeth and supporting structures: Secondary | ICD-10-CM

## 2019-08-12 NOTE — Progress Notes (Signed)
08/12/2019  Patient:            Daniel Mitchell Date of Birth:  1965-08-09 MRN:                [id number]  Blood pressure 116/71, pulse 76, temperature 98.3 F (36.8 C).  COVID 19 SCREENING: The patient does not symptoms concerning for COVID-19 infection (Including fever, chills, cough, or new SHORTNESS OF BREATH).   Daniel Mitchell is a 54 year old male that presents for periodic oral exam, dental cleaning, and adjustment of upper complete and lower partial denture as indicated. Patient and caregiver deny any acute medical or dental problems. 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  Patient presents with caregiver only secondary to COVID-19 restrictions.  Patient has had COVID-19 vaccine.  There were No other acute problems by patient caregiver report.  Premedication: None required  Patient Active Problem List   Diagnosis Date Noted  . History of external ear infection 02/09/2016  . 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 Ataxia    Demyelinating motor neuropathy  . Chronic UTI (urinary tract infection)   . Depression    History of depression  . Developmental disability   . Diabetes mellitus type 2, controlled (HCC)   . Hypertension   . 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 Seasonal allergies   . Seizure disorder [facility])    Mother states patient has never had a seizure.  Marland Kitchen Urethral foreign body 08/05/14   Dr. Lonna Cobb  . Urogenital disorder    Multiple surgeries; H/O Undescended testicle; nocturnal enuresis   Past Surgical History:  Procedure Laterality Date  . CYSTOSCOPY  04/09/14   Status post cystoscopy and removal of calculus  . CYSTOSCOPY  09/03/14   Status post cystoscopy with removal of calculus  . INGUINAL HERNIA REPAIR      ALLERGIES/ADVERSE DRUG REACTIONS: Allergies  Allergen Reactions  . Topamax [Topiramate]   . Zithromax [Azithromycin]     MEDICATIONS: Current  Outpatient Medications  Medication Sig Dispense Refill  . acetaminophen (TYLENOL) 500 MG tablet Take 500 mg by mouth every 4 (four) hours as needed.    Marland Kitchen azelastine (ASTELIN) 0.1 % nasal spray Place 2 sprays into both nostrils 2 (two) times daily. Use in each nostril as directed    . baclofen (LIORESAL) 10 MG tablet Take 10 mg by mouth 3 (three) times daily.    . cetirizine (ZYRTEC) 10 MG tablet Take 10 mg by mouth daily.    . Cholecalciferol (VITAMIN D) 50 MCG (2000 UT) tablet Take 2,000 Units by mouth daily.    . diclofenac sodium (VOLTAREN) 1 % GEL Apply 2 g topically 4 (four) times daily.    . divalproex (DEPAKOTE) 500 MG DR tablet Take 1,000 mg by mouth 2 (two) times daily.    Marland Kitchen docusate sodium (COLACE) 100 MG capsule Take 100 mg by mouth 2 (two) times daily as needed.     . ferrous sulfate 325 (65 FE) MG tablet Take 325 mg by mouth daily.    Marland Kitchen loratadine (CLARITIN) 10 MG tablet Take 10 mg by mouth daily.    . metFORMIN (GLUCOPHAGE) 500 MG tablet Take 500 mg by mouth 2 (two) times daily with a meal.    . nystatin (MYCOSTATIN) powder Apply topically 2 (two) times daily.    . Olopatadine HCl (PATANASE) 0.6 % SOLN Place into the nose. Nasal spray: Used 2 sprays in each  nostril twice daily as needed for nasal congestion.    . paliperidone (INVEGA) 3 MG 24 hr tablet Take 3 mg by mouth every morning.    . pravastatin (PRAVACHOL) 20 MG tablet Take 20 mg by mouth daily.    . risperiDONE (RISPERDAL M-TABS) 1 MG disintegrating tablet Take 2 mg by mouth. Dissolve 1 tablet under tongue as needed for agitation. May repeat in 20 minutes.    . sertraline (ZOLOFT) 50 MG tablet Take 50 mg by mouth daily.    Marland Kitchen terbinafine (LAMISIL) 1 % cream Apply 1 application topically daily.    Marland Kitchen torsemide (DEMADEX) 5 MG tablet Take 5 mg by mouth every Monday, Wednesday, and Friday.     No current facility-administered medications for this visit.    C/C: Patient presents with his caregiver for periodic oral  examination, dental cleaning, and evaluation of upper complete and lower acrylic flexible partial denture.  HPI:  Patient was last seen for exam and cleaning and adjustment of an upper complete and lower partial denture on 01/28/2019. Patient is not complaining of any dental pain by report of caregiver. Medical changes as above.  DENTAL EXAM: General: Patient is a developmentally disabled male in no acute distress. The patient was transferred to the dental chair from a wheelchair with aid of caregiver.  Extraoral Exam: There is no lymphadenopathy noted. No tenderness to palpation. There are no TMJ symptoms. Intraoral  Exam: Patient has excessive saliva. There is minimal denture adhesive that is adherent to the maxillary tissues. There is no evidence of any palatal irritation or soft tissue lesion. 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 There is gingival recession associated with lower right and left molars. Tooth #19 now has Class I tooth mobility.   Dentition: Patient with tooth numbers 18, 19, and 31 remaining. All other teeth are missing. Periodontal: Patient has chronic periodontitis with very minimal plaque accumulations, generalized gingival recession, and Class I mobility of tooth #'s 19. No sensitivity reported today.  Oral hygiene is Very Good.  Adult prophylaxis provided with a KaVo sonic scaler and selective hand curettes. Teeth were polished.  Periodontal: Chronic periodontitis with minimal plaque accumulations, generalized gingival recession, and incipient tooth mobility #19. Caries/Restorations: No caries noted. Endodontic: No acute pulpitis symptoms.  C&B: Patient has Full Gold crown the tooth number 19.  Prosthodontic: Patient has an upper complete denture and a lower Flexite mandibular partial denture.  Patient does not wear his upper complete and lower flex type partial denture all the time.  Patient wears it when he wants to. DENTURES ARE CLEANED  ACCEPTABLY. Patient ideally could benefit from oral maxillofacial prosthodontic evaluation for possible implant therapy and fabrication of an implant retained lower partial denture or complete denture if remaining teeth are removed. Mother has refused to proceed with referral previously.  Patient is using Fixodent Ultrahold denture adhesive to assist in retention of the upper complete denture. Occlusion: The occlusion of the dentures is less than ideal but appears to be acceptable at this time. Bilateral mandibular posterior crossbite is noted as before.  Assessments: 1. Chronic periodontitis 2. Accretions 3. Generalized gingival recession 4. Tooth mobility #19 5. Multiple missing teeth 6. Significant Atrophy of edentulous alveolar ridges 7. Less than ideal upper complete and lower Flexite partial denture.  Procedures: 1. Periodic oral exam 2. Adult prophylaxis with KaVo sonic scaler and selective hand curettes. [place].  3. Application of ClearShield/fluoride varnish.  Plan:  1. Patient and caregivers are to continue to brush  teeth at least twice daily with fluoride toothpaste.  2. Patient to use Fixodent ultrahold denture adhesive for use with the upper denture. 3. Return to clinic for exam and cleaning in 6 months as scheduled for February 25, 2020 at 11 AM. Caregiver is aware that I may be leaving soon and that patient may need to F/U with local DDS or DDS at [facility] for continued dental care. 4. Caregiver or Mother to call if problems arise before the next scheduled appointment.   Lenn Cal, DDS

## 2019-08-12 NOTE — Patient Instructions (Signed)
Plan:  1. Patient and caregivers are to continue to brush teeth at least twice daily with fluoride toothpaste.  2. Patient to use Fixodent ultrahold denture adhesive for use with the upper denture. 3. Return to clinic for exam and cleaning in 6 months as scheduled for February 25, 2020 at 11 AM. Caregiver is aware that I may be leaving soon and that patient may need to F/U with local DDS or DDS at [facility] for continued dental care. 4. Caregiver or Mother to call if problems arise before the next scheduled appointment.   Charlynne Pander, DDS

## 2021-07-08 ENCOUNTER — Other Ambulatory Visit: Payer: Self-pay

## 2021-07-08 ENCOUNTER — Emergency Department
Admission: EM | Admit: 2021-07-08 | Discharge: 2021-07-09 | Disposition: A | Discharge status: Home / Self Care | Payer: Medicare Other | Attending: Emergency Medicine | Admitting: Emergency Medicine

## 2021-07-08 ENCOUNTER — Emergency Department: Payer: Medicare Other

## 2021-07-08 DIAGNOSIS — E119 Type 2 diabetes mellitus without complications: Secondary | ICD-10-CM | POA: Diagnosis not present

## 2021-07-08 DIAGNOSIS — M7989 Other specified soft tissue disorders: Secondary | ICD-10-CM | POA: Insufficient documentation

## 2021-07-08 DIAGNOSIS — M79605 Pain in left leg: Secondary | ICD-10-CM | POA: Insufficient documentation

## 2021-07-08 DIAGNOSIS — R600 Localized edema: Secondary | ICD-10-CM | POA: Insufficient documentation

## 2021-07-08 LAB — CBC WITH DIFFERENTIAL/PLATELET
Abs Immature Granulocytes: 0.06 10*3/uL (ref 0.00–0.07)
Basophils Absolute: 0 10*3/uL (ref 0.0–0.1)
Basophils Relative: 1 %
Eosinophils Absolute: 0.2 10*3/uL (ref 0.0–0.5)
Eosinophils Relative: 4 %
HCT: 39.9 % (ref 39.0–52.0)
Hemoglobin: 12.8 g/dL — ABNORMAL LOW (ref 13.0–17.0)
Immature Granulocytes: 1 %
Lymphocytes Relative: 48 %
Lymphs Abs: 3 10*3/uL (ref 0.7–4.0)
MCH: 30.7 pg (ref 26.0–34.0)
MCHC: 32.1 g/dL (ref 30.0–36.0)
MCV: 95.7 fL (ref 80.0–100.0)
Monocytes Absolute: 0.5 10*3/uL (ref 0.1–1.0)
Monocytes Relative: 9 %
Neutro Abs: 2.3 10*3/uL (ref 1.7–7.7)
Neutrophils Relative %: 37 %
Platelets: 106 10*3/uL — ABNORMAL LOW (ref 150–400)
RBC: 4.17 MIL/uL — ABNORMAL LOW (ref 4.22–5.81)
RDW: 13.2 % (ref 11.5–15.5)
Smear Review: NORMAL
WBC: 6.2 10*3/uL (ref 4.0–10.5)
nRBC: 0 % (ref 0.0–0.2)

## 2021-07-08 LAB — COMPREHENSIVE METABOLIC PANEL
ALT: 14 U/L (ref 0–44)
AST: 19 U/L (ref 15–41)
Albumin: 3.3 g/dL — ABNORMAL LOW (ref 3.5–5.0)
Alkaline Phosphatase: 54 U/L (ref 38–126)
Anion gap: 9 (ref 5–15)
BUN: 11 mg/dL (ref 6–20)
CO2: 30 mmol/L (ref 22–32)
Calcium: 9.2 mg/dL (ref 8.9–10.3)
Chloride: 102 mmol/L (ref 98–111)
Creatinine, Ser: 0.66 mg/dL (ref 0.61–1.24)
GFR, Estimated: >60 mL/min (ref >60)
Glucose, Bld: 95 mg/dL (ref 70–99)
Potassium: 4.2 mmol/L (ref 3.5–5.1)
Sodium: 141 mmol/L (ref 135–145)
Total Bilirubin: 0.7 mg/dL (ref 0.3–1.2)
Total Protein: 6.6 g/dL (ref 6.5–8.1)

## 2021-07-08 LAB — BRAIN NATRIURETIC PEPTIDE: B Natriuretic Peptide: 30.8 pg/mL (ref 0.0–100.0)

## 2021-07-08 NOTE — ED Provider Notes (Signed)
? ?[facility] ?Provider Note ? ? ? Event Date/Time  ? First MD Initiated Contact with Patient 07/08/21 2334   ?  (approximate) ? ? ?History  ? ?Leg Swelling ? ? ?HPI ? ?Daniel Mitchell is a 56 y.o. male who presents to the ED for evaluation of Leg Swelling ?  ?Reviewed PCP visit from yesterday.  History of developmental delay and lives in a group home.  Diabetes, HLD and seizure disorder. ? ?Patient's caregiver brings him to the ED for evaluation of left leg pain and swelling.  Patient is bed and wheelchair bound at baseline.  Caregiver reports that patient has seemed uncomfortable and had left-sided leg pain throughout the night tonight.  Reports associated swelling throughout the leg.  Caregiver reports that he was "aggressively" turned yesterday and reports concern for injury. ? ? ?Physical Exam  ? ?Triage Vital Signs: ?ED Triage Vitals  ?Enc Vitals Group  ?   BP 07/08/21 2145 119/68  ?   Pulse Rate 07/08/21 2145 67  ?   Resp 07/08/21 2145 16  ?   Temp 07/08/21 2145 97.9 ?F (36.6 ?C)  ?   Temp Source 07/08/21 2145 Oral  ?   SpO2 07/08/21 2145 96 %  ?   Weight 07/08/21 2143 165 lb 5.5 oz (75 kg)  ?   Height --   ?   Head Circumference --   ?   Peak Flow --   ?   Pain Score 07/08/21 2143 4  ?   Pain Loc --   ?   Pain Edu? --   ?   Excl. in [place]? --   ? ? ?Most recent vital signs: ?Vitals:  ? 07/09/21 0100 07/09/21 0219  ?BP: 109/66 127/75  ?Pulse: 63 65  ?Resp: 16 18  ?Temp:    ?SpO2: 99% 99%  ? ? ?General: Awake, no distress.  ?CV:  Good peripheral perfusion.  ?Resp:  Normal effort.  ?Abd:  No distention.  ?MSK:  No deformity noted.  Left leg with trace pitting edema and slightly asymmetric compared to the right but no external signs of trauma. ?No pain with passive ranging of the right leg, but with the left leg he reports some discomfort to his left-sided proximal leg somewhere between his hip and knee. ?Neuro:  No focal deficits appreciated. ?Other:   ? ? ?ED Results / Procedures /  Treatments  ? ?Labs ?(all labs ordered are listed, but only abnormal results are displayed) ?Labs Reviewed  ?CBC WITH DIFFERENTIAL/PLATELET - Abnormal; Notable for the following components:  ?    Result Value  ? RBC 4.17 (*)   ? Hemoglobin 12.8 (*)   ? Platelets 106 (*)   ? All other components within normal limits  ?COMPREHENSIVE METABOLIC PANEL - Abnormal; Notable for the following components:  ? Albumin 3.3 (*)   ? All other components within normal limits  ?BRAIN NATRIURETIC PEPTIDE  ? ? ?EKG ? ? ?RADIOLOGY ?Plain film to the left hip and knee reviewed by me without evidence of fracture or dislocation. ? ?Official radiology report(s): ?US Venous Img Lower Unilateral Left ? ?Result Date: 07/09/2021 ?CLINICAL DATA:  Left lower leg swelling tonight. EXAM: Left LOWER EXTREMITY VENOUS DOPPLER ULTRASOUND TECHNIQUE: Gray-scale sonography with compression, as well as color and duplex ultrasound, were performed to evaluate the deep venous system(s) from the level of the common femoral vein through the popliteal and proximal calf veins. COMPARISON:  None. FINDINGS: VENOUS Normal compressibility of the common  femoral, superficial femoral, and popliteal veins, as well as the visualized calf veins. Visualized portions of profunda femoral vein and great saphenous vein unremarkable. No filling defects to suggest DVT on grayscale or color Doppler imaging. Doppler waveforms show normal direction of venous flow, normal respiratory plasticity and response to augmentation. Limited views of the contralateral common femoral vein are unremarkable. OTHER Subcutaneous soft tissue edema is demonstrated in the calf region. Limitations: none IMPRESSION: No evidence of acute deep venous thrombosis in the visualized lower extremity veins. Electronically Signed   By: Burman Nieves M.D.   On: 07/09/2021 00:59  ? ?DG Knee Complete 4 Views Left ? ?Result Date: 07/09/2021 ?CLINICAL DATA:  Pain after transfer. EXAM: LEFT KNEE - COMPLETE 4+ VIEW  COMPARISON:  None. FINDINGS: Diffuse bone demineralization. No evidence of acute fracture or dislocation. No focal bone lesion or bone destruction. Bone cortex appears intact. No significant effusion. Soft tissues are unremarkable. IMPRESSION: Diffuse bone demineralization.  No acute bony abnormalities. Electronically Signed   By: Burman Nieves M.D.   On: 07/09/2021 02:02  ? ?DG Hip Unilat W or Wo Pelvis 2-3 Views Left ? ?Result Date: 07/09/2021 ?CLINICAL DATA:  Left leg pain and swelling after transfer. EXAM: DG HIP (WITH OR WITHOUT PELVIS) 2-3V LEFT COMPARISON:  None. FINDINGS: Pelvis appears intact. No focal bone lesion or bone destruction. Degenerative changes in the hips. Left hip demonstrates valgus orientation. No acute fracture or dislocation is identified. Soft tissue calcifications likely representing injection granulomas. IMPRESSION: No acute bony abnormalities identified. Electronically Signed   By: Burman Nieves M.D.   On: 07/09/2021 02:02   ? ?PROCEDURES and INTERVENTIONS: ? ?Procedures ? ?Medications  ?acetaminophen (TYLENOL) tablet 1,000 mg (1,000 mg Oral Given 07/09/21 0221)  ?naproxen (NAPROSYN) tablet 500 mg (500 mg Oral Given 07/09/21 0221)  ? ? ? ?IMPRESSION / MDM / ASSESSMENT AND PLAN / ED COURSE  ?I reviewed the triage vital signs and the nursing notes. ? ?Bedbound 56 year old male presents to the ED with left leg pain and swelling, without evidence of acute pathology and suitable for return to group home.  He is unable to provide any relevant history.  He looks well overall without signs of systemic illness.  Blood work is similarly reassuring with normal CBC and metabolic panel.  No evidence of CHF clinically and his BNP is low.  Venous ultrasound without evidence of DVT and plain films without evidence of fracture to his areas of pain.  He can range the leg without evidence of instability and only precipitating mild pain.  Considering his caregiver telling me about an aggressive  transfer, I suspect soft tissue injury.  We discussed nonnarcotic multimodal analgesia and he is suitable for outpatient management. ? ?  ? ? ?FINAL CLINICAL IMPRESSION(S) / ED DIAGNOSES  ? ?Final diagnoses:  ?Left leg swelling  ?Left leg pain  ? ? ? ?Rx / DC Orders  ? ?ED Discharge Orders   ? ? None  ? ?  ? ? ? ?Note:  This document was prepared using Dragon voice recognition software and may include unintentional dictation errors. ?  ?Delton Pra[place], MD ?07/09/21 [phone] ? ?

## 2021-07-08 NOTE — ED Notes (Signed)
US @ the bedside. 

## 2021-07-08 NOTE — ED Triage Notes (Signed)
Pt presents to ER via ems from [facility] [place] with c/o left lower leg swelling.  Per ems, staff state they were getting pt to bathroom when they noticed some LLE swelling.  Pt has pitting edema noted to LLE.  No swelling noted to right leg at this time.  No redness or warmth noted to left leg.  Pt does not have hx of CHF per his paperwork and chart.  Pt only alert to self at this time.  Pt does have hx of developmental delay.  No acute distress noted at this time.  ?

## 2021-07-09 ENCOUNTER — Emergency Department: Payer: Medicare Other

## 2021-07-09 DIAGNOSIS — M79605 Pain in left leg: Secondary | ICD-10-CM | POA: Diagnosis not present

## 2021-07-09 MED ORDER — NAPROXEN 500 MG PO TABS
500.0000 mg | ORAL_TABLET | Freq: Once | ORAL | Status: AC
  Administered 2021-07-09: 500 mg via ORAL
  Filled 2021-07-09: qty 1

## 2021-07-09 MED ORDER — ACETAMINOPHEN 500 MG PO TABS
1000.0000 mg | ORAL_TABLET | Freq: Once | ORAL | Status: AC
  Administered 2021-07-09: 1000 mg via ORAL
  Filled 2021-07-09: qty 2

## 2021-07-09 NOTE — ED Notes (Signed)
[facility] to transport pt to home 

## 2021-07-09 NOTE — Discharge Instructions (Signed)
No signs of blood clot to the left leg or broken bones around the hip and knee.  ? ?Please take Tylenol and ibuprofen/Advil for your pain.  It is safe to take them together, or to alternate them every few hours.  Take up to 1000mg  of Tylenol at a time, up to 4 times per day.  Do not take more than 4000 mg of Tylenol in 24 hours.  For ibuprofen, take 400-600 mg, 4-5 times per day. ? ?

## 2021-07-09 NOTE — ED Notes (Signed)
Pt to XR

## 2021-11-12 ENCOUNTER — Other Ambulatory Visit: Payer: Self-pay

## 2022-07-23 ENCOUNTER — Other Ambulatory Visit: Payer: Self-pay

## 2022-07-23 ENCOUNTER — Inpatient Hospital Stay
Admission: EM | Admit: 2022-07-23 | Discharge: 2022-07-29 | DRG: 291 | Disposition: A | Discharge status: Hospice | Payer: Medicare Other | Attending: Internal Medicine | Admitting: Internal Medicine

## 2022-07-23 ENCOUNTER — Emergency Department: Payer: Medicare Other

## 2022-07-23 DIAGNOSIS — Z993 Dependence on wheelchair: Secondary | ICD-10-CM

## 2022-07-23 DIAGNOSIS — R627 Adult failure to thrive: Secondary | ICD-10-CM | POA: Diagnosis present

## 2022-07-23 DIAGNOSIS — G40909 Epilepsy, unspecified, not intractable, without status epilepticus: Secondary | ICD-10-CM | POA: Diagnosis present

## 2022-07-23 DIAGNOSIS — Z881 Allergy status to other antibiotic agents status: Secondary | ICD-10-CM

## 2022-07-23 DIAGNOSIS — I110 Hypertensive heart disease with heart failure: Secondary | ICD-10-CM | POA: Diagnosis not present

## 2022-07-23 DIAGNOSIS — J189 Pneumonia, unspecified organism: Secondary | ICD-10-CM | POA: Diagnosis not present

## 2022-07-23 DIAGNOSIS — S728X2A Other fracture of left femur, initial encounter for closed fracture: Secondary | ICD-10-CM | POA: Diagnosis present

## 2022-07-23 DIAGNOSIS — E1165 Type 2 diabetes mellitus with hyperglycemia: Secondary | ICD-10-CM | POA: Diagnosis present

## 2022-07-23 DIAGNOSIS — A419 Sepsis, unspecified organism: Secondary | ICD-10-CM | POA: Diagnosis not present

## 2022-07-23 DIAGNOSIS — E1140 Type 2 diabetes mellitus with diabetic neuropathy, unspecified: Secondary | ICD-10-CM | POA: Diagnosis present

## 2022-07-23 DIAGNOSIS — I509 Heart failure, unspecified: Secondary | ICD-10-CM

## 2022-07-23 DIAGNOSIS — R569 Unspecified convulsions: Secondary | ICD-10-CM

## 2022-07-23 DIAGNOSIS — R6521 Severe sepsis with septic shock: Secondary | ICD-10-CM | POA: Diagnosis present

## 2022-07-23 DIAGNOSIS — Z515 Encounter for palliative care: Secondary | ICD-10-CM

## 2022-07-23 DIAGNOSIS — I2489 Other forms of acute ischemic heart disease: Secondary | ICD-10-CM | POA: Diagnosis present

## 2022-07-23 DIAGNOSIS — Z823 Family history of stroke: Secondary | ICD-10-CM

## 2022-07-23 DIAGNOSIS — J9601 Acute respiratory failure with hypoxia: Secondary | ICD-10-CM | POA: Diagnosis not present

## 2022-07-23 DIAGNOSIS — Z7984 Long term (current) use of oral hypoglycemic drugs: Secondary | ICD-10-CM

## 2022-07-23 DIAGNOSIS — S72452A Displaced supracondylar fracture without intracondylar extension of lower end of left femur, initial encounter for closed fracture: Secondary | ICD-10-CM | POA: Diagnosis present

## 2022-07-23 DIAGNOSIS — E119 Type 2 diabetes mellitus without complications: Secondary | ICD-10-CM

## 2022-07-23 DIAGNOSIS — F09 Unspecified mental disorder due to known physiological condition: Secondary | ICD-10-CM | POA: Diagnosis present

## 2022-07-23 DIAGNOSIS — Z79899 Other long term (current) drug therapy: Secondary | ICD-10-CM

## 2022-07-23 DIAGNOSIS — R600 Localized edema: Principal | ICD-10-CM

## 2022-07-23 DIAGNOSIS — E118 Type 2 diabetes mellitus with unspecified complications: Secondary | ICD-10-CM | POA: Diagnosis present

## 2022-07-23 DIAGNOSIS — D649 Anemia, unspecified: Secondary | ICD-10-CM | POA: Diagnosis present

## 2022-07-23 DIAGNOSIS — I5033 Acute on chronic diastolic (congestive) heart failure: Secondary | ICD-10-CM | POA: Diagnosis present

## 2022-07-23 DIAGNOSIS — Z888 Allergy status to other drugs, medicaments and biological substances status: Secondary | ICD-10-CM

## 2022-07-23 DIAGNOSIS — R6250 Unspecified lack of expected normal physiological development in childhood: Secondary | ICD-10-CM | POA: Diagnosis present

## 2022-07-23 DIAGNOSIS — D696 Thrombocytopenia, unspecified: Secondary | ICD-10-CM | POA: Diagnosis present

## 2022-07-23 DIAGNOSIS — F32A Depression, unspecified: Secondary | ICD-10-CM | POA: Diagnosis present

## 2022-07-23 DIAGNOSIS — Z66 Do not resuscitate: Secondary | ICD-10-CM | POA: Diagnosis present

## 2022-07-23 DIAGNOSIS — F79 Unspecified intellectual disabilities: Secondary | ICD-10-CM | POA: Diagnosis present

## 2022-07-23 DIAGNOSIS — R1310 Dysphagia, unspecified: Secondary | ICD-10-CM | POA: Diagnosis present

## 2022-07-23 DIAGNOSIS — E876 Hypokalemia: Secondary | ICD-10-CM | POA: Diagnosis present

## 2022-07-23 DIAGNOSIS — J690 Pneumonitis due to inhalation of food and vomit: Secondary | ICD-10-CM | POA: Diagnosis present

## 2022-07-23 DIAGNOSIS — Z7401 Bed confinement status: Secondary | ICD-10-CM

## 2022-07-23 DIAGNOSIS — W19XXXA Unspecified fall, initial encounter: Secondary | ICD-10-CM | POA: Diagnosis present

## 2022-07-23 LAB — CBC
HCT: 27 % — ABNORMAL LOW (ref 39.0–52.0)
Hemoglobin: 8.7 g/dL — ABNORMAL LOW (ref 13.0–17.0)
MCH: 30.9 pg (ref 26.0–34.0)
MCHC: 32.2 g/dL (ref 30.0–36.0)
MCV: 95.7 fL (ref 80.0–100.0)
Platelets: 144 10*3/uL — ABNORMAL LOW (ref 150–400)
RBC: 2.82 MIL/uL — ABNORMAL LOW (ref 4.22–5.81)
RDW: 14.1 % (ref 11.5–15.5)
WBC: 6 10*3/uL (ref 4.0–10.5)
nRBC: 0 % (ref 0.0–0.2)

## 2022-07-23 LAB — COMPREHENSIVE METABOLIC PANEL
ALT: 14 U/L (ref 0–44)
AST: 21 U/L (ref 15–41)
Albumin: 2.5 g/dL — ABNORMAL LOW (ref 3.5–5.0)
Alkaline Phosphatase: 47 U/L (ref 38–126)
Anion gap: 10 (ref 5–15)
BUN: 13 mg/dL (ref 6–20)
CO2: 25 mmol/L (ref 22–32)
Calcium: 8.3 mg/dL — ABNORMAL LOW (ref 8.9–10.3)
Chloride: 97 mmol/L — ABNORMAL LOW (ref 98–111)
Creatinine, Ser: 0.51 mg/dL — ABNORMAL LOW (ref 0.61–1.24)
GFR, Estimated: >60 mL/min (ref >60)
Glucose, Bld: 185 mg/dL — ABNORMAL HIGH (ref 70–99)
Potassium: 3.6 mmol/L (ref 3.5–5.1)
Sodium: 132 mmol/L — ABNORMAL LOW (ref 135–145)
Total Bilirubin: 0.8 mg/dL (ref 0.3–1.2)
Total Protein: 5.7 g/dL — ABNORMAL LOW (ref 6.5–8.1)

## 2022-07-23 LAB — BRAIN NATRIURETIC PEPTIDE: B Natriuretic Peptide: 53.8 pg/mL (ref 0.0–100.0)

## 2022-07-23 LAB — TROPONIN I (HIGH SENSITIVITY): Troponin I (High Sensitivity): 44 ng/L — ABNORMAL HIGH

## 2022-07-23 MED ORDER — FUROSEMIDE 10 MG/ML IJ SOLN
60.0000 mg | Freq: Once | INTRAMUSCULAR | Status: AC
  Administered 2022-07-23: 60 mg via INTRAVENOUS
  Filled 2022-07-23: qty 8

## 2022-07-23 NOTE — ED Triage Notes (Signed)
Pt brought from [facility] for c/o bil pain and pitting edema. Staff gave pt his PRN lasix and tylenol PTA. Pt is not oriented which is baseline per EMS. Pt appears in no obvious distress  Yvone Neu [phone] is the staff member from [facility]

## 2022-07-23 NOTE — ED Provider Notes (Signed)
[facility] Provider Note    Event Date/Time   First MD Initiated Contact with Patient 07/23/22 2145     (approximate)  History   Chief Complaint: Leg Swelling and Leg Pain (bil)  HPI  Daniel Mitchell is a 57 y.o. male with a past medical history of diabetes, developmental delay, hypertension, presents to the emergency department for lower extremity edema.  According to EMS patient is being brought from his care facility where they noted the patient have increase leg pain and swelling.  They state the patient is prescribed as needed Lasix which they did give today.  Upon arrival patient is mildly tachycardic around 120 bpm with a 96% pulse ox on room air.  Patient is unable to provide any meaningful history, per report this is patient's baseline.  Physical Exam   Triage Vital Signs: ED Triage Vitals [07/23/22 2149]  Enc Vitals Group     BP (!) 145/92     Pulse Rate (!) 120     Resp 18     Temp 98.2 F (36.8 C)     Temp src      SpO2 96 %     Weight      Height      Head Circumference      Peak Flow      Pain Score      Pain Loc      Pain Edu?      Excl. in [place]?     Most recent vital signs: Vitals:   07/23/22 2149  BP: (!) 145/92  Pulse: (!) 120  Resp: 18  Temp: 98.2 F (36.8 C)  SpO2: 96%    General: Awake, no distress.  Somewhat pale in appearance. CV:  Good peripheral perfusion.  Regular rate and rhythm  Resp:  Normal effort.  Equal breath sounds bilaterally.  Abd:  No distention.  Soft, nontender.  No rebound or guarding. Other:  1-2+ pitting edema in the lower extremities bilaterally.   ED Results / Procedures / Treatments   EKG  EKG viewed and interpreted by myself shows sinus tachycardia 119 bpm with a narrow QRS, normal axis, normal intervals, nonspecific ST changes.  RADIOLOGY  Reviewed and interpreted the chest x-ray images.  No obvious consolidation seen on my evaluation. Radiology is read the chest x-ray is negative  for pulmonary edema.   MEDICATIONS ORDERED IN ED: Medications - No data to display   IMPRESSION / MDM / [place] / ED COURSE  I reviewed the triage vital signs and the nursing notes.  Patient's presentation is most consistent with acute presentation with potential threat to life or bodily function.  Patient presents emergency department for worsening lower extremity edema bilaterally.  Patient was given as needed Lasix today but is not clear if the patient had been receiving it prior to today.  Given the patient's mild tachycardia and lower extremity edema we will check labs including cardiac enzymes and a BNP will obtain a chest x-ray to evaluate for pulmonary edema we will continue to closely monitor while awaiting results.  Patient is also somewhat pale in appearance that is unclear if this is the patient's baseline CBC is pending to look for anemia.   Patient's labs do show anemia with a hemoglobin of 8.7 which is approximately 4 points down from 1 year ago.  Rectal examination shows light brown stool that is guaiac negative.  The anemia could be causing high-output heart failure leading to increased  peripheral edema.  Given the patient's increased peripheral edema we will dose 60 mg of IV Lasix.  Chemistry shows no significant findings.  Troponin slightly elevated at 44 which is more likely due to demand ischemia given the patient's continued tachycardia around 110 to 120 bpm.  Chest x-ray reassuringly shows no pulmonary edema.  Patient's caregiver is now here with him and states he had an unwitnessed fall yesterday.  Is complaining of bilateral knee pain and she states a history of a knee fracture from a fall previously.  We will obtain x-rays of both knees as well as a CT scan of the head.  Given the patient's anemia with persistent tachycardia worsening peripheral edema and a mild elevated troponin we will admit to the hospital service for ongoing workup and treatment.  FINAL  CLINICAL IMPRESSION(S) / ED DIAGNOSES   Peripheral edema Troponin elevation Anemia Tachycardia   Note:  This document was prepared using Dragon voice recognition software and may include unintentional dictation errors.   Harvest Dark, MD 07/23/22 [phone]

## 2022-07-23 NOTE — ED Notes (Signed)
ED Provider at bedside. Jasmine from [facility] is at the bedside. Sts pt fell on Wednesday but was seen the the [facility] the next day. Asking MD for knee xrays.

## 2022-07-23 NOTE — ED Notes (Signed)
ED Provider at bedside. 

## 2022-07-23 NOTE — ED Notes (Signed)
Patient transported back from CT 

## 2022-07-23 NOTE — ED Notes (Signed)
Patient transported to CT 

## 2022-07-23 NOTE — Hospital Course (Signed)
Admission: CHF ?  Fell yesterday. Edema 2 + BL / TNI 44 demand  Tachycardic.  Anemia : 8.7 / FOBT negative.  Head ct is pending.  IQ delay can't give history.  Coming from [facility].

## 2022-07-23 NOTE — H&P (Incomplete)
History and Physical     Patient: DEMARIO RISBERG [id number] DOB: 1965-10-15 DOA: 07/23/2022 DOS: the patient was seen and examined on 07/24/2022 PCP: Kirk Ruths, MD   Patient coming from: [facility] Chief Complaint: LE edema/ CHF concern.  HISTORY OF PRESENT ILLNESS: AGNES WALLEN is an 57 y.o. male seen in ed for LE edema. Pt had a fall on Wednesday at his facility. Caretaker at bed side. HPI is limited and per staff and nurse and EDMD note.  Patient is prescribed Lasix as needed as part of his home regimen reports leg swelling.  Report patient is currently at his baseline but is less conversive than his usual a.m. patient is alert awake and cooperative.  Emergency room patient has bilateral lower extremity Lasix.pt also noted to be tachycardia. Pt has negative guaiac in ED. TNI is elevated at 44 which is most probably form demand ischemia. Pt has new edema/ anemia/ leg pain.  Stat venous doppler. We will also obtain leg pain.  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   Urethral foreign body 08/05/14   Dr. Bernardo Heater   Urogenital disorder    Multiple surgeries; H/O Undescended testicle; nocturnal enuresis   Review of Systems  Unable to perform ROS: Other  Cardiovascular:  Positive for leg swelling.   Allergies  Allergen Reactions   Topamax [Topiramate]    Zithromax [Azithromycin]    Past Surgical History:  Procedure Laterality Date   CYSTOSCOPY  04/09/14   Status post cystoscopy and removal of calculus   CYSTOSCOPY  09/03/14   Status post cystoscopy with removal of calculus   INGUINAL HERNIA REPAIR      MEDICATIONS: Prior to Admission medications   Medication Sig Start Date End Date Taking? Authorizing Provider  acetaminophen (TYLENOL) 500 MG tablet Take 500 mg by mouth every 4 (four) hours as needed.    [provider]  azelastine (ASTELIN) 0.1 % nasal spray Place 2 sprays into both nostrils 2 (two) times daily. Use in each nostril as directed    [provider]  baclofen (LIORESAL) 10 MG tablet Take 10 mg by mouth 3 (three) times daily.    [provider]  cetirizine (ZYRTEC) 10 MG tablet Take 10 mg by mouth daily.    [provider]  Cholecalciferol (VITAMIN D) 50 MCG (2000 UT) tablet Take 2,000 Units by mouth daily.    [provider]  diclofenac sodium (VOLTAREN) 1 % GEL Apply 2 g topically 4 (four) times daily.    [provider]  divalproex (DEPAKOTE) 500 MG DR tablet Take 1,000 mg by mouth 2 (two) times daily.    [provider]  docusate sodium (COLACE) 100 MG capsule Take 100 mg by mouth 2 (two) times daily as needed.     [provider]  ferrous sulfate 325 (65 FE) MG tablet Take 325 mg by mouth daily.    [provider]  loratadine (CLARITIN) 10 MG tablet Take 10 mg by mouth daily.    [provider]  metFORMIN (GLUCOPHAGE) 500 MG tablet Take 500 mg by mouth  2 (two) times daily with a meal.    [provider]  nystatin (MYCOSTATIN) powder Apply topically 2 (two) times daily.    [provider]  Olopatadine HCl (PATANASE) 0.6 % SOLN Place into the nose. Nasal spray: Used 2 sprays in each nostril twice daily as needed for nasal congestion.    [provider]  paliperidone (INVEGA) 3 MG 24 hr tablet Take 3 mg by mouth every morning.    [provider]  pravastatin (PRAVACHOL) 20 MG tablet Take 20 mg by mouth daily.    [provider]  risperiDONE (RISPERDAL M-TABS) 1 MG disintegrating tablet Take 2 mg by mouth. Dissolve 1 tablet under tongue as needed  for agitation. May repeat in 20 minutes.    [provider]  sertraline (ZOLOFT) 50 MG tablet Take 50 mg by mouth daily.    [provider]  terbinafine (LAMISIL) 1 % cream Apply 1 application topically daily.    [provider]  torsemide (DEMADEX) 5 MG tablet Take 5 mg by mouth every Monday, Wednesday, and Friday.    [provider]   ED Course: Pt in Ed is alert/ cooperative meets SIRS criteria.   Vitals:   07/23/22 2300 07/23/22 2351 07/24/22 0000 07/24/22 0030  BP: (!) 147/87 (!) 143/88 124/87 (!) 136/91  Pulse: (!) 114 (!) 113 (!) 115 (!) 114  Resp: 17 11 15 14   Temp:      SpO2: 96% 98% 96% 96%   No intake/output data recorded. SpO2: 96 % Blood work in ed shows: -Anemia with hemoglobin of 8.7 and platelet 144.  -CMP show hyponatremia and normal LFT.  Results for orders placed or performed during the hospital encounter of 07/23/22 (from the past 72 hour(s))  CBC     Status: Abnormal   Collection Time: 07/23/22  9:56 PM  Result Value Ref Range   WBC 6.0 4.0 - 10.5 K/uL   RBC 2.82 (L) 4.22 - 5.81 MIL/uL   Hemoglobin 8.7 (L) 13.0 - 17.0 g/dL   HCT 27.0 (L) 39.0 - 52.0 %   MCV 95.7 80.0 - 100.0 fL   MCH 30.9 26.0 - 34.0 pg   MCHC 32.2 30.0 - 36.0 g/dL   RDW 14.1 11.5 - 15.5 %   Platelets 144 (L) 150 - 400 K/uL   nRBC 0.0 0.0 - 0.2 %    Comment: Performed at [facility], [address] [place], [place] [address]  Comprehensive metabolic panel     Status: Abnormal   Collection Time: 07/23/22  9:56 PM  Result Value Ref Range   Sodium 132 (L) 135 - 145 mmol/L   Potassium 3.6 3.5 - 5.1 mmol/L   Chloride 97 (L) 98 - 111 mmol/L   CO2 25 22 - 32 mmol/L   Glucose, Bld 185 (H) 70 - 99 mg/dL    Comment: Glucose reference range applies only to samples taken after fasting for at least 8 hours.   BUN 13 6 - 20 mg/dL   Creatinine, Ser 0.51 (L) 0.61 - 1.24 mg/dL   Calcium 8.3 (L) 8.9 - 10.3 mg/dL   Total Protein 5.7 (L) 6.5 - 8.1 g/dL    Albumin 2.5 (L) 3.5 - 5.0 g/dL   AST 21 15 - 41 U/L   ALT 14 0 - 44 U/L   Alkaline Phosphatase 47 38 - 126 U/L   Total Bilirubin 0.8 0.3 - 1.2 mg/dL   GFR, Estimated >60 >60 mL/min    Comment: (  NOTE) Calculated using the CKD-EPI Creatinine Equation (2021)    Anion gap 10 5 - 15    Comment: Performed at [facility], [place], [place] [address]  Troponin I (High Sensitivity)     Status: Abnormal   Collection Time: 07/23/22  9:56 PM  Result Value Ref Range   Troponin I (High Sensitivity) 44 (H) <18 ng/L    Comment: (NOTE) Elevated high sensitivity troponin I (hsTnI) values and significant  changes across serial measurements may suggest ACS but many other  chronic and acute conditions are known to elevate hsTnI results.  Refer to the "Links" section for chest pain algorithms and additional  guidance. Performed at [facility], [address] [place], [place] [address]   Brain natriuretic peptide     Status: None   Collection Time: 07/23/22  9:56 PM  Result Value Ref Range   B Natriuretic Peptide 53.8 0.0 - 100.0 pg/mL    Comment: Performed at [facility], [address] [place], [place] [address]  Magnesium     Status: None   Collection Time: 07/23/22  9:56 PM  Result Value Ref Range   Magnesium 2.1 1.7 - 2.4 mg/dL    Comment: Performed at [facility], [address] [place], [place] [address]    Lab Results  Component Value Date   CREATININE 0.51 (L) 07/23/2022   CREATININE 0.66 07/08/2021   CREATININE 0.56 (L) 02/16/2018      Latest Ref Rng & Units 07/23/2022    9:56 PM 07/08/2021    9:47 PM 02/16/2018   11:04 AM  CMP  Glucose 70 - 99 mg/dL 185  95  159   BUN 6 - 20 mg/dL 13  11  6    Creatinine 0.61 - 1.24 mg/dL 0.51  0.66  0.56   Sodium 135 - 145 mmol/L 132  141  139   Potassium 3.5 - 5.1 mmol/L 3.6  4.2  3.9   Chloride 98 - 111 mmol/L 97  102  100   CO2 22 - 32 mmol/L 25  30  30    Calcium 8.9 - 10.3  mg/dL 8.3  9.2  9.3   Total Protein 6.5 - 8.1 g/dL 5.7  6.6  6.8   Total Bilirubin 0.3 - 1.2 mg/dL 0.8  0.7  0.5   Alkaline Phos 38 - 126 U/L 47  54  46   AST 15 - 41 U/L 21  19  21    ALT 0 - 44 U/L 14  14  16     Unresulted Labs (From admission, onward)     Start     Ordered   07/24/22 0500  Comprehensive metabolic panel  Tomorrow morning,   STAT        07/24/22 0005   07/24/22 0500  CBC  Tomorrow morning,   STAT        07/24/22 0005   07/24/22 0003  HIV Antibody (routine testing w rflx)  (HIV Antibody (Routine testing w reflex) panel)  Once,   URGENT        07/24/22 0005   07/23/22 2300  Urinalysis, Complete w Microscopic -Urine, Clean Catch  Once,   URGENT       Question:  Specimen Source  Answer:  Urine, Clean Catch   07/23/22 2259           Pt has received : Orders Placed This Encounter  Procedures   DG Chest Portable 1 View  Standing Status:   Standing    Number of Occurrences:   1    Order Specific Question:   Reason for Exam (SYMPTOM  OR DIAGNOSIS REQUIRED)    Answer:   peripheral edema   DG Knee Left Port    Standing Status:   Standing    Number of Occurrences:   1    Order Specific Question:   Reason for Exam (SYMPTOM  OR DIAGNOSIS REQUIRED)    Answer:   fall pain   DG Knee Right Port    Standing Status:   Standing    Number of Occurrences:   1    Order Specific Question:   Reason for Exam (SYMPTOM  OR DIAGNOSIS REQUIRED)    Answer:   fall pain   CT HEAD WO CONTRAST (5MM)    Standing Status:   Standing    Number of Occurrences:   1   US Venous Img Lower Bilateral (DVT)    Standing Status:   Standing    Number of Occurrences:   1    Order Specific Question:   Symptom/Reason for Exam    Answer:   Bilateral leg edema [id number]   CBC    Standing Status:   Standing    Number of Occurrences:   1   Comprehensive metabolic panel    Standing Status:   Standing    Number of Occurrences:   1   Brain natriuretic peptide    Standing Status:   Standing    Number  of Occurrences:   1   Urinalysis, Complete w Microscopic -Urine, Clean Catch    Standing Status:   Standing    Number of Occurrences:   1    Order Specific Question:   Specimen Source    Answer:   Urine, Clean Catch [76]   HIV Antibody (routine testing w rflx)    Standing Status:   Standing    Number of Occurrences:   1   Comprehensive metabolic panel    Standing Status:   Standing    Number of Occurrences:   1   CBC    Standing Status:   Standing    Number of Occurrences:   1   Magnesium    Standing Status:   Standing    Number of Occurrences:   1   Maintain IV access    Standing Status:   Standing    Number of Occurrences:   1   Vital signs    Standing Status:   Standing    Number of Occurrences:   1   Notify physician (specify)    Standing Status:   Standing    Number of Occurrences:   20    Order Specific Question:   Notify Physician    Answer:   for pulse less than 55 or greater than 120    Order Specific Question:   Notify Physician    Answer:   for respiratory rate less than 12 or greater than 25    Order Specific Question:   Notify Physician    Answer:   for temperature greater than 100.5 F    Order Specific Question:   Notify Physician    Answer:   for urinary output less than 30 mL/hr for four hours    Order Specific Question:   Notify Physician    Answer:   for systolic BP less than 90 or greater than 0000000, diastolic BP less than 60 or greater than 100  Order Specific Question:   Notify Physician    Answer:   for new hypoxia w/ oxygen saturations < 88%   Progressive Mobility Protocol: No Restrictions    Standing Status:   Standing    Number of Occurrences:   1   Daily weights    Standing Status:   Standing    Number of Occurrences:   1   Intake and Output    Standing Status:   Standing    Number of Occurrences:   1   Initiate Oral Care Protocol    Standing Status:   Standing    Number of Occurrences:   1   Initiate Carrier Fluid Protocol    Standing  Status:   Standing    Number of Occurrences:   1   RN may order General Admission PRN Orders utilizing "General Admission PRN medications" (through manage orders) for the following patient needs: allergy symptoms (Claritin), cold sores (Carmex), cough (Robitussin DM), eye irritation (Liquifilm Tears), hemorrhoids (Tucks), indigestion (Maalox), minor skin irritation (Hydrocortisone Cream), muscle pain Suezanne Jacquet Gay), nose irritation (saline nasal spray) and sore throat (Chloraseptic spray).    Standing Status:   Standing    Number of Occurrences:   [id number]   Cardiac Monitoring Continuous x 48 hours Indications for use: Other; Other indications for use: chf    Standing Status:   Standing    Number of Occurrences:   1    Order Specific Question:   Indications for use:    Answer:   Other    Order Specific Question:   Other indications for use:    Answer:   chf   Strict intake and output    Standing Status:   Standing    Number of Occurrences:   1   Care order/instruction: Offer water 200 cc every four hour.    Offer water 200 cc every four hour.    Standing Status:   Standing    Number of Occurrences:   1   Full code    Standing Status:   Standing    Number of Occurrences:   1    Order Specific Question:   By:    Answer:   Other   Consult to hospitalist    Standing Status:   Standing    Number of Occurrences:   1    Order Specific Question:   Place call to:    Answer:   [facility]    Order Specific Question:   Reason for Consult    Answer:   Admit    Order Specific Question:   Diagnosis/Clinical Info for Consult:    Answer:   Edema, tachy, anemia   Pulse oximetry check with vital signs    Standing Status:   Standing    Number of Occurrences:   1   Oxygen therapy Mode or (Route): Nasal cannula; Liters Per Minute: 2; Keep 02 saturation: greater than 92 %    Standing Status:   Standing    Number of Occurrences:   20    Order Specific Question:   Mode or (Route)    Answer:   Nasal cannula     Order Specific Question:   Liters Per Minute    Answer:   2    Order Specific Question:   Keep 02 saturation    Answer:   greater than 92 %   EKG 12-Lead    Standing Status:   Standing    Number of Occurrences:   1  ECHOCARDIOGRAM COMPLETE    Standing Status:   Standing    Number of Occurrences:   1    Order Specific Question:   Perflutren DEFINITY (image enhancing agent) should be administered unless hypersensitivity or allergy exist    Answer:   Administer Perflutren    Order Specific Question:   Reason for exam-Echo    Answer:   Other-Full Diagnosis List    Order Specific Question:   Full ICD-10/Reason for Exam    Answer:   Bilateral leg edema [id number]   Place in observation (patient's expected length of stay will be less than 2 midnights)    Standing Status:   Standing    Number of Occurrences:   1    Order Specific Question:   Hospital Area    Answer:   [place] [100120]    Order Specific Question:   Level of Care    Answer:   Telemetry Medical [104]    Order Specific Question:   Covid Evaluation    Answer:   Asymptomatic - no recent exposure (last 10 days) testing not required    Order Specific Question:   Diagnosis    Answer:   CHF (congestive heart failure) [id number]    Order Specific Question:   Admitting Physician    Answer:   Cherylann Ratel    Order Specific Question:   Attending Physician    Answer:   Cherylann Ratel   Aspiration precautions    Standing Status:   Standing    Number of Occurrences:   1   Fall precautions    Standing Status:   Standing    Number of Occurrences:   1   Seizure precautions    Standing Status:   Standing    Number of Occurrences:   1    Meds ordered this encounter  Medications   furosemide (LASIX) injection 60 mg   sertraline (ZOLOFT) tablet 50 mg   pravastatin (PRAVACHOL) tablet 20 mg   paliperidone (INVEGA) 24 hr tablet 3 mg   divalproex (DEPAKOTE) DR tablet 1,000 mg   baclofen (LIORESAL)  tablet 10 mg   heparin injection 5,000 Units   sodium chloride flush (NS) 0.9 % injection 3 mL   OR Linked Order Group    acetaminophen (TYLENOL) tablet 650 mg    acetaminophen (TYLENOL) suppository 650 mg   HYDROcodone-acetaminophen (NORCO/VICODIN) 5-325 MG per tablet 1 tablet   morphine (PF) 2 MG/ML injection 2 mg   lactated ringers infusion   ipratropium (ATROVENT) nebulizer solution 0.5 mg    Admission Imaging : CT HEAD WO CONTRAST (5MM)  Result Date: 07/23/2022 CLINICAL DATA:  [place] on Wednesday, head trauma EXAM: CT HEAD WITHOUT CONTRAST TECHNIQUE: Contiguous axial images were obtained from the base of the skull through the vertex without intravenous contrast. RADIATION DOSE REDUCTION: This exam was performed according to the departmental dose-optimization program which includes automated exposure control, adjustment of the mA and/or kV according to patient size and/or use of iterative reconstruction technique. COMPARISON:  02/16/2018 FINDINGS: Brain: No acute infarct or hemorrhage. Lateral ventricles and midline structures are unremarkable. Age advanced cerebral and cerebellar atrophy. No acute extra-axial fluid collections. No mass effect. Vascular: No hyperdense vessel or unexpected calcification. Skull: Normal. Negative for fracture or focal lesion. Sinuses/Orbits: No acute finding. Other: None. IMPRESSION: 1. No acute intracranial process. Electronically Signed   By: Randa Ngo M.D.   On: 07/23/2022 23:22   DG Knee Right Port  Result  Date: 07/23/2022 CLINICAL DATA:  Fall, pain EXAM: PORTABLE RIGHT KNEE - 1-2 VIEW COMPARISON:  02/23/2014 FINDINGS: There is deformity within the distal left femur which appears to be related to old healed fracture. Moderate tricompartment degenerative changes. Moderate joint effusion. No subluxation or dislocation. IMPRESSION: Apparent old healed distal femoral fracture with deformity, degenerative changes and moderate joint effusion. No definite acute  bony abnormality. Electronically Signed   By: Rolm Baptise M.D.   On: 07/23/2022 23:22   DG Knee Left Port  Result Date: 07/23/2022 CLINICAL DATA:  Fall, pain EXAM: PORTABLE LEFT KNEE - 1-2 VIEW COMPARISON:  07/09/2021 FINDINGS: There is a comminuted fracture noted through the distal left femoral metaphysis, possibly extending intra-articular into the knee joint. Mild displacement and angulation. Associated joint effusion. No subluxation or dislocation. IMPRESSION: Comminuted, mildly displaced and angulated distal left femoral fracture with probable intra-articular extension. Associated joint effusion. Electronically Signed   By: Rolm Baptise M.D.   On: 07/23/2022 23:21   DG Chest Portable 1 View  Result Date: 07/23/2022 CLINICAL DATA:  Peripheral edema EXAM: PORTABLE CHEST 1 VIEW COMPARISON:  03/19/2016 FINDINGS: Mild left basilar atelectasis or scarring. Lungs are otherwise clear. No pneumothorax or pleural effusion. Cardiac size within normal limits. No acute bone abnormality. IMPRESSION: 1. Mild left basilar atelectasis or scarring. Electronically Signed   By: Fidela Salisbury M.D.   On: 07/23/2022 22:30   Physical Examination: Vitals:   07/23/22 2300 07/23/22 2351 07/24/22 0000 07/24/22 0030  BP: (!) 147/87 (!) 143/88 124/87 (!) 136/91  Pulse: (!) 114 (!) 113 (!) 115 (!) 114  Temp:      Resp: 17 11 15 14   SpO2: 96% 98% 96% 96%   Physical Exam Vitals reviewed.  HENT:     Head: Normocephalic and atraumatic.  Eyes:     Extraocular Movements: Extraocular movements intact.  Cardiovascular:     Rate and Rhythm: Normal rate and regular rhythm.     Pulses: Normal pulses.     Heart sounds: Normal heart sounds.  Pulmonary:     Effort: Pulmonary effort is normal.     Breath sounds: Normal breath sounds.  Abdominal:     General: Bowel sounds are normal. There is no distension.     Palpations: Abdomen is soft.     Tenderness: There is no abdominal tenderness. There is no guarding.   Genitourinary:    Rectum: Guaiac result negative.  Musculoskeletal:     Right lower leg: Edema present.     Left lower leg: Edema present.  Neurological:     General: No focal deficit present.     Mental Status: He is alert.     Cranial Nerves: Cranial nerves 2-12 are intact.     Comments: Pt is bedridden and complete care.        Assessment and Plan: * CHF (congestive heart failure) (HCC) No diagnosis of CHF. ? If this is dependant Edema. We will monitor strict I/O. Pt was given diuretic in Ed.  Cont diet from what pt has at facility with restriction and or recommendation.    .   Other fracture of left femur, initial encounter for closed fracture (HCC) Comminuted, mildly displaced and angulated distal left femoral fracture with probable intra-articular extension. Associated joint effusion.Apparent old healed distal femoral fracture with deformity, degenerative changes and moderate joint effusion. No definite acute bony abnormality. Orthopedic consult per am team.    Edema of both legs LE edema. D/d include dependant edema , DVT ,  Fracture.  We will do LE venous Doppler. Pt has previously fractured his right leg and today comes with leg swelling and found to have left leg fracture.  Orthopedic consult per Am team . Suspect 2/2 to pt's fall on Wednesday.  Pt has no know history of heart failure.      Unspecified convulsions ([place]) We will place pt on seizure/ aspiration  precaution. Continue depakote and check level.    Type II diabetes mellitus (HCC) Glycemic protocol.  Hold metformin.   Controlled type 2 diabetes mellitus with complication, without long-term current use of insulin (HCC) Pt on metformin. We will hold. Start SSI.    Cognitive disorder Pt lives at [facility]. Mom is involved in his care.     DVT prophylaxis:  Heparin.   Code Status:  Full Code.       07/08/2021    9:46 PM  Advanced Directives  Does Patient Have a  Medical Advance Directive? No    Family Communication:  Ahmod, Anelli (Mother)  [phone]   Emergency Contact: Contact Information     Name Relation Home Work [place] K Mother [phone]         Disposition Plan:  SNF.   Consults: None.  Admission status: Inpatient.   Unit / Expected LOS: Med tele / 2 day.s    Para Skeans MD [facility]  6 PM- 2 AM. [phone]( Pager ) Please use [email or web address] to contact the current [facility] MD  based on hours  and team and services or  You may call [phone] to contact current Assigned [facility] Attending/Consulting MD for this patient.  This number is the [facility] ADMIT/Consult system  and will be able to assist any patient in any [place] location .

## 2022-07-24 ENCOUNTER — Observation Stay: Payer: Medicare Other

## 2022-07-24 ENCOUNTER — Observation Stay (HOSPITAL_COMMUNITY)
Admit: 2022-07-24 | Discharge: 2022-07-24 | Disposition: A | Discharge status: Home / Self Care | Payer: Medicare Other | Attending: Internal Medicine | Admitting: Internal Medicine

## 2022-07-24 DIAGNOSIS — Z993 Dependence on wheelchair: Secondary | ICD-10-CM | POA: Diagnosis not present

## 2022-07-24 DIAGNOSIS — R6521 Severe sepsis with septic shock: Secondary | ICD-10-CM | POA: Diagnosis not present

## 2022-07-24 DIAGNOSIS — I5033 Acute on chronic diastolic (congestive) heart failure: Secondary | ICD-10-CM | POA: Diagnosis present

## 2022-07-24 DIAGNOSIS — R600 Localized edema: Secondary | ICD-10-CM | POA: Diagnosis not present

## 2022-07-24 DIAGNOSIS — S728X2A Other fracture of left femur, initial encounter for closed fracture: Secondary | ICD-10-CM | POA: Diagnosis present

## 2022-07-24 DIAGNOSIS — E1165 Type 2 diabetes mellitus with hyperglycemia: Secondary | ICD-10-CM | POA: Diagnosis present

## 2022-07-24 DIAGNOSIS — Z66 Do not resuscitate: Secondary | ICD-10-CM | POA: Diagnosis present

## 2022-07-24 DIAGNOSIS — F79 Unspecified intellectual disabilities: Secondary | ICD-10-CM | POA: Diagnosis present

## 2022-07-24 DIAGNOSIS — S72444A Nondisplaced fracture of lower epiphysis (separation) of right femur, initial encounter for closed fracture: Secondary | ICD-10-CM

## 2022-07-24 DIAGNOSIS — A419 Sepsis, unspecified organism: Secondary | ICD-10-CM | POA: Diagnosis not present

## 2022-07-24 DIAGNOSIS — Z7401 Bed confinement status: Secondary | ICD-10-CM | POA: Diagnosis not present

## 2022-07-24 DIAGNOSIS — E1140 Type 2 diabetes mellitus with diabetic neuropathy, unspecified: Secondary | ICD-10-CM | POA: Diagnosis present

## 2022-07-24 DIAGNOSIS — F09 Unspecified mental disorder due to known physiological condition: Secondary | ICD-10-CM | POA: Diagnosis present

## 2022-07-24 DIAGNOSIS — I2489 Other forms of acute ischemic heart disease: Secondary | ICD-10-CM | POA: Diagnosis present

## 2022-07-24 DIAGNOSIS — Z7984 Long term (current) use of oral hypoglycemic drugs: Secondary | ICD-10-CM | POA: Diagnosis not present

## 2022-07-24 DIAGNOSIS — D696 Thrombocytopenia, unspecified: Secondary | ICD-10-CM | POA: Diagnosis present

## 2022-07-24 DIAGNOSIS — I509 Heart failure, unspecified: Secondary | ICD-10-CM | POA: Diagnosis present

## 2022-07-24 DIAGNOSIS — Z7189 Other specified counseling: Secondary | ICD-10-CM | POA: Diagnosis not present

## 2022-07-24 DIAGNOSIS — J9601 Acute respiratory failure with hypoxia: Secondary | ICD-10-CM | POA: Diagnosis present

## 2022-07-24 DIAGNOSIS — Z515 Encounter for palliative care: Secondary | ICD-10-CM | POA: Diagnosis not present

## 2022-07-24 DIAGNOSIS — J690 Pneumonitis due to inhalation of food and vomit: Secondary | ICD-10-CM | POA: Diagnosis not present

## 2022-07-24 DIAGNOSIS — R627 Adult failure to thrive: Secondary | ICD-10-CM | POA: Diagnosis present

## 2022-07-24 DIAGNOSIS — G40909 Epilepsy, unspecified, not intractable, without status epilepticus: Secondary | ICD-10-CM | POA: Diagnosis present

## 2022-07-24 DIAGNOSIS — D649 Anemia, unspecified: Secondary | ICD-10-CM | POA: Diagnosis present

## 2022-07-24 DIAGNOSIS — S7292XA Unspecified fracture of left femur, initial encounter for closed fracture: Secondary | ICD-10-CM | POA: Diagnosis not present

## 2022-07-24 DIAGNOSIS — E876 Hypokalemia: Secondary | ICD-10-CM | POA: Diagnosis present

## 2022-07-24 DIAGNOSIS — W19XXXA Unspecified fall, initial encounter: Secondary | ICD-10-CM | POA: Diagnosis present

## 2022-07-24 DIAGNOSIS — J189 Pneumonia, unspecified organism: Secondary | ICD-10-CM | POA: Diagnosis not present

## 2022-07-24 DIAGNOSIS — F32A Depression, unspecified: Secondary | ICD-10-CM | POA: Diagnosis present

## 2022-07-24 DIAGNOSIS — I110 Hypertensive heart disease with heart failure: Secondary | ICD-10-CM | POA: Diagnosis present

## 2022-07-24 DIAGNOSIS — S72452A Displaced supracondylar fracture without intracondylar extension of lower end of left femur, initial encounter for closed fracture: Secondary | ICD-10-CM | POA: Diagnosis present

## 2022-07-24 LAB — CBC
HCT: 27.3 % — ABNORMAL LOW (ref 39.0–52.0)
Hemoglobin: 8.9 g/dL — ABNORMAL LOW (ref 13.0–17.0)
MCH: 30.4 pg (ref 26.0–34.0)
MCHC: 32.6 g/dL (ref 30.0–36.0)
MCV: 93.2 fL (ref 80.0–100.0)
Platelets: 153 10*3/uL (ref 150–400)
RBC: 2.93 MIL/uL — ABNORMAL LOW (ref 4.22–5.81)
RDW: 14.2 % (ref 11.5–15.5)
WBC: 5.9 10*3/uL (ref 4.0–10.5)
nRBC: 0 % (ref 0.0–0.2)

## 2022-07-24 LAB — ECHOCARDIOGRAM COMPLETE
AR max vel: 2.83 cm2
AV Peak grad: 6 mmHg
Ao pk vel: 1.22 m/s
Area-P 1/2: 6.32 cm2
S' Lateral: 3 cm
Single Plane A4C EF: 61.8 %
Weight: 2190.49 oz

## 2022-07-24 LAB — FOLATE: Folate: 18.1 ng/mL (ref >5.9)

## 2022-07-24 LAB — COMPREHENSIVE METABOLIC PANEL
ALT: 12 U/L (ref 0–44)
AST: 13 U/L — ABNORMAL LOW (ref 15–41)
Albumin: 2.5 g/dL — ABNORMAL LOW (ref 3.5–5.0)
Alkaline Phosphatase: 41 U/L (ref 38–126)
Anion gap: 10 (ref 5–15)
BUN: 13 mg/dL (ref 6–20)
CO2: 30 mmol/L (ref 22–32)
Calcium: 8.3 mg/dL — ABNORMAL LOW (ref 8.9–10.3)
Chloride: 96 mmol/L — ABNORMAL LOW (ref 98–111)
Creatinine, Ser: 0.54 mg/dL — ABNORMAL LOW (ref 0.61–1.24)
GFR, Estimated: >60 mL/min (ref >60)
Glucose, Bld: 159 mg/dL — ABNORMAL HIGH (ref 70–99)
Potassium: 3.1 mmol/L — ABNORMAL LOW (ref 3.5–5.1)
Sodium: 136 mmol/L (ref 135–145)
Total Bilirubin: 0.8 mg/dL (ref 0.3–1.2)
Total Protein: 5.6 g/dL — ABNORMAL LOW (ref 6.5–8.1)

## 2022-07-24 LAB — HIV ANTIBODY (ROUTINE TESTING W REFLEX): HIV Screen 4th Generation wRfx: NONREACTIVE

## 2022-07-24 LAB — MAGNESIUM: Magnesium: 2.1 mg/dL (ref 1.7–2.4)

## 2022-07-24 LAB — RETICULOCYTES
Immature Retic Fract: 26.1 % — ABNORMAL HIGH (ref 2.3–15.9)
RBC.: 2.77 MIL/uL — ABNORMAL LOW (ref 4.22–5.81)
Retic Count, Absolute: 94.7 10*3/uL (ref 19.0–186.0)
Retic Ct Pct: 3.4 % — ABNORMAL HIGH (ref 0.4–3.1)

## 2022-07-24 LAB — IRON AND TIBC
Iron: 17 ug/dL — ABNORMAL LOW (ref 45–182)
Saturation Ratios: 7 % — ABNORMAL LOW (ref 17.9–39.5)
TIBC: 237 ug/dL — ABNORMAL LOW (ref 250–450)
UIBC: 220 ug/dL

## 2022-07-24 LAB — GLUCOSE, CAPILLARY
Glucose-Capillary: 157 mg/dL — ABNORMAL HIGH (ref 70–99)
Glucose-Capillary: 146 mg/dL — ABNORMAL HIGH (ref 70–99)
Glucose-Capillary: 150 mg/dL — ABNORMAL HIGH (ref 70–99)
Glucose-Capillary: 144 mg/dL — ABNORMAL HIGH (ref 70–99)
Glucose-Capillary: 237 mg/dL — ABNORMAL HIGH (ref 70–99)
Glucose-Capillary: 180 mg/dL — ABNORMAL HIGH (ref 70–99)

## 2022-07-24 LAB — VALPROIC ACID LEVEL: Valproic Acid Lvl: 94 ug/mL (ref 50.0–100.0)

## 2022-07-24 LAB — FERRITIN: Ferritin: 381 ng/mL — ABNORMAL HIGH (ref 24–336)

## 2022-07-24 MED ORDER — LACTATED RINGERS IV SOLN: INTRAVENOUS | Status: AC

## 2022-07-24 MED ORDER — PRAVASTATIN SODIUM 20 MG PO TABS
20.0000 mg | ORAL_TABLET | Freq: Every day | ORAL | Status: DC
  Administered 2022-07-24 – 2022-07-25 (×2): 20 mg via ORAL
  Filled 2022-07-24 (×2): qty 1

## 2022-07-24 MED ORDER — ACETAMINOPHEN 650 MG RE SUPP
650.0000 mg | Freq: Four times a day (QID) | RECTAL | Status: DC | PRN
  Administered 2022-07-25: 650 mg via RECTAL
  Filled 2022-07-24: qty 1

## 2022-07-24 MED ORDER — DIVALPROEX SODIUM 500 MG PO DR TAB: 1000.0000 mg | DELAYED_RELEASE_TABLET | Freq: Two times a day (BID) | ORAL | Status: DC

## 2022-07-24 MED ORDER — PERFLUTREN LIPID MICROSPHERE
1.0000 mL | INTRAVENOUS | Status: AC | PRN
  Administered 2022-07-24: 4 mL via INTRAVENOUS

## 2022-07-24 MED ORDER — HYDROCODONE-ACETAMINOPHEN 5-325 MG PO TABS
1.0000 | ORAL_TABLET | ORAL | Status: DC | PRN
  Filled 2022-07-24: qty 1

## 2022-07-24 MED ORDER — SERTRALINE HCL 50 MG PO TABS
50.0000 mg | ORAL_TABLET | Freq: Every day | ORAL | Status: DC
  Administered 2022-07-24 – 2022-07-25 (×2): 50 mg via ORAL
  Filled 2022-07-24 (×2): qty 1

## 2022-07-24 MED ORDER — IPRATROPIUM BROMIDE 0.02 % IN SOLN: 0.5000 mg | Freq: Four times a day (QID) | RESPIRATORY_TRACT | Status: DC | PRN

## 2022-07-24 MED ORDER — PALIPERIDONE ER 3 MG PO TB24
3.0000 mg | ORAL_TABLET | ORAL | Status: DC
  Administered 2022-07-24 – 2022-07-25 (×2): 3 mg via ORAL
  Filled 2022-07-24 (×3): qty 1

## 2022-07-24 MED ORDER — MORPHINE SULFATE (PF) 2 MG/ML IV SOLN
2.0000 mg | INTRAVENOUS | Status: DC | PRN
  Administered 2022-07-24 – 2022-07-28 (×5): 2 mg via INTRAVENOUS
  Filled 2022-07-24 (×5): qty 1

## 2022-07-24 MED ORDER — DIVALPROEX SODIUM 500 MG PO DR TAB
1000.0000 mg | DELAYED_RELEASE_TABLET | Freq: Two times a day (BID) | ORAL | Status: DC
  Administered 2022-07-24 – 2022-07-25 (×3): 1000 mg via ORAL
  Filled 2022-07-24 (×3): qty 2

## 2022-07-24 MED ORDER — INSULIN ASPART 100 UNIT/ML IJ SOLN
0.0000 [IU] | Freq: Three times a day (TID) | INTRAMUSCULAR | Status: DC
  Administered 2022-07-25 (×2): 1 [IU] via SUBCUTANEOUS
  Filled 2022-07-24 (×3): qty 1

## 2022-07-24 MED ORDER — ACETAMINOPHEN 325 MG PO TABS: 650.0000 mg | ORAL_TABLET | Freq: Four times a day (QID) | ORAL | Status: DC | PRN

## 2022-07-24 MED ORDER — SODIUM CHLORIDE 0.9% FLUSH
3.0000 mL | Freq: Two times a day (BID) | INTRAVENOUS | Status: DC
  Administered 2022-07-24 – 2022-07-29 (×9): 3 mL via INTRAVENOUS

## 2022-07-24 MED ORDER — BACLOFEN 10 MG PO TABS
10.0000 mg | ORAL_TABLET | Freq: Three times a day (TID) | ORAL | Status: DC
  Administered 2022-07-24 – 2022-07-25 (×6): 10 mg via ORAL
  Filled 2022-07-24 (×7): qty 1

## 2022-07-24 MED ORDER — POTASSIUM CHLORIDE CRYS ER 20 MEQ PO TBCR
20.0000 meq | EXTENDED_RELEASE_TABLET | ORAL | Status: AC
  Administered 2022-07-24 (×3): 20 meq via ORAL
  Filled 2022-07-24 (×3): qty 1

## 2022-07-24 MED ORDER — HEPARIN SODIUM (PORCINE) 5000 UNIT/ML IJ SOLN
5000.0000 [IU] | Freq: Three times a day (TID) | INTRAMUSCULAR | Status: DC
  Administered 2022-07-24 – 2022-07-25 (×6): 5000 [IU] via SUBCUTANEOUS
  Filled 2022-07-24 (×6): qty 1

## 2022-07-24 NOTE — Assessment & Plan Note (Signed)
Pt on metformin. We will hold. Start SSI.

## 2022-07-24 NOTE — Plan of Care (Signed)
  Problem: Clinical Measurements: Goal: Diagnostic test results will improve Outcome: Progressing   Problem: Nutrition: Goal: Adequate nutrition will be maintained Outcome: Progressing   Problem: Pain Managment: Goal: General experience of comfort will improve Outcome: Progressing   

## 2022-07-24 NOTE — Assessment & Plan Note (Addendum)
No diagnosis of CHF. ? If this is dependant Edema. We will monitor strict I/O. Pt was given diuretic in Ed.  Cont diet from what pt has at facility with restriction and or recommendation.    Marland Kitchen

## 2022-07-24 NOTE — Progress Notes (Signed)
  Echocardiogram 2D Echocardiogram has been performed. Definity IV ultrasound imaging agent used on this study.  Daniel Mitchell 07/24/2022, 8:34 AM

## 2022-07-24 NOTE — Assessment & Plan Note (Addendum)
We will place pt on seizure/ aspiration  precaution. Continue depakote and check level.

## 2022-07-24 NOTE — Assessment & Plan Note (Signed)
Comminuted, mildly displaced and angulated distal left femoral fracture with probable intra-articular extension. Associated joint effusion.Apparent old healed distal femoral fracture with deformity, degenerative changes and moderate joint effusion. No definite acute bony abnormality. Orthopedic consult per am team.

## 2022-07-24 NOTE — Assessment & Plan Note (Signed)
Pt lives at [facility]. Mom is involved in his care.

## 2022-07-24 NOTE — Progress Notes (Signed)
  Progress Note   Patient: Daniel Mitchell [id number] DOB: 1965/12/11 DOA: 07/23/2022     0 DOS: the patient was seen and examined on 07/24/2022   Brief hospital course:  57 y/o with intellectual  disability who presented from his facility on account of worsening lower extremity edema. Incidentally found to have distal femoral fracture.HB was noted to 8.7.CT head was non revealing.ECHO shows preserved LVEF   Assessment and Plan:  Left distal femoral fracture. Its unclear what his baseline activity level is. Bilateral knee swelling, unclear if hemarthrosis related to trauma. Orthopedics were consulted via Epic  and amion to eval for possible ORIF. Old Right femoral fracture also noted.  Bilateral lower extremity edema: No known history of CHF.ECHO ordered and pending. Duplex lower extremity were negative for DVT.   Acute Urinary retention:  Foley catheter was placed  Acute anemia from baseline of 13 in 2023. HB on this admission was~ 8.7. No indication for PRBC transfusion at this time. FOBT negative.anemia workup ordered.  Unspecified convulsions (HCC)-On seizure/ aspiration  precaution.Continue depakote and check level.   Type II diabetes mellitus ([place]): Hold metformin. FSG monitoring per protocol  SSI for optimization of blood glucose.   Hypokalemia: Potassium replacement therapy ordered  Cognitive disorder-baseline. Pt lives at [facility].Mom is involved in his care.   Sinus Tachycardia:  Less likely PE, could likely be due to discomfort from knee injury. Consider CTA chest if tachycardia persists.Dupex were negative  Subjective: Patient is a poor historian.Unable to give any appropriate history. Patient was able to tolerate taking his medication. Family requested for mechanical soft diet with thickened liquids  Physical Exam: Vitals:   07/24/22 0135 07/24/22 0500 07/24/22 0943 07/24/22 1125  BP: (!) 142/85  104/73 114/77  Pulse: (!) 119  (!) 108 (!) 115  Resp:  18  19 19   Temp: 99.2 F (37.3 C)  98.8 F (37.1 C) 98.5 F (36.9 C)  SpO2: 96%  96% 94%  Weight:  62.1 kg     Patient was laying comfortably in bed.  Not in any acute distress. He is arousable but does not answer to questions asked.Patient is non communicative. Uncooperative to exam HEENT: Oral mucosa moist.  Nasal cannula in situ Chest: Clinically diminished bilaterally.  No added sounds appreciated. Cardiovascular:  Tachycardic. Abdomen: Soft nontender. Extremities: pedal edema+.bilateral knee welling. Unable to assess ROM at this time CNS shows no focal deficits.   Data Reviewed: Sodium-132 Potassium-3.6 Cholride-97 BUN 13 Cr-0.51 HB 8.7 HCT- 27 PLT-144 Glucose-185  EKG : sinus tachycardia  Family Communication: Family were at bedside  Disposition: Status is: Observation The patient will require care spanning > 2 midnights and should be moved to inpatient because: newly diagnosed femoral fracture  Planned Discharge Destination: Skilled nursing facility VTE prophylaxis with SCD's and Heparin Time spent: 35 minutes  Author: Artist Beach, MD 07/24/2022 12:07 PM  For on call review [email or web address].

## 2022-07-24 NOTE — Progress Notes (Signed)
   07/24/22 1125  Assess: MEWS Score  Temp 98.5 F (36.9 C)  BP 114/77  MAP (mmHg) 89  Pulse Rate (!) 115  Resp 19  SpO2 94 %  O2 Device Room Air  Assess: MEWS Score  MEWS Temp 0  MEWS Systolic 0  MEWS Pulse 2  MEWS RR 0  MEWS LOC 0  MEWS Score 2  MEWS Score Color Yellow  Assess: if the MEWS score is Yellow or Red  Were vital signs taken at a resting state? Yes  Focused Assessment No change from prior assessment  Does the patient meet 2 or more of the SIRS criteria? No  MEWS guidelines implemented  Yes, yellow  Treat  MEWS Interventions Considered administering scheduled or prn medications/treatments as ordered  Take Vital Signs  Increase Vital Sign Frequency  Yellow: Q2hr x1, continue Q4hrs until patient remains green for 12hrs  Escalate  MEWS: Escalate Yellow: Discuss with charge nurse and consider notifying provider and/or RRT  Notify: Charge Nurse/RN  Name of Charge Nurse/RN Notified John, RN  Assess: SIRS CRITERIA  SIRS Temperature  0  SIRS Pulse 1  SIRS Respirations  0  SIRS WBC 0  SIRS Score Sum  1

## 2022-07-24 NOTE — Assessment & Plan Note (Addendum)
LE edema. D/d include dependant edema , DVT , Fracture.  We will do LE venous Doppler. Pt has previously fractured his right leg and today comes with leg swelling and found to have left leg fracture.  Orthopedic consult per Am team . Suspect 2/2 to pt's fall on Wednesday.  Pt has no know history of heart failure.

## 2022-07-24 NOTE — Assessment & Plan Note (Signed)
Glycemic protocol. Hold metformin.    

## 2022-07-25 ENCOUNTER — Inpatient Hospital Stay: Payer: Medicare Other

## 2022-07-25 ENCOUNTER — Encounter: Payer: Self-pay | Admitting: Internal Medicine

## 2022-07-25 DIAGNOSIS — S728X2A Other fracture of left femur, initial encounter for closed fracture: Secondary | ICD-10-CM

## 2022-07-25 DIAGNOSIS — J9601 Acute respiratory failure with hypoxia: Secondary | ICD-10-CM

## 2022-07-25 LAB — GLUCOSE, CAPILLARY
Glucose-Capillary: 162 mg/dL — ABNORMAL HIGH (ref 70–99)
Glucose-Capillary: 159 mg/dL — ABNORMAL HIGH (ref 70–99)
Glucose-Capillary: 186 mg/dL — ABNORMAL HIGH (ref 70–99)
Glucose-Capillary: 196 mg/dL — ABNORMAL HIGH (ref 70–99)
Glucose-Capillary: 175 mg/dL — ABNORMAL HIGH (ref 70–99)
Glucose-Capillary: 164 mg/dL — ABNORMAL HIGH (ref 70–99)

## 2022-07-25 LAB — BASIC METABOLIC PANEL
Anion gap: 8 (ref 5–15)
BUN: 19 mg/dL (ref 6–20)
CO2: 29 mmol/L (ref 22–32)
Calcium: 8.1 mg/dL — ABNORMAL LOW (ref 8.9–10.3)
Chloride: 99 mmol/L (ref 98–111)
Creatinine, Ser: 0.58 mg/dL — ABNORMAL LOW (ref 0.61–1.24)
GFR, Estimated: >60 mL/min (ref >60)
Glucose, Bld: 165 mg/dL — ABNORMAL HIGH (ref 70–99)
Potassium: 3.8 mmol/L (ref 3.5–5.1)
Sodium: 136 mmol/L (ref 135–145)

## 2022-07-25 LAB — CBC WITH DIFFERENTIAL/PLATELET
Abs Immature Granulocytes: 0.04 10*3/uL (ref 0.00–0.07)
Abs Immature Granulocytes: 0.03 10*3/uL (ref 0.00–0.07)
Basophils Absolute: 0 10*3/uL (ref 0.0–0.1)
Basophils Absolute: 0 10*3/uL (ref 0.0–0.1)
Basophils Relative: 0 %
Basophils Relative: 0 %
Eosinophils Absolute: 0 10*3/uL (ref 0.0–0.5)
Eosinophils Absolute: 0 10*3/uL (ref 0.0–0.5)
Eosinophils Relative: 0 %
Eosinophils Relative: 0 %
HCT: 24.9 % — ABNORMAL LOW (ref 39.0–52.0)
HCT: 27.5 % — ABNORMAL LOW (ref 39.0–52.0)
Hemoglobin: 8.1 g/dL — ABNORMAL LOW (ref 13.0–17.0)
Hemoglobin: 8.6 g/dL — ABNORMAL LOW (ref 13.0–17.0)
Immature Granulocytes: 1 %
Immature Granulocytes: 1 %
Lymphocytes Relative: 21 %
Lymphocytes Relative: 14 %
Lymphs Abs: 1.3 10*3/uL (ref 0.7–4.0)
Lymphs Abs: 0.6 10*3/uL — ABNORMAL LOW (ref 0.7–4.0)
MCH: 30.9 pg (ref 26.0–34.0)
MCH: 30.3 pg (ref 26.0–34.0)
MCHC: 32.5 g/dL (ref 30.0–36.0)
MCHC: 31.3 g/dL (ref 30.0–36.0)
MCV: 95 fL (ref 80.0–100.0)
MCV: 96.8 fL (ref 80.0–100.0)
Monocytes Absolute: 1 10*3/uL (ref 0.1–1.0)
Monocytes Absolute: 0.6 10*3/uL (ref 0.1–1.0)
Monocytes Relative: 16 %
Monocytes Relative: 15 %
Neutro Abs: 3.9 10*3/uL (ref 1.7–7.7)
Neutro Abs: 2.8 10*3/uL (ref 1.7–7.7)
Neutrophils Relative %: 62 %
Neutrophils Relative %: 70 %
Platelets: 165 10*3/uL (ref 150–400)
Platelets: 177 10*3/uL (ref 150–400)
RBC: 2.62 MIL/uL — ABNORMAL LOW (ref 4.22–5.81)
RBC: 2.84 MIL/uL — ABNORMAL LOW (ref 4.22–5.81)
RDW: 14.4 % (ref 11.5–15.5)
RDW: 14.6 % (ref 11.5–15.5)
WBC: 6.4 10*3/uL (ref 4.0–10.5)
WBC: 4 10*3/uL (ref 4.0–10.5)
nRBC: 0 % (ref 0.0–0.2)
nRBC: 0 % (ref 0.0–0.2)

## 2022-07-25 LAB — BLOOD GAS, ARTERIAL
Acid-Base Excess: 8.3 mmol/L — ABNORMAL HIGH (ref 0.0–2.0)
Bicarbonate: 31.8 mmol/L — ABNORMAL HIGH (ref 20.0–28.0)
O2 Saturation: 89.6 %
Patient temperature: 37
pCO2 arterial: 39 mmHg (ref 32–48)
pH, Arterial: 7.52 — ABNORMAL HIGH (ref 7.35–7.45)
pO2, Arterial: 58 mmHg — ABNORMAL LOW (ref 83–108)

## 2022-07-25 LAB — COMPREHENSIVE METABOLIC PANEL
ALT: 19 U/L (ref 0–44)
AST: 25 U/L (ref 15–41)
Albumin: 2.2 g/dL — ABNORMAL LOW (ref 3.5–5.0)
Alkaline Phosphatase: 69 U/L (ref 38–126)
Anion gap: 12 (ref 5–15)
BUN: 28 mg/dL — ABNORMAL HIGH (ref 6–20)
CO2: 28 mmol/L (ref 22–32)
Calcium: 8.5 mg/dL — ABNORMAL LOW (ref 8.9–10.3)
Chloride: 98 mmol/L (ref 98–111)
Creatinine, Ser: 1 mg/dL (ref 0.61–1.24)
GFR, Estimated: >60 mL/min (ref >60)
Glucose, Bld: 240 mg/dL — ABNORMAL HIGH (ref 70–99)
Potassium: 4 mmol/L (ref 3.5–5.1)
Sodium: 138 mmol/L (ref 135–145)
Total Bilirubin: 0.6 mg/dL (ref 0.3–1.2)
Total Protein: 5.6 g/dL — ABNORMAL LOW (ref 6.5–8.1)

## 2022-07-25 LAB — URINALYSIS, COMPLETE (UACMP) WITH MICROSCOPIC
Bacteria, UA: NONE SEEN
Bilirubin Urine: NEGATIVE
Glucose, UA: >=500 mg/dL — AB
Hgb urine dipstick: NEGATIVE
Ketones, ur: 20 mg/dL — AB
Nitrite: NEGATIVE
Protein, ur: NEGATIVE mg/dL
Specific Gravity, Urine: 1.022 (ref 1.005–1.030)
pH: 5 (ref 5.0–8.0)

## 2022-07-25 LAB — HEMOGLOBIN A1C
Hgb A1c MFr Bld: 5.8 % — ABNORMAL HIGH (ref 4.8–5.6)
Mean Plasma Glucose: 120 mg/dL

## 2022-07-25 LAB — VITAMIN B12: Vitamin B-12: 191 pg/mL (ref 180–914)

## 2022-07-25 LAB — LACTIC ACID, PLASMA
Lactic Acid, Venous: 3.3 mmol/L (ref 0.5–1.9)
Lactic Acid, Venous: 2 mmol/L (ref 0.5–1.9)

## 2022-07-25 LAB — PROCALCITONIN: Procalcitonin: 0.64 ng/mL

## 2022-07-25 MED ORDER — LORAZEPAM 2 MG/ML IJ SOLN: 1.0000 mg | Freq: Once | INTRAMUSCULAR | Status: DC

## 2022-07-25 MED ORDER — KETOROLAC TROMETHAMINE 15 MG/ML IJ SOLN
15.0000 mg | Freq: Once | INTRAMUSCULAR | Status: AC
  Administered 2022-07-25: 15 mg via INTRAVENOUS
  Filled 2022-07-25: qty 1

## 2022-07-25 MED ORDER — LACTATED RINGERS IV BOLUS
500.0000 mL | Freq: Once | INTRAVENOUS | Status: AC
  Administered 2022-07-25: 500 mL via INTRAVENOUS

## 2022-07-25 MED ORDER — FUROSEMIDE 10 MG/ML IJ SOLN
40.0000 mg | Freq: Two times a day (BID) | INTRAMUSCULAR | Status: DC
  Administered 2022-07-25: 40 mg via INTRAVENOUS
  Filled 2022-07-25 (×2): qty 4

## 2022-07-25 MED ORDER — SODIUM CHLORIDE 0.9 % IV BOLUS: 500.0000 mL | Freq: Once | INTRAVENOUS | Status: DC

## 2022-07-25 MED ORDER — SERTRALINE HCL 50 MG PO TABS: 100.0000 mg | ORAL_TABLET | Freq: Every day | ORAL | Status: DC

## 2022-07-25 MED ORDER — DIVALPROEX SODIUM 500 MG PO DR TAB
750.0000 mg | DELAYED_RELEASE_TABLET | Freq: Two times a day (BID) | ORAL | Status: DC
  Administered 2022-07-25: 750 mg via ORAL
  Filled 2022-07-25 (×2): qty 1

## 2022-07-25 MED ORDER — VANCOMYCIN HCL 1500 MG/300ML IV SOLN
1500.0000 mg | INTRAVENOUS | Status: DC
  Administered 2022-07-25: 1500 mg via INTRAVENOUS
  Filled 2022-07-25: qty 300

## 2022-07-25 MED ORDER — POTASSIUM CHLORIDE 20 MEQ PO PACK
40.0000 meq | PACK | Freq: Every day | ORAL | Status: DC
  Filled 2022-07-25: qty 2

## 2022-07-25 MED ORDER — SODIUM CHLORIDE 0.9 % IV SOLN
2.0000 g | Freq: Three times a day (TID) | INTRAVENOUS | Status: DC
  Administered 2022-07-25 – 2022-07-26 (×2): 2 g via INTRAVENOUS
  Filled 2022-07-25: qty 12.5
  Filled 2022-07-25 (×2): qty 2

## 2022-07-25 MED ORDER — IOHEXOL 350 MG/ML SOLN
75.0000 mL | Freq: Once | INTRAVENOUS | Status: AC | PRN
  Administered 2022-07-25: 75 mL via INTRAVENOUS

## 2022-07-25 MED ORDER — ENOXAPARIN SODIUM 80 MG/0.8ML IJ SOSY
1.0000 mg/kg | PREFILLED_SYRINGE | Freq: Two times a day (BID) | INTRAMUSCULAR | Status: DC
  Administered 2022-07-25: 62.5 mg via SUBCUTANEOUS
  Filled 2022-07-25: qty 0.63

## 2022-07-25 MED ORDER — POTASSIUM CHLORIDE CRYS ER 20 MEQ PO TBCR
40.0000 meq | EXTENDED_RELEASE_TABLET | Freq: Every day | ORAL | Status: DC
  Administered 2022-07-25: 40 meq via ORAL
  Filled 2022-07-25: qty 2

## 2022-07-25 NOTE — Progress Notes (Addendum)
Progress Note    Daniel Mitchell  [id number] DOB: 1965-08-20  DOA: 07/23/2022 PCP: Kirk Ruths, MD      Brief Narrative:    Medical records reviewed and are as summarized below:  Daniel Mitchell is a 57 y.o. 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  He was brought to the emergency department on 07/23/2022 because of a fall at the group home that reportedly occurred on 07/20/2022.     Assessment/Plan:   Principal Problem:   CHF (congestive heart failure) Active Problems:   Other fracture of left femur, initial encounter for closed fracture   Edema of both legs   Cognitive disorder   Controlled type 2 diabetes mellitus with complication, without long-term current use of insulin   Type II diabetes mellitus   Unspecified convulsions    Body mass index is 24.6 kg/m.  Left distal femoral fracture: Consulted Dr. Devona Konig, orthopedic surgeon, to assist with management.  Analgesics as needed for pain.   Bilateral lower extremity edema: 2D echo showed EF estimated at 65 to [id number], grade 1 diastolic dysfunction.  He is on as needed Lasix at the group home.  Start IV Lasix for suspected exacerbation of chronic diastolic CHF and monitor for response.   Acute anemia: Etiology unclear.  H&H is stable.  No indication for blood transfusion at this time. Low normal vitamin B12 (191): Start vitamin B12 supplement   Hypokalemia: Improved.  Continue potassium repletion because of IV diuresis   Type II DM with hyperglycemia: NovoLog as needed for hyperglycemia.   ?  Seizure disorder: Continue Depakote   Intellectual and developmental disability: Continue supportive care  Other comorbidities include hypertension, depression,  ataxia/demyelinating motor neuropathy, dysphagia    Diet Order             DIET - DYS 1 Room service appropriate? Yes; Fluid consistency: Nectar Thick  Diet effective now                            Consultants: Orthopedic surgeon  Procedures: None    Medications:    baclofen  10 mg Oral TID   divalproex  1,000 mg Oral BID   heparin  5,000 Units Subcutaneous Q8H   insulin aspart  0-6 Units Subcutaneous TID WC   paliperidone  3 mg Oral BH-q7a   pravastatin  20 mg Oral Daily   sertraline  50 mg Oral Daily   sodium chloride flush  3 mL Intravenous Q12H   Continuous Infusions:   Anti-infectives (From admission, onward)    None              Family Communication/Anticipated D/C date and plan/Code Status   DVT prophylaxis: heparin injection 5,000 Units Start: 07/24/22 0015     Code Status: Full Code  Family Communication: Plan discussed with his mother at the bedside Disposition Plan: Plan to the charge to group home in 3 to 4 days   Status is: Inpatient Remains inpatient appropriate because: Left distal femur fracture       Subjective:   Interval events noted.  He is unable to provide any history.  His mother was at the bedside.  Objective:    Vitals:   07/24/22 1733 07/24/22 2136 07/25/22 0205 07/25/22 0813  BP: 127/84 132/75  120/75  Pulse: (!) 115 (!) 119  (!) 122  Resp: 16 16  16   Temp: 99.2 F (37.3 C) 99.9 F (37.7 C)  98.4 F (36.9 C)  TempSrc:    Oral  SpO2: 95% 93%  92%  Weight:   63 kg    No data found.   Intake/Output Summary (Last 24 hours) at 07/25/2022 1013 Last data filed at 07/25/2022 0913 Gross per 24 hour  Intake 246.5 ml  Output 150 ml  Net 96.5 ml   Filed Weights   07/24/22 0500 07/25/22 0205  Weight: 62.1 kg 63 kg    Exam:   GEN: NAD SKIN: Warm and dry EYES: No pallor or icterus ENT: MMM CV: RRR PULM: CTA B ABD: soft, ND, NT, +BS CNS: Alert, unable to assess, he does not follow  commands.  He does not move lower extremities. EXT: Significant B/l leg edema. Left thigh edema       Data Reviewed:   I have personally reviewed following labs and imaging studies:  Labs: Labs show the following:   Basic Metabolic Panel: Recent Labs  Lab 07/23/22 2156 07/24/22 0443 07/25/22 0442  NA 132* 136 136  K 3.6 3.1* 3.8  CL 97* 96* 99  CO2 25 30 29   GLUCOSE 185* 159* 165*  BUN 13 13 19   CREATININE 0.51* 0.54* 0.58*  CALCIUM 8.3* 8.3* 8.1*  MG 2.1  --   --    GFR CrCl cannot be calculated (Unknown ideal weight.). Liver Function Tests: Recent Labs  Lab 07/23/22 2156 07/24/22 0443  AST 21 13*  ALT 14 12  ALKPHOS 47 41  BILITOT 0.8 0.8  PROT 5.7* 5.6*  ALBUMIN 2.5* 2.5*   No results for input(s): "LIPASE", "AMYLASE" in the last 168 hours. No results for input(s): "AMMONIA" in the last 168 hours. Coagulation profile No results for input(s): "INR", "PROTIME" in the last 168 hours.  CBC: Recent Labs  Lab 07/23/22 2156 07/24/22 0443 07/25/22 0442  WBC 6.0 5.9 6.4  NEUTROABS  --   --  3.9  HGB 8.7* 8.9* 8.1*  HCT 27.0* 27.3* 24.9*  MCV 95.7 93.2 95.0  PLT 144* 153 165   Cardiac Enzymes: No results for input(s): "CKTOTAL", "CKMB", "CKMBINDEX", "TROPONINI" in the last 168 hours. BNP (last 3 results) No results for input(s): "PROBNP" in the last 8760 hours. CBG: Recent Labs  Lab 07/24/22 1639 07/24/22 1947 07/24/22 2309 07/25/22 0406 07/25/22 0802  GLUCAP 144* 237* 180* 162* 159*   D-Dimer: No results for input(s): "DDIMER" in the last 72 hours. Hgb A1c: Recent Labs    07/24/22 0443  HGBA1C 5.8*   Lipid Profile: No results for input(s): "CHOL", "HDL", "LDLCALC", "TRIG", "CHOLHDL", "LDLDIRECT" in the last 72 hours. Thyroid function studies: No results for input(s): "TSH", "T4TOTAL", "T3FREE", "THYROIDAB" in the last 72 hours.  Invalid input(s): "FREET3" Anemia work up: Recent Labs    07/24/22 0443 07/24/22 1435  FOLATE 18.1   --   FERRITIN 381*  --   TIBC 237*  --   IRON 17*  --   RETICCTPCT  --  3.4*   Sepsis Labs: Recent Labs  Lab 07/23/22 2156 07/24/22 0443 07/25/22 0442  WBC 6.0 5.9 6.4    Microbiology No results found for this or any previous visit (from the past 240 hour(s)).  Procedures and diagnostic studies:  ECHOCARDIOGRAM COMPLETE  Result Date: 07/24/2022    ECHOCARDIOGRAM REPORT   Patient Name:   Daniel Mitchell Date of Exam:  07/24/2022 Medical Rec #:  [id number]         Height:       63.0 in Accession #:    [id number]        Weight:       136.9 lb Date of Birth:  29-Jan-1966         BSA:          1.646 m Patient Age:    64 years          BP:           142/85 mmHg Patient Gender: M                 HR:           111 bpm. Exam Location:  [facility] Procedure: 2D Echo and Intracardiac Opacification Agent Indications:     Bilateral Edema  History:         Patient has no prior history of Echocardiogram examinations.  Sonographer:     Kathlen Brunswick RDCS Referring Phys:  [id number] Gretta Cool PATEL Diagnosing Phys: Kate Sable MD  Sonographer Comments: Technically difficult study due to poor echo windows, no subcostal window and suboptimal apical window. Image acquisition challenging due to patient body habitus. IMPRESSIONS  1. Left ventricular ejection fraction, by estimation, is 65 to 70%. The left ventricle has normal function. The left ventricle has no regional wall motion abnormalities. Left ventricular diastolic parameters are consistent with Grade I diastolic dysfunction (impaired relaxation).  2. Right ventricular systolic function is normal. The right ventricular size is normal.  3. The mitral valve is normal in structure. No evidence of mitral valve regurgitation.  4. The aortic valve is tricuspid. Aortic valve regurgitation is not visualized.  5. Aortic dilatation noted. There is mild dilatation of the aortic root, measuring 39 mm. FINDINGS  Left Ventricle: Left ventricular ejection fraction, by  estimation, is 65 to 70%. The left ventricle has normal function. The left ventricle has no regional wall motion abnormalities. Definity contrast agent was given IV to delineate the left ventricular  endocardial borders. The left ventricular internal cavity size was normal in size. There is no left ventricular hypertrophy. Left ventricular diastolic parameters are consistent with Grade I diastolic dysfunction (impaired relaxation). Right Ventricle: The right ventricular size is normal. No increase in right ventricular wall thickness. Right ventricular systolic function is normal. Left Atrium: Left atrial size was normal in size. Right Atrium: Right atrial size was normal in size. Pericardium: There is no evidence of pericardial effusion. Mitral Valve: The mitral valve is normal in structure. No evidence of mitral valve regurgitation. Tricuspid Valve: The tricuspid valve is normal in structure. Tricuspid valve regurgitation is not demonstrated. Aortic Valve: The aortic valve is tricuspid. Aortic valve regurgitation is not visualized. Aortic valve peak gradient measures 6.0 mmHg. Pulmonic Valve: The pulmonic valve was normal in structure. Pulmonic valve regurgitation is not visualized. Aorta: Aortic dilatation noted. There is mild dilatation of the aortic root, measuring 39 mm. Venous: The inferior vena cava was not well visualized. IAS/Shunts: No atrial level shunt detected by color flow Doppler.  LEFT VENTRICLE PLAX 2D LVIDd:         4.20 cm     Diastology LVIDs:         3.00 cm     LV e' medial:    6.53 cm/s LV PW:         1.00 cm     LV E/e' medial:  9.2 LV IVS:  0.90 cm     LV e' lateral:   10.40 cm/s LVOT diam:     2.00 cm     LV E/e' lateral: 5.8 LV SV:         76 LV SV Index:   46 LVOT Area:     3.14 cm  LV Volumes (MOD) LV vol d, MOD A4C: 56.8 ml LV vol s, MOD A4C: 21.7 ml LV SV MOD A4C:     56.8 ml RIGHT VENTRICLE RV Basal diam:  2.60 cm RV S prime:     15.90 cm/s TAPSE (M-mode): 1.7 cm LEFT ATRIUM            Index        RIGHT ATRIUM          Index LA diam:      2.60 cm 1.58 cm/m   RA Area:     6.66 cm LA Vol (A2C): 10.9 ml 6.62 ml/m   RA Volume:   11.40 ml 6.93 ml/m LA Vol (A4C): 16.7 ml 10.15 ml/m  AORTIC VALVE                 PULMONIC VALVE AV Area (Vmax): 2.83 cm     PV Vmax:        1.04 m/s AV Vmax:        122.00 cm/s  PV Peak grad:   4.3 mmHg AV Peak Grad:   6.0 mmHg     RVOT Peak grad: 3 mmHg LVOT Vmax:      110.00 cm/s LVOT Vmean:     77.200 cm/s LVOT VTI:       0.241 m  AORTA Ao Root diam: 3.90 cm Ao Asc diam:  2.90 cm MITRAL VALVE               TRICUSPID VALVE MV Area (PHT): 6.32 cm    TR Peak grad:   23.4 mmHg MV Decel Time: 120 msec    TR Vmax:        242.00 cm/s MV E velocity: 60.30 cm/s MV A velocity: 87.50 cm/s  SHUNTS MV E/A ratio:  0.69        Systemic VTI:  0.24 m                            Systemic Diam: 2.00 cm Kate Sable MD Electronically signed by Kate Sable MD Signature Date/Time: 07/24/2022/1:48:58 PM    Final    US Venous Img Lower Bilateral (DVT)  Result Date: 07/24/2022 CLINICAL DATA:  Bilateral lower extremity pain and edema. EXAM: BILATERAL LOWER EXTREMITY VENOUS DOPPLER ULTRASOUND TECHNIQUE: Gray-scale sonography with graded compression, as well as color Doppler and duplex ultrasound were performed to evaluate the lower extremity deep venous systems from the level of the common femoral vein and including the common femoral, femoral, profunda femoral, popliteal and calf veins including the posterior tibial, peroneal and gastrocnemius veins when visible. The superficial great saphenous vein was also interrogated. Spectral Doppler was utilized to evaluate flow at rest and with distal augmentation maneuvers in the common femoral, femoral and popliteal veins. COMPARISON:  Prior left lower extremity study on 07/09/2021 and bilateral study on 03/19/2016 FINDINGS: RIGHT LOWER EXTREMITY Common Femoral Vein: No evidence of thrombus. Normal compressibility, respiratory  phasicity and response to augmentation. Saphenofemoral Junction: No evidence of thrombus. Normal compressibility and flow on color Doppler imaging. Profunda Femoral Vein: No evidence of thrombus. Normal compressibility and flow on color  Doppler imaging. Femoral Vein: No evidence of thrombus. Normal compressibility, respiratory phasicity and response to augmentation. Popliteal Vein: No evidence of thrombus. Normal compressibility, respiratory phasicity and response to augmentation. Calf Veins: No evidence of thrombus. Normal compressibility and flow on color Doppler imaging. Superficial Great Saphenous Vein: No evidence of thrombus. Normal compressibility. Venous Reflux:  None. Other Findings: No evidence of superficial thrombophlebitis or abnormal fluid collection. LEFT LOWER EXTREMITY Common Femoral Vein: No evidence of thrombus. Normal compressibility, respiratory phasicity and response to augmentation. Saphenofemoral Junction: No evidence of thrombus. Normal compressibility and flow on color Doppler imaging. Profunda Femoral Vein: No evidence of thrombus. Normal compressibility and flow on color Doppler imaging. Femoral Vein: No evidence of thrombus. Normal compressibility, respiratory phasicity and response to augmentation. Popliteal Vein: No evidence of thrombus. Normal compressibility, respiratory phasicity and response to augmentation. Calf Veins: No evidence of thrombus. Normal compressibility and flow on color Doppler imaging. Superficial Great Saphenous Vein: No evidence of thrombus. Normal compressibility. Venous Reflux:  None. Other Findings: No evidence of superficial thrombophlebitis or abnormal fluid collection. IMPRESSION: No evidence of deep venous thrombosis in either lower extremity. Electronically Signed   By: Aletta Edouard M.D.   On: 07/24/2022 09:23   CT HEAD WO CONTRAST (5MM)  Result Date: 07/23/2022 CLINICAL DATA:  [place] on Wednesday, head trauma EXAM: CT HEAD WITHOUT CONTRAST TECHNIQUE:  Contiguous axial images were obtained from the base of the skull through the vertex without intravenous contrast. RADIATION DOSE REDUCTION: This exam was performed according to the departmental dose-optimization program which includes automated exposure control, adjustment of the mA and/or kV according to patient size and/or use of iterative reconstruction technique. COMPARISON:  02/16/2018 FINDINGS: Brain: No acute infarct or hemorrhage. Lateral ventricles and midline structures are unremarkable. Age advanced cerebral and cerebellar atrophy. No acute extra-axial fluid collections. No mass effect. Vascular: No hyperdense vessel or unexpected calcification. Skull: Normal. Negative for fracture or focal lesion. Sinuses/Orbits: No acute finding. Other: None. IMPRESSION: 1. No acute intracranial process. Electronically Signed   By: Randa Ngo M.D.   On: 07/23/2022 23:22   DG Knee Right Port  Result Date: 07/23/2022 CLINICAL DATA:  Fall, pain EXAM: PORTABLE RIGHT KNEE - 1-2 VIEW COMPARISON:  02/23/2014 FINDINGS: There is deformity within the distal left femur which appears to be related to old healed fracture. Moderate tricompartment degenerative changes. Moderate joint effusion. No subluxation or dislocation. IMPRESSION: Apparent old healed distal femoral fracture with deformity, degenerative changes and moderate joint effusion. No definite acute bony abnormality. Electronically Signed   By: Rolm Baptise M.D.   On: 07/23/2022 23:22   DG Knee Left Port  Result Date: 07/23/2022 CLINICAL DATA:  Fall, pain EXAM: PORTABLE LEFT KNEE - 1-2 VIEW COMPARISON:  07/09/2021 FINDINGS: There is a comminuted fracture noted through the distal left femoral metaphysis, possibly extending intra-articular into the knee joint. Mild displacement and angulation. Associated joint effusion. No subluxation or dislocation. IMPRESSION: Comminuted, mildly displaced and angulated distal left femoral fracture with probable intra-articular  extension. Associated joint effusion. Electronically Signed   By: Rolm Baptise M.D.   On: 07/23/2022 23:21   DG Chest Portable 1 View  Result Date: 07/23/2022 CLINICAL DATA:  Peripheral edema EXAM: PORTABLE CHEST 1 VIEW COMPARISON:  03/19/2016 FINDINGS: Mild left basilar atelectasis or scarring. Lungs are otherwise clear. No pneumothorax or pleural effusion. Cardiac size within normal limits. No acute bone abnormality. IMPRESSION: 1. Mild left basilar atelectasis or scarring. Electronically Signed   By: Linwood Dibbles.D.  On: 07/23/2022 22:30               LOS: 1 day   Izen Petz  [facility]   Pager on [email or web address]. If 7PM-7AM, please contact night-coverage at [email or web address]     07/25/2022, 10:13 AM

## 2022-07-25 NOTE — Progress Notes (Signed)
   07/25/22 1750  Rapid Response Notification  Name of Rapid Response RN Notified Jinny Blossom Furr,RN  Date Rapid Response Notified 07/25/22  Time Rapid Response Notified [id number]   MD at bedside Labs drawn, Angio study ordered

## 2022-07-25 NOTE — Progress Notes (Signed)
       CROSS COVER NOTE  NAME: TALLEY SCHLUND MRN: [id number] DOB : [birth year] ATTENDING PHYSICIAN: Jennye Boroughs, MD    Date of Service   07/25/2022   HPI/Events of Note   Report/Request ***This is the patient that reported the lactic acid of 3.3 on at shift change. When you get a chance, would you be able to come and look at patient? His temp is still 103.1 after tylenol, he is lethargic and is not verbal. He is on a non-rebreather mask w/oxygen sats of 100%. They did report that he was speaking a little before the rapid response occurred. He does have PERRLA. He appears very sick and I am afraid that he may be better suited for another floor.  On Review of chart ***Ordered interventions have not been given. Vancomycin and Cefepime still pending, no documentation of tylenol give but night RN reports day RN gave. Bedside eval***Lethargic, opens eyes to voice, tracks follows commands, good air movement HPI***  2200: Alert, attempts to answer questions  Interventions   Assessment/Plan: 1L LR CTA --> neg PE X    *** professional thanks      To reach the provider On-Call:   7AM- 7PM see care teams to locate the attending and reach out to them via [email or web address] Password: [email or web address] 7PM-7AM contact night-coverage If you still have difficulty reaching the appropriate provider, please page the [facility] (Director on Call) for [facility] on amion for assistance  This document was prepared using Systems analyst and may include unintentional dictation errors.  Neomia Glass DNP, MBA, FNP-BC, PMHNP-BC Nurse Practitioner [facility] Pager [phone]

## 2022-07-25 NOTE — TOC Initial Note (Signed)
Transition of Care [facility]) - Initial/Assessment Note    Patient Details  Name: Daniel Mitchell MRN: [id number] Date of Birth: [birth year]  Transition of Care [facility]) CM/SW Contact:    Candie Chroman, LCSW Phone Number: 07/25/2022, 11:38 AM  Clinical Narrative:  [facility]'s nurse from [facility] at bedside, [facility]. She stated patient typically uses a stand lift at the facility but feels like he may need hoyer lift. Melissa wants to have him evaluated to see if he needs SNF. MD is aware. Ortho consult pending.                Expected Discharge Plan:  (TBD) Barriers to Discharge: Continued Medical Work up   Patient Goals and CMS Choice            Expected Discharge Plan and Services     Post Acute Care Choice:  (TBD) Living arrangements for the past 2 months: Group Home                                      Prior Living Arrangements/Services Living arrangements for the past 2 months: Group Home Lives with:: Facility Resident Patient language and need for interpreter reviewed:: Yes Do you feel safe going back to the place where you live?: Yes      Need for Family Participation in Patient Care: Yes (Comment) Care giver support system in place?: Yes (comment) Current home services: DME Criminal Activity/Legal Involvement Pertinent to Current Situation/Hospitalization: No - Comment as needed  Activities of Daily Living Home Assistive Devices/Equipment: Wheelchair    Permission Sought/Granted Permission sought to share information with : Facility Arts administrator granted to share info w AGENCY: [facility]        Emotional Assessment Appearance:: Appears stated age Attitude/Demeanor/Rapport: Unable to Assess Affect (typically observed): Unable to Assess Orientation: : Oriented to Self Alcohol / Substance Use: Not Applicable Psych Involvement: No (comment)  Admission diagnosis:  CHF (congestive heart failure)  [I50.9] Leg edema [R60.0] Demand ischemia [I24.89] Anemia, unspecified type [D64.9] Patient Active Problem List   Diagnosis Date Noted   CHF (congestive heart failure) 07/24/2022   Other fracture of left femur, initial encounter for closed fracture 07/24/2022   Edema of both legs 04/23/2018   History of external ear infection 02/09/2016   Cognitive disorder 02/03/2016   Unspecified convulsions 11/05/2013   Controlled type 2 diabetes mellitus with complication, without long-term current use of insulin 10/26/2013   Type II diabetes mellitus 10/26/2013   Chronic periodontitis 09/25/2012   PCP:  Kirk Ruths, MD Pharmacy:   [facility], [place] [address] Phone: [phone] Fax: [phone]     Social Determinants of Health (SDOH) Social History: SDOH Screenings   Tobacco Use: Low Risk  (07/25/2022)   SDOH Interventions:     Readmission Risk Interventions     No data to display

## 2022-07-25 NOTE — Progress Notes (Addendum)
I was notified by Helene Kelp, RN, that patient had developed a fever with temperature of 103.3 F.  He was tachycardic with heart rate up to 124.  I went to reevaluate the patient at the bedside.  When I go to the room, Helene Kelp, RN, respiratory therapist and the rapid response team was already present at the bedside.  I was informed by Helene Kelp, RN, that patient had lunch today.  He was fed by his caregiver from the group home.  Vital signs: Temperature 103.3 F, heart rate 124, oxygen saturation 74% on room air, blood pressure systolic of 94  Patient is lethargic and speech is incomprehensible.  He can only say a few words at baseline and speech was not very different from this morning.    Exam significant for bibasilar rales but no wheezing.  He moves bilateral upper extremities spontaneously but he does not move lower extremities.  Stat chest x-ray, urinalysis, blood cultures, CBC, CMP, procalcitonin and lactic acid have been ordered.  IV vancomycin and cefepime have been ordered through pharmacy consult.  CTA chest has been ordered to rule out pulmonary embolism.  He will be started on therapeutic dose of Lovenox while awaiting CT chest results. He has been placed on oxygen via nonrebreather mask.  Taper down oxygen to oxygen via nasal cannula as able.  Of note, patient had IV Lasix 40 mg x 1 dose at 12:05 PM today.  Doubt current decompensation is due to CHF exacerbation.  Differential diagnosis at this time includes aspiration event, sepsis, pneumonia, pulmonary embolism.  IV fluids will be ordered if chest x-ray does not show CHF pattern.  ABG ordered showed pH 7.52, pCO2 39, pO2 58.    [name], patient's sister, has been updated over the phone.   CRITICAL CARE Performed by: Jennye Boroughs   Total critical care time: 40 minutes  Critical care time was exclusive of separately billable procedures and treating other patients.  Critical care was necessary to treat or prevent imminent or  life-threatening deterioration.  Critical care was time spent personally by me on the following activities: development of treatment plan with patient and/or surrogate as well as nursing, discussions with consultants, evaluation of patient's response to treatment, examination of patient, obtaining history from patient or surrogate, ordering and performing treatments and interventions, ordering and review of laboratory studies, ordering and review of radiographic studies, pulse oximetry and re-evaluation of patient's condition.

## 2022-07-25 NOTE — Consult Note (Signed)
Pharmacy Antibiotic Note  Daniel Mitchell is a 57 y.o. male admitted on 07/23/2022 with left femoral fx following fall on 07/20/22. Also found to have CHF exacerbation. Developed new fever on 07/25/22.  Pharmacy has been consulted for vancomycin and cefepime dosing for possible sepsis.   Plan: Initiate cefepime 2 gram q8H Initiate Vancomycin 1500 mg Q24H. Goal AUC 400-550 Estimated AUC 488 Scr 0.8, IBW, Vd 0.72    Height: 5' 1.81" (157 cm) Weight: 63 kg (138 lb 14.2 oz) IBW/kg (Calculated) : 54.17  Temp (24hrs), Avg:101.2 F (38.4 C), Min:98.4 F (36.9 C), Max:103.3 F (39.6 C)  Recent Labs  Lab 07/23/22 2156 07/24/22 0443 07/25/22 0442  WBC 6.0 5.9 6.4  CREATININE 0.51* 0.54* 0.58*    Estimated Creatinine Clearance: 79 mL/min (A) (by C-G formula based on SCr of 0.58 mg/dL (L)).    Allergies  Allergen Reactions   Topamax [Topiramate]    Zithromax [Azithromycin]     Antimicrobials this admission: 07/25/22 cefepime >>  07/25/22 Vancomycin >>   Dose adjustments this admission:   Microbiology results: 4/1 BCx: sent  UCx:     Thank you for allowing pharmacy to be a part of this patient's care.  Dorothe Pea, PharmD, BCPS Clinical Pharmacist   07/25/2022 5:53 PM

## 2022-07-25 NOTE — Progress Notes (Signed)
   07/25/22 1920  Provider Notification  Provider Name/Title Neomia Glass  Date Provider Notified 07/25/22  Time Provider Notified 1920  Method of Notification Call  Notification Reason Critical Result  Test performed and critical result lactic acid 3.3  Date Critical Result Received 07/25/22  Time Critical Result Received [id number]  Provider response No new orders;Other (Comment) Neomia Glass stated she was going to look at the chart.)  Date of Provider Response 07/25/22  Time of Provider Response 1850 (same conversation)

## 2022-07-25 NOTE — Progress Notes (Signed)
Plan to go to CT then transfer to 2A for progressive care. Patient able to come off NRB onto 6L nasal cannula. Patient remains febrile despite tylenol at 1740.     07/25/22 2049  Assess: MEWS Score  Temp (!) 102.2 F (39 C)  BP 99/68  MAP (mmHg) 79  Pulse Rate (!) 118  Resp (!) 48  Level of Consciousness Alert  SpO2 95 %  O2 Device Nasal Cannula  O2 Flow Rate (L/min) 6 L/min  Assess: MEWS Score  MEWS Temp 2  MEWS Systolic 1  MEWS Pulse 2  MEWS RR 3  MEWS LOC 0  MEWS Score 8  MEWS Score Color Red  Assess: if the MEWS score is Yellow or Red  Were vital signs taken at a resting state? Yes  Focused Assessment No change from prior assessment  Does the patient meet 2 or more of the SIRS criteria? No  Does the patient have a confirmed or suspected source of infection? Yes  Provider and Rapid Response Notified? No  MEWS guidelines implemented  Yes, red  Treat  MEWS Interventions Considered administering scheduled or prn medications/treatments as ordered  Take Vital Signs  Increase Vital Sign Frequency  Red: Q1hr x2, continue Q4hrs until patient remains green for 12hrs  Escalate  MEWS: Escalate Red: Discuss with charge nurse and notify provider. Consider notifying RRT. If remains red for 2 hours consider need for higher level of care  Notify: Charge Nurse/RN  Name of Charge Nurse/RN Notified Lu Duffel RN ICU charge  Notify: Rapid Response  Name of Rapid Response RN Notified Lu Duffel RN  Date Rapid Response Notified 07/25/22  Time Rapid Response Notified 2055  Assess: SIRS CRITERIA  SIRS Temperature  1  SIRS Pulse 1  SIRS Respirations  1  SIRS WBC 0  SIRS Score Sum  3

## 2022-07-25 NOTE — Progress Notes (Addendum)
Upon getting report, noticed that patient was pale, lethargic, persistent temp of 103 and red MEWS due to temp and tachycardia. Patient overall visual assessment did not appear well. Report given that a rapid response was called on day shift around 1750. Day nurse stated that there was suspicion of aspiration. Chest X-ray done which revealed developing parenchymal opacity along the left mid to lower lung. Notified AC in person. Patient had tylenol suppository at 1740. Temp continues to be elevated-notified NP [place]y. Started antibiotics, fluids and transferring to 2A. Lactic decreased from 3.3-2.0. Blood glucose 175. Report given to [facility], RN whom came in person to assess patient. CT will be done en route to 2A.

## 2022-07-25 NOTE — Progress Notes (Signed)
ANTICOAGULATION CONSULT NOTE - Initial Consult  Pharmacy Consult for Lovenox Indication:  suspected PE-    Allergies  Allergen Reactions   Topamax [Topiramate]    Zithromax [Azithromycin]     Patient Measurements: Height: 5' 1.81" (157 cm) Weight: 63 kg (138 lb 14.2 oz) IBW/kg (Calculated) : 54.17 Heparin Dosing Weight:    Vital Signs: Temp: 103.3 F (39.6 C) (04/01 1739) Temp Source: Oral (04/01 0813) BP: 108/71 (04/01 1739) Pulse Rate: 124 (04/01 1739)  Labs: Recent Labs    07/23/22 2156 07/24/22 0443 07/25/22 0442 07/25/22 1758  HGB 8.7* 8.9* 8.1* 8.6*  HCT 27.0* 27.3* 24.9* 27.5*  PLT 144* 153 165 177  CREATININE 0.51* 0.54* 0.58*  --   TROPONINIHS 44*  --   --   --     Estimated Creatinine Clearance: 79 mL/min (A) (by C-G formula based on SCr of 0.58 mg/dL (L)).   Medical History: Past Medical History:  Diagnosis Date   Ataxia    Demyelinating motor neuropathy   Chronic UTI (urinary tract infection)    Depression    History of depression   Developmental disability    Diabetes mellitus type 2, controlled    Hypertension    Penile hypospadias 03/28/14   Seasonal allergies    Seizure disorder    Mother states patient has never had a seizure.   Urethral foreign body 08/05/14   Dr. Bernardo Heater   Urogenital disorder    Multiple surgeries; H/O Undescended testicle; nocturnal enuresis    Medications:  Medications Prior to Admission  Medication Sig Dispense Refill Last Dose   acetaminophen (TYLENOL) 500 MG tablet Take 500 mg by mouth every 4 (four) hours as needed.   prn at prn   aluminum-magnesium hydroxide 200-200 MG/5ML suspension Take by mouth every 6 (six) hours as needed for indigestion. Take per standard PRN orders.   prn at prn   azelastine (ASTELIN) 0.1 % nasal spray Place 2 sprays into both nostrils 2 (two) times daily. Use in each nostril as directed   07/23/2022 at 1700   baclofen (LIORESAL) 10 MG tablet Take 10 mg by mouth 3 (three) times daily.    07/23/2022 at 2000   cetirizine (ZYRTEC) 10 MG tablet Take 10 mg by mouth daily.   07/23/2022 at 0700   Chelated Zinc 50 MG TABS Take 1 tablet by mouth daily.   07/23/2022 at 0700   Cholecalciferol (VITAMIN D) 50 MCG (2000 UT) tablet Take 2,000 Units by mouth daily.   07/23/2022 at 0700   diclofenac sodium (VOLTAREN) 1 % GEL Apply 2 g topically 3 (three) times daily. To bilateral knees.   07/23/2022 at 2000   divalproex (DEPAKOTE ER) 250 MG 24 hr tablet Take 250 mg by mouth 2 (two) times daily.   07/23/2022 at 2000   divalproex (DEPAKOTE ER) 500 MG 24 hr tablet Take 500 mg by mouth 2 (two) times daily.   07/23/2022 at 2000   docusate sodium (COLACE) 100 MG capsule Take 100 mg by mouth 2 (two) times daily as needed.    prn at prn   ferrous sulfate 325 (65 FE) MG tablet Take 325 mg by mouth daily.   07/23/2022 at 0700   furosemide (LASIX) 20 MG tablet Take 20 mg by mouth once daily as needed for edema (if not relieved by Torsemide).   07/23/2022 at unknown   guaiFENesin-dextromethorphan (ROBITUSSIN DM) 100-10 MG/5ML syrup Take 5 mLs by mouth every 4 (four) hours as needed for cough. Take per standard  PRN orders.      loperamide (IMODIUM A-D) 2 MG tablet Take 2 mg by mouth 4 (four) times daily as needed for diarrhea or loose stools. Take per standard PRN orders.   prn at prn   loratadine (CLARITIN) 10 MG tablet Take 10 mg by mouth daily.   07/23/2022 at 0700   magnesium hydroxide (MILK OF MAGNESIA) 400 MG/5ML suspension Take by mouth daily as needed for mild constipation. Take  per standard PRN orders.   prn at prn   metFORMIN (GLUCOPHAGE) 500 MG tablet Take 500 mg by mouth 2 (two) times daily with a meal.   07/23/2022 at 1700   Olopatadine HCl (PATANASE) 0.6 % SOLN Place into the nose. Nasal spray: Used 2 sprays in each nostril twice daily as needed for nasal congestion.   prn at prn   paliperidone (INVEGA) 3 MG 24 hr tablet Take 3 mg by mouth every morning.   07/23/2022 at 0700   potassium chloride (KLOR-CON) 10  MEQ tablet Take 10 mEq by mouth every Monday, Wednesday, and Friday.   07/22/2022 at 0700   pravastatin (PRAVACHOL) 20 MG tablet Take 20 mg by mouth at bedtime.   07/22/2022 at 2000   risperiDONE (RISPERDAL) 2 MG tablet Take 2 mg by mouth daily as needed (psychosis).   prn at prn   sertraline (ZOLOFT) 100 MG tablet Take 100 mg by mouth daily.   07/22/2022 at 0700   terbinafine (LAMISIL) 1 % cream Apply 1 application  topically daily. Apply to both feet for fungal rash.   07/22/2022 at 0700   tolnaftate (TINACTIN) 1 % spray Apply topically 2 (two) times daily. Take per standard PRN orders.   prn at prn   torsemide (DEMADEX) 5 MG tablet Take 5 mg by mouth every Monday, Wednesday, and Friday.   07/22/2022 at 0700   Scheduled:   baclofen  10 mg Oral TID   divalproex  750 mg Oral BID   enoxaparin (LOVENOX) injection  1 mg/kg Subcutaneous Q12H   furosemide  40 mg Intravenous BID   insulin aspart  0-6 Units Subcutaneous TID WC   paliperidone  3 mg Oral BH-q7a   potassium chloride  40 mEq Oral Daily   pravastatin  20 mg Oral Daily   [START ON 07/26/2022] sertraline  100 mg Oral Daily   sodium chloride flush  3 mL Intravenous Q12H   Infusions:   ceFEPime (MAXIPIME) IV     vancomycin      Assessment: 57 yo M to start Lovenox therapeutic dosing for suspected Pulmonary Embolism. Wt=63 kg  Crcl 79 ml/min Hgb 8.6  Plt 177  Goal of Therapy:  Monitor platelets by anticoagulation protocol: Yes   Plan:  Will order Lovenox 1 mg/kg q12h. Will discontinue Heparin SQ order (last received~1200 07/25/22) Will f/u Scr and CBC per protocol Per MD note 07/25/22: CTA chest has been ordered to rule out pulmonary embolism. He will be started on therapeutic dose of Lovenox while awaiting CT chest results.   Desire Fulp A 07/25/2022,6:41 PM

## 2022-07-25 NOTE — Consult Note (Addendum)
ORTHOPAEDIC CONSULTATION  REQUESTING PHYSICIAN: Jennye Boroughs, MD  Chief Complaint:   Left knee pain and swelling.  History of Present Illness: Daniel Mitchell is a 57 y.o. 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  Normally the patient is not ambulatory and gets around in a wheelchair.  Apparently, the patient fell 5 days ago, injuring his left knee.  Because of increased pain and swelling of the left knee and lower leg, he was brought to the emergency room on the evening of 07/23/2022.  X-rays of both knees were obtained.  The left knee films were notable for an impacted femur fracture with intra-articular extension.  Subsequently, the patient has been admitted to the hospitalist service for further evaluation and treatment.  Orthopedic consultation has been requested for his left supracondylar femur fracture.  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   Urethral foreign body 08/05/14   Dr. Bernardo Heater   Urogenital disorder    Multiple surgeries; H/O Undescended testicle; nocturnal enuresis   Past Surgical History:  Procedure Laterality Date   CYSTOSCOPY  04/09/14   Status post cystoscopy and removal of calculus   CYSTOSCOPY  09/03/14   Status post cystoscopy with removal of calculus   INGUINAL HERNIA REPAIR     Social History   Socioeconomic History   Marital status: Single    Spouse name: Not on file   Number of children: Not on file   Years of  education: Not on file   Highest education level: Not on file  Occupational History   Not on file  Tobacco Use   Smoking status: Never   Smokeless tobacco: Never  Substance and Sexual Activity   Alcohol use: No   Drug use: No   Sexual activity: Not on file  Other Topics Concern   Not on file  Social History Narrative   Not on file   Social Determinants of Health   Financial Resource Strain: Not on file  Food Insecurity: Not on file  Transportation Needs: Not on file  Physical Activity: Not on file  Stress: Not on file  Social Connections: Not on file   Family History  Problem Relation Age of Onset   Stroke Father    Allergies  Allergen Reactions   Topamax [Topiramate]    Zithromax [Azithromycin]    Prior to Admission medications   Medication Sig Start Date End Date Taking? Authorizing Provider  acetaminophen (TYLENOL) 500 MG tablet Take 500 mg by mouth every 4 (four) hours as needed.   Yes [provider]  aluminum-magnesium hydroxide 200-200 MG/5ML suspension Take by mouth every 6 (six) hours as needed for indigestion. Take per standard PRN orders.   Yes [provider]  azelastine (ASTELIN) 0.1 % nasal spray Place 2 sprays into both nostrils 2 (two) times daily. Use in each nostril as directed   Yes [provider]  baclofen (LIORESAL) 10 MG tablet Take 10 mg by mouth 3 (three) times daily.   Yes [provider]  cetirizine (ZYRTEC)  10 MG tablet Take 10 mg by mouth daily.   Yes [provider]  Chelated Zinc 50 MG TABS Take 1 tablet by mouth daily. 08/17/21  Yes [provider]  Cholecalciferol (VITAMIN D) 50 MCG (2000 UT) tablet Take 2,000 Units by mouth daily.   Yes [provider]  diclofenac sodium (VOLTAREN) 1 % GEL Apply 2 g topically 3 (three) times daily. To bilateral knees.   Yes [provider]  divalproex (DEPAKOTE ER) 250 MG 24 hr tablet Take 250 mg by mouth 2 (two) times daily. 07/12/22   Yes [provider]  divalproex (DEPAKOTE ER) 500 MG 24 hr tablet Take 500 mg by mouth 2 (two) times daily. 12/22/20  Yes [provider]  docusate sodium (COLACE) 100 MG capsule Take 100 mg by mouth 2 (two) times daily as needed.    Yes [provider]  ferrous sulfate 325 (65 FE) MG tablet Take 325 mg by mouth daily.   Yes [provider]  furosemide (LASIX) 20 MG tablet Take 20 mg by mouth once daily as needed for edema (if not relieved by Torsemide). 07/12/21  Yes [provider]  guaiFENesin-dextromethorphan (ROBITUSSIN DM) 100-10 MG/5ML syrup Take 5 mLs by mouth every 4 (four) hours as needed for cough. Take per standard PRN orders.   Yes [provider]  loperamide (IMODIUM A-D) 2 MG tablet Take 2 mg by mouth 4 (four) times daily as needed for diarrhea or loose stools. Take per standard PRN orders.   Yes [provider]  loratadine (CLARITIN) 10 MG tablet Take 10 mg by mouth daily.   Yes [provider]  magnesium hydroxide (MILK OF MAGNESIA) 400 MG/5ML suspension Take by mouth daily as needed for mild constipation. Take  per standard PRN orders.   Yes [provider]  metFORMIN (GLUCOPHAGE) 500 MG tablet Take 500 mg by mouth 2 (two) times daily with a meal.   Yes [provider]  Olopatadine HCl (PATANASE) 0.6 % SOLN Place into the nose. Nasal spray: Used 2 sprays in each nostril twice daily as needed for nasal congestion.   Yes [provider]  paliperidone (INVEGA) 3 MG 24 hr tablet Take 3 mg by mouth every morning.   Yes [provider]  potassium chloride (KLOR-CON) 10 MEQ tablet Take 10 mEq by mouth every Monday, Wednesday, and Friday. 02/09/22  Yes [provider]  pravastatin (PRAVACHOL) 20 MG tablet Take 20 mg by mouth at bedtime.   Yes [provider]  risperiDONE (RISPERDAL) 2 MG tablet Take 2 mg by mouth daily as needed (psychosis).   Yes [provider]  sertraline (ZOLOFT) 100 MG tablet Take 100 mg by mouth daily.   Yes [provider]  terbinafine (LAMISIL) 1 % cream Apply 1 application  topically daily. Apply to both feet for fungal rash.   Yes [provider]  tolnaftate (TINACTIN) 1 % spray Apply topically 2 (two) times daily. Take per standard PRN orders.   Yes Kirk Ruths, MD  torsemide [facility]) 5 MG tablet Take 5 mg by mouth every Monday, Wednesday, and Friday.   Yes [provider]   ECHOCARDIOGRAM COMPLETE  Result Date: 07/24/2022    ECHOCARDIOGRAM REPORT   Patient Name:   Daniel Mitchell Date of Exam: 07/24/2022 Medical Rec #:  [id number]         Height:       63.0 in Accession #:    [id number]  Weight:       136.9 lb Date of Birth:  Aug 22, 1965         BSA:          1.646 m Patient Age:    30 years          BP:           142/85 mmHg Patient Gender: M                 HR:           111 bpm. Exam Location:  [facility] Procedure: 2D Echo and Intracardiac Opacification Agent Indications:     Bilateral Edema  History:         Patient has no prior history of Echocardiogram examinations.  Sonographer:     Kathlen Brunswick RDCS Referring Phys:  [id number] Gretta Cool PATEL Diagnosing Phys: Kate Sable MD  Sonographer Comments: Technically difficult study due to poor echo windows, no subcostal window and suboptimal apical window. Image acquisition challenging due to patient body habitus. IMPRESSIONS  1. Left ventricular ejection fraction, by estimation, is 65 to 70%. The left ventricle has normal function. The left ventricle has no regional wall motion abnormalities. Left ventricular diastolic parameters are consistent with Grade I diastolic dysfunction (impaired relaxation).  2. Right ventricular systolic function is normal. The right ventricular size is normal.  3. The mitral valve is normal in structure. No evidence of mitral valve regurgitation.  4. The aortic valve is tricuspid. Aortic valve regurgitation is not  visualized.  5. Aortic dilatation noted. There is mild dilatation of the aortic root, measuring 39 mm. FINDINGS  Left Ventricle: Left ventricular ejection fraction, by estimation, is 65 to 70%. The left ventricle has normal function. The left ventricle has no regional wall motion abnormalities. Definity contrast agent was given IV to delineate the left ventricular  endocardial borders. The left ventricular internal cavity size was normal in size. There is no left ventricular hypertrophy. Left ventricular diastolic parameters are consistent with Grade I diastolic dysfunction (impaired relaxation). Right Ventricle: The right ventricular size is normal. No increase in right ventricular wall thickness. Right ventricular systolic function is normal. Left Atrium: Left atrial size was normal in size. Right Atrium: Right atrial size was normal in size. Pericardium: There is no evidence of pericardial effusion. Mitral Valve: The mitral valve is normal in structure. No evidence of mitral valve regurgitation. Tricuspid Valve: The tricuspid valve is normal in structure. Tricuspid valve regurgitation is not demonstrated. Aortic Valve: The aortic valve is tricuspid. Aortic valve regurgitation is not visualized. Aortic valve peak gradient measures 6.0 mmHg. Pulmonic Valve: The pulmonic valve was normal in structure. Pulmonic valve regurgitation is not visualized. Aorta: Aortic dilatation noted. There is mild dilatation of the aortic root, measuring 39 mm. Venous: The inferior vena cava was not well visualized. IAS/Shunts: No atrial level shunt detected by color flow Doppler.  LEFT VENTRICLE PLAX 2D LVIDd:         4.20 cm     Diastology LVIDs:         3.00 cm     LV e' medial:    6.53 cm/s LV PW:         1.00 cm     LV E/e' medial:  9.2 LV IVS:        0.90 cm     LV e' lateral:   10.40 cm/s LVOT diam:     2.00 cm     LV E/e' lateral: 5.8 LV  SV:         76 LV SV Index:   46 LVOT Area:     3.14 cm  LV Volumes (MOD) LV vol d, MOD  A4C: 56.8 ml LV vol s, MOD A4C: 21.7 ml LV SV MOD A4C:     56.8 ml RIGHT VENTRICLE RV Basal diam:  2.60 cm RV S prime:     15.90 cm/s TAPSE (M-mode): 1.7 cm LEFT ATRIUM           Index        RIGHT ATRIUM          Index LA diam:      2.60 cm 1.58 cm/m   RA Area:     6.66 cm LA Vol (A2C): 10.9 ml 6.62 ml/m   RA Volume:   11.40 ml 6.93 ml/m LA Vol (A4C): 16.7 ml 10.15 ml/m  AORTIC VALVE                 PULMONIC VALVE AV Area (Vmax): 2.83 cm     PV Vmax:        1.04 m/s AV Vmax:        122.00 cm/s  PV Peak grad:   4.3 mmHg AV Peak Grad:   6.0 mmHg     RVOT Peak grad: 3 mmHg LVOT Vmax:      110.00 cm/s LVOT Vmean:     77.200 cm/s LVOT VTI:       0.241 m  AORTA Ao Root diam: 3.90 cm Ao Asc diam:  2.90 cm MITRAL VALVE               TRICUSPID VALVE MV Area (PHT): 6.32 cm    TR Peak grad:   23.4 mmHg MV Decel Time: 120 msec    TR Vmax:        242.00 cm/s MV E velocity: 60.30 cm/s MV A velocity: 87.50 cm/s  SHUNTS MV E/A ratio:  0.69        Systemic VTI:  0.24 m                            Systemic Diam: 2.00 cm Kate Sable MD Electronically signed by Kate Sable MD Signature Date/Time: 07/24/2022/1:48:58 PM    Final    US Venous Img Lower Bilateral (DVT)  Result Date: 07/24/2022 CLINICAL DATA:  Bilateral lower extremity pain and edema. EXAM: BILATERAL LOWER EXTREMITY VENOUS DOPPLER ULTRASOUND TECHNIQUE: Gray-scale sonography with graded compression, as well as color Doppler and duplex ultrasound were performed to evaluate the lower extremity deep venous systems from the level of the common femoral vein and including the common femoral, femoral, profunda femoral, popliteal and calf veins including the posterior tibial, peroneal and gastrocnemius veins when visible. The superficial great saphenous vein was also interrogated. Spectral Doppler was utilized to evaluate flow at rest and with distal augmentation maneuvers in the common femoral, femoral and popliteal veins. COMPARISON:  Prior left lower  extremity study on 07/09/2021 and bilateral study on 03/19/2016 FINDINGS: RIGHT LOWER EXTREMITY Common Femoral Vein: No evidence of thrombus. Normal compressibility, respiratory phasicity and response to augmentation. Saphenofemoral Junction: No evidence of thrombus. Normal compressibility and flow on color Doppler imaging. Profunda Femoral Vein: No evidence of thrombus. Normal compressibility and flow on color Doppler imaging. Femoral Vein: No evidence of thrombus. Normal compressibility, respiratory phasicity and response to augmentation. Popliteal Vein: No evidence of thrombus. Normal compressibility, respiratory phasicity and response to augmentation.  Calf Veins: No evidence of thrombus. Normal compressibility and flow on color Doppler imaging. Superficial Great Saphenous Vein: No evidence of thrombus. Normal compressibility. Venous Reflux:  None. Other Findings: No evidence of superficial thrombophlebitis or abnormal fluid collection. LEFT LOWER EXTREMITY Common Femoral Vein: No evidence of thrombus. Normal compressibility, respiratory phasicity and response to augmentation. Saphenofemoral Junction: No evidence of thrombus. Normal compressibility and flow on color Doppler imaging. Profunda Femoral Vein: No evidence of thrombus. Normal compressibility and flow on color Doppler imaging. Femoral Vein: No evidence of thrombus. Normal compressibility, respiratory phasicity and response to augmentation. Popliteal Vein: No evidence of thrombus. Normal compressibility, respiratory phasicity and response to augmentation. Calf Veins: No evidence of thrombus. Normal compressibility and flow on color Doppler imaging. Superficial Great Saphenous Vein: No evidence of thrombus. Normal compressibility. Venous Reflux:  None. Other Findings: No evidence of superficial thrombophlebitis or abnormal fluid collection. IMPRESSION: No evidence of deep venous thrombosis in either lower extremity. Electronically Signed   By: Aletta Edouard M.D.   On: 07/24/2022 09:23   CT HEAD WO CONTRAST (5MM)  Result Date: 07/23/2022 CLINICAL DATA:  [place] on Wednesday, head trauma EXAM: CT HEAD WITHOUT CONTRAST TECHNIQUE: Contiguous axial images were obtained from the base of the skull through the vertex without intravenous contrast. RADIATION DOSE REDUCTION: This exam was performed according to the departmental dose-optimization program which includes automated exposure control, adjustment of the mA and/or kV according to patient size and/or use of iterative reconstruction technique. COMPARISON:  02/16/2018 FINDINGS: Brain: No acute infarct or hemorrhage. Lateral ventricles and midline structures are unremarkable. Age advanced cerebral and cerebellar atrophy. No acute extra-axial fluid collections. No mass effect. Vascular: No hyperdense vessel or unexpected calcification. Skull: Normal. Negative for fracture or focal lesion. Sinuses/Orbits: No acute finding. Other: None. IMPRESSION: 1. No acute intracranial process. Electronically Signed   By: Randa Ngo M.D.   On: 07/23/2022 23:22   DG Knee Right Port  Result Date: 07/23/2022 CLINICAL DATA:  Fall, pain EXAM: PORTABLE RIGHT KNEE - 1-2 VIEW COMPARISON:  02/23/2014 FINDINGS: There is deformity within the distal left femur which appears to be related to old healed fracture. Moderate tricompartment degenerative changes. Moderate joint effusion. No subluxation or dislocation. IMPRESSION: Apparent old healed distal femoral fracture with deformity, degenerative changes and moderate joint effusion. No definite acute bony abnormality. Electronically Signed   By: Rolm Baptise M.D.   On: 07/23/2022 23:22   DG Knee Left Port  Result Date: 07/23/2022 CLINICAL DATA:  Fall, pain EXAM: PORTABLE LEFT KNEE - 1-2 VIEW COMPARISON:  07/09/2021 FINDINGS: There is a comminuted fracture noted through the distal left femoral metaphysis, possibly extending intra-articular into the knee joint. Mild displacement and  angulation. Associated joint effusion. No subluxation or dislocation. IMPRESSION: Comminuted, mildly displaced and angulated distal left femoral fracture with probable intra-articular extension. Associated joint effusion. Electronically Signed   By: Rolm Baptise M.D.   On: 07/23/2022 23:21   DG Chest Portable 1 View  Result Date: 07/23/2022 CLINICAL DATA:  Peripheral edema EXAM: PORTABLE CHEST 1 VIEW COMPARISON:  03/19/2016 FINDINGS: Mild left basilar atelectasis or scarring. Lungs are otherwise clear. No pneumothorax or pleural effusion. Cardiac size within normal limits. No acute bone abnormality. IMPRESSION: 1. Mild left basilar atelectasis or scarring. Electronically Signed   By: Fidela Salisbury M.D.   On: 07/23/2022 22:30    Positive ROS: All other systems have been reviewed and were otherwise negative with the exception of those mentioned in the HPI and as  above.  Physical Exam: General: Patient appears somewhat groggy at this time, but no acute distress Psychiatric:  Patient is not competent for consent   Cardiovascular:  No pedal edema Respiratory:  No wheezing, non-labored breathing GI:  Abdomen is soft and non-tender Skin:  No lesions in the area of chief complaint Neurologic:  Sensation intact distally Lymphatic:  No axillary or cervical lymphadenopathy  Orthopedic Exam:  Orthopedic examination is limited to the left knee and lower extremity.  Skin inspection around the left knee is notable for mild swelling and resolving ecchymosis over the knee extending into the proximal aspect of the lower leg.  No erythema, abrasions, or other skin abnormalities are identified.  He has a tense knee effusion.  He appears to have an approximately 40 degree flexion contracture of his knee, but has pain with any attempted passive motion of the knee.  He has fair capillary refill to his left foot.  X-rays:  X-rays of the left knee are available for review and have been reviewed by myself.  The  findings are as described above.  Overall alignment of the fracture is satisfactory.  Assessment: Minimally displaced/impacted left supracondylar femur fracture with intra-articular extension.  Plan: Based on the patient's limited mobility and wheelchair dependency, as well as his other medical issues, I do not feel that he is a good surgical candidate.  Therefore, I feel that nonsurgical treatment would be the best direction for him.  A hinged knee brace will be applied to the leg to help immobilize the fracture and provide some comfort for transfers.  He may be transferred bed to chair using a Hoyer lift or other similar modality, so long as he remains nonweightbearing on the left leg.  Thank you for asking me to participate in the care of this most unfortunate man.  I will be happy to follow him with you.   Pascal Lux, MD Beeper #:  [phone]  07/25/2022 5:21 PM   Addendum: The patient appears to be having some respiratory issues at this time and may be developing sepsis as his temperature is 103.  Therefore, I will hold off on putting the brace on at this time as he is being attended to by the hospitalist and his nursing team.  I will come back tomorrow and apply the brace at that time.  Pascal Lux, MD Beeper #:  [phone]  07/25/2022 5:35 PM

## 2022-07-25 NOTE — Progress Notes (Signed)
Rapid Response Event Note   Reason for Call : lethargic, increased HR/RR, hypotensive   Initial Focused Assessment: On my arrival to bedside, pt is pale and lethargic but will respond to voice at times, mostly moaning. Pt's temp 103.3. Pt is slightly hypotensive with SBP in the 90s. O2 sats in the 50s but unable to get accurate reading. RT and MD at bedside. Caregiver at bedside.   Interventions: CBG checked and WNL. PRN rectal tylenol given. RT placed pt on nonrebreather. O2 sats 95%. MD ordered blood cultures, labs, and chest xray (waiting on chest xray results before ordering fluid bolus per MD).    Plan of Care: Pt transferring to 2A to closely monitor. Order for 541ml fluid bolus ordered per MD. BP 108/71 with MAP of 83 and O2 sats 95% on nonrebreather before leaving bedside.    Event Summary:   MD Notified: Dr. Mal Misty Call Time: [id number] Arrival Time: [id number] End Time: [place], RN

## 2022-07-26 ENCOUNTER — Inpatient Hospital Stay: Payer: Medicare Other

## 2022-07-26 ENCOUNTER — Encounter: Payer: Self-pay | Admitting: Internal Medicine

## 2022-07-26 DIAGNOSIS — J189 Pneumonia, unspecified organism: Secondary | ICD-10-CM | POA: Diagnosis not present

## 2022-07-26 DIAGNOSIS — A419 Sepsis, unspecified organism: Secondary | ICD-10-CM | POA: Diagnosis not present

## 2022-07-26 DIAGNOSIS — I5033 Acute on chronic diastolic (congestive) heart failure: Secondary | ICD-10-CM

## 2022-07-26 DIAGNOSIS — J690 Pneumonitis due to inhalation of food and vomit: Secondary | ICD-10-CM

## 2022-07-26 DIAGNOSIS — J9601 Acute respiratory failure with hypoxia: Secondary | ICD-10-CM | POA: Diagnosis not present

## 2022-07-26 DIAGNOSIS — R6521 Severe sepsis with septic shock: Secondary | ICD-10-CM

## 2022-07-26 DIAGNOSIS — S728X2A Other fracture of left femur, initial encounter for closed fracture: Secondary | ICD-10-CM | POA: Diagnosis not present

## 2022-07-26 LAB — CBC WITH DIFFERENTIAL/PLATELET
Abs Immature Granulocytes: 0.07 10*3/uL (ref 0.00–0.07)
Basophils Absolute: 0 10*3/uL (ref 0.0–0.1)
Basophils Relative: 0 %
Eosinophils Absolute: 0 10*3/uL (ref 0.0–0.5)
Eosinophils Relative: 0 %
HCT: 21.9 % — ABNORMAL LOW (ref 39.0–52.0)
Hemoglobin: 7 g/dL — ABNORMAL LOW (ref 13.0–17.0)
Immature Granulocytes: 1 %
Lymphocytes Relative: 16 %
Lymphs Abs: 1.3 10*3/uL (ref 0.7–4.0)
MCH: 30.4 pg (ref 26.0–34.0)
MCHC: 32 g/dL (ref 30.0–36.0)
MCV: 95.2 fL (ref 80.0–100.0)
Monocytes Absolute: 1.5 10*3/uL — ABNORMAL HIGH (ref 0.1–1.0)
Monocytes Relative: 18 %
Neutro Abs: 5.5 10*3/uL (ref 1.7–7.7)
Neutrophils Relative %: 65 %
Platelets: 148 10*3/uL — ABNORMAL LOW (ref 150–400)
RBC: 2.3 MIL/uL — ABNORMAL LOW (ref 4.22–5.81)
RDW: 14.9 % (ref 11.5–15.5)
Smear Review: NORMAL
WBC: 8.4 10*3/uL (ref 4.0–10.5)
nRBC: 0 % (ref 0.0–0.2)

## 2022-07-26 LAB — CREATININE, SERUM
Creatinine, Ser: 0.84 mg/dL (ref 0.61–1.24)
GFR, Estimated: >60 mL/min (ref >60)

## 2022-07-26 LAB — LACTIC ACID, PLASMA: Lactic Acid, Venous: 1.5 mmol/L (ref 0.5–1.9)

## 2022-07-26 LAB — GLUCOSE, CAPILLARY
Glucose-Capillary: 143 mg/dL — ABNORMAL HIGH (ref 70–99)
Glucose-Capillary: 125 mg/dL — ABNORMAL HIGH (ref 70–99)

## 2022-07-26 LAB — PROCALCITONIN: Procalcitonin: 5.69 ng/mL

## 2022-07-26 MED ORDER — ENOXAPARIN SODIUM 40 MG/0.4ML IJ SOSY: 40.0000 mg | PREFILLED_SYRINGE | INTRAMUSCULAR | Status: DC

## 2022-07-26 MED ORDER — NOREPINEPHRINE 4 MG/250ML-% IV SOLN: 0.0000 ug/min | INTRAVENOUS | Status: DC

## 2022-07-26 MED ORDER — DIAZEPAM 5 MG/ML IJ SOLN
5.0000 mg | INTRAMUSCULAR | Status: DC | PRN
  Administered 2022-07-26: 5 mg via INTRAVENOUS
  Filled 2022-07-26: qty 2

## 2022-07-26 MED ORDER — ENOXAPARIN SODIUM 80 MG/0.8ML IJ SOSY: 1.0000 mg/kg | PREFILLED_SYRINGE | Freq: Two times a day (BID) | INTRAMUSCULAR | Status: DC

## 2022-07-26 MED ORDER — VANCOMYCIN HCL 1250 MG/250ML IV SOLN: 1250.0000 mg | INTRAVENOUS | Status: DC

## 2022-07-26 MED ORDER — MIDODRINE HCL 5 MG PO TABS
5.0000 mg | ORAL_TABLET | Freq: Once | ORAL | Status: AC
  Administered 2022-07-26: 5 mg via ORAL
  Filled 2022-07-26: qty 1

## 2022-07-26 MED ORDER — SODIUM CHLORIDE 0.9 % IV BOLUS
500.0000 mL | Freq: Once | INTRAVENOUS | Status: AC
  Administered 2022-07-26: 500 mL via INTRAVENOUS

## 2022-07-26 NOTE — Progress Notes (Addendum)
  Interdisciplinary Goals of Care Family Meeting   Date carried out: 07/26/2022  Location of the meeting: Bedside  Member's involved: Physician and Family Member or next of kin  Durable Power of Attorney or acting medical decision maker: Daniel Mitchell, mother and legal guardian    Discussion: We discussed goals of care for [name] .    Code status:   Code Status: DNR   Disposition: In-patient comfort care  Time spent for the meeting: 15 minutes    Plan was also discussed with [name], patient's sister, over the phone  Jennye Boroughs, MD  07/26/2022, 9:24 AM

## 2022-07-26 NOTE — Progress Notes (Signed)
Patient ID: LAZAROS PROVENCAL, male   DOB: 05-22-1965, 57 y.o.   MRN: [id number]   The patient has been made comfort measures only by the family so they have declined application of the hinged knee brace.  I will sign off at this time.  Please reconsult me if you need further orthopedic input during this hospitalization.  Pascal Lux, MD Beeper #:  [phone] 07/26/2022 10:51 AM

## 2022-07-26 NOTE — Progress Notes (Signed)
Pt made CMO. DNR band applied to pt's right wrist, verified with pt's mother/legal guardian Elli Hudek at the bedside.  Tele monitor and bedside monitor Dc'd.  Pt turn and repositioned.  No distress noted.  Mouth care done.  Family at the bedside.  No needs at this time expressed

## 2022-07-26 NOTE — Plan of Care (Signed)
  Problem: Education: Goal: Knowledge of General Education information will improve Description Including pain rating scale, medication(s)/side effects and non-pharmacologic comfort measures Outcome: Progressing   

## 2022-07-26 NOTE — Progress Notes (Signed)
Progress Note    Daniel Mitchell  [id number] DOB: November 27, 1965  DOA: 07/23/2022 PCP: Kirk Ruths, MD      Brief Narrative:    Medical records reviewed and are as summarized below:  Daniel Mitchell is a 57 y.o. 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  He was brought to the emergency department on 07/23/2022 because of a fall at the group home that reportedly occurred on 07/20/2022.     Assessment/Plan:   Principal Problem:   Acute on chronic diastolic CHF (congestive heart failure) Active Problems:   Other fracture of left femur, initial encounter for closed fracture   Edema of both legs   Cognitive disorder   Controlled type 2 diabetes mellitus with complication, without long-term current use of insulin   Type II diabetes mellitus   Unspecified convulsions   Acute respiratory failure with hypoxia   Septic shock   Pneumonia    Body mass index is 25.11 kg/m.   Acute on chronic diastolic CHF, bilateral lower extremity edema Left distal femoral fracture (not a surgical candidate) Septic shock secondary to pneumonia Acute hypoxic respiratory failure Acute on chronic anemia Hypokalemia Type 2 diabetes mellitus with hyperglycemia Seizure disorder Hypertension Depression Ataxia/demyelinating motor neuropathy Dysphagia Intellectual developmental disability    Plan   Patient remained hypotensive after IV fluids.  Decision was made to transfer him to the ICU for IV Levophed.  Dr. Malcolm Metro, intensivist, was consulted to assist with management.  However, his mother and legal guardian decided against transfer to the ICU.  She requested that patient be made DNR and placed on comfort care measures.  She showed me legal  guardianship document to prove that she is the legal guardian. Aggressive treatment has been discontinued and patient has been transition to comfort care.  TOC has been consulted for hospice consult.   Plan of care was discussed with his mother at the bedside. [name], patient's sister, was also updated over the phone.     Diet Order     None                Consultants: Orthopedic surgeon  Procedures: None    Medications:    sodium chloride flush  3 mL Intravenous Q12H   Continuous Infusions:     Anti-infectives (From admission, onward)    Start     Dose/Rate Route Frequency Ordered Stop   07/26/22 2200  vancomycin (VANCOREADY) IVPB 1250 mg/250 mL  Status:  Discontinued        1,250 mg 166.7 mL/hr over 90 Minutes Intravenous Every 24 hours 07/26/22 0907 07/26/22 0922   07/25/22 2000  ceFEPIme (MAXIPIME) 2 g in sodium chloride 0.9 % 100 mL IVPB  Status:  Discontinued        2 g 200 mL/hr over 30 Minutes Intravenous Every 8 hours 07/25/22 1753 07/26/22 0922   07/25/22 1900  vancomycin (VANCOREADY) IVPB 1500 mg/300 mL  Status:  Discontinued        1,500 mg 150 mL/hr over 120 Minutes Intravenous Every 24 hours 07/25/22 1759 07/26/22 0907              Family Communication/Anticipated D/C date and plan/Code Status   DVT prophylaxis:      Code Status: DNR  Family Communication: Plan discussed with his mother at the bedside and  patient's sister over the phone Disposition Plan: Anticipate hospital death   Status is: Inpatient Remains inpatient appropriate because: Comfort care       Subjective:   Interval events noted.  Patient is unable to provide any history because of confusion and lethargy.  Chelsea, RN and Catelyn,RN were at the bedside  Objective:    Vitals:   07/26/22 0800 07/26/22 0822 07/26/22 0900 07/26/22 1000  BP: (!) 72/47  (!) 84/54 (!) 77/52  Pulse: 78 77 77 79  Resp: 15 14 12 10   Temp:      TempSrc:      SpO2:  95% 95% 94% 95%  Weight:      Height:       No data found.   Intake/Output Summary (Last 24 hours) at 07/26/2022 1639 Last data filed at 07/26/2022 [id number] Gross per 24 hour  Intake 742.64 ml  Output 100 ml  Net 642.64 ml   Filed Weights   07/24/22 0500 07/25/22 0205 07/26/22 0353  Weight: 62.1 kg 63 kg 61.9 kg    Exam:  GEN: NAD SKIN: Warm and dry EYES: Pale but anicteric ENT: MMM CV: RRR PULM: Bibasilar rales, no wheezing ABD: soft, ND, NT, +BS CNS: Lethargic.  Speech is slurred and incomprehensible.  Limited exam EXT: Bilateral leg edema, no tenderness.  Left thigh swelling      Data Reviewed:   I have personally reviewed following labs and imaging studies:  Labs: Labs show the following:   Basic Metabolic Panel: Recent Labs  Lab 07/23/22 2156 07/24/22 0443 07/25/22 0442 07/25/22 1758 07/26/22 0528  NA 132* 136 136 138  --   K 3.6 3.1* 3.8 4.0  --   CL 97* 96* 99 98  --   CO2 25 30 29 28   --   GLUCOSE 185* 159* 165* 240*  --   BUN 13 13 19  28*  --   CREATININE 0.51* 0.54* 0.58* 1.00 0.84  CALCIUM 8.3* 8.3* 8.1* 8.5*  --   MG 2.1  --   --   --   --    GFR Estimated Creatinine Clearance: 75.3 mL/min (by C-G formula based on SCr of 0.84 mg/dL). Liver Function Tests: Recent Labs  Lab 07/23/22 2156 07/24/22 0443 07/25/22 1758  AST 21 13* 25  ALT 14 12 19   ALKPHOS 47 41 69  BILITOT 0.8 0.8 0.6  PROT 5.7* 5.6* 5.6*  ALBUMIN 2.5* 2.5* 2.2*   No results for input(s): "LIPASE", "AMYLASE" in the last 168 hours. No results for input(s): "AMMONIA" in the last 168 hours. Coagulation profile No results for input(s): "INR", "PROTIME" in the last 168 hours.  CBC: Recent Labs  Lab 07/23/22 2156 07/24/22 0443 07/25/22 0442 07/25/22 1758 07/26/22 0528  WBC 6.0 5.9 6.4 4.0 8.4  NEUTROABS  --   --  3.9 2.8 5.5  HGB 8.7* 8.9* 8.1* 8.6* 7.0*  HCT 27.0* 27.3* 24.9* 27.5* 21.9*  MCV 95.7 93.2 95.0 96.8 95.2  PLT 144* 153 165 177 148*   Cardiac  Enzymes: No results for input(s): "CKTOTAL", "CKMB", "CKMBINDEX", "TROPONINI" in the last 168 hours. BNP (last 3 results) No results for input(s): "PROBNP" in the last 8760 hours. CBG: Recent Labs  Lab 07/25/22 1638 07/25/22 2109 07/25/22 2310 07/26/22 0352 07/26/22 0738  GLUCAP 196* 175* 164* 143* 125*   D-Dimer: No results for input(s): "DDIMER" in the last 72 hours. Hgb A1c: Recent Labs    07/24/22 0443  HGBA1C 5.8*   Lipid Profile: No  results for input(s): "CHOL", "HDL", "LDLCALC", "TRIG", "CHOLHDL", "LDLDIRECT" in the last 72 hours. Thyroid function studies: No results for input(s): "TSH", "T4TOTAL", "T3FREE", "THYROIDAB" in the last 72 hours.  Invalid input(s): "FREET3" Anemia work up: Recent Labs    07/24/22 0443 07/24/22 1435 07/25/22 0442  VITAMINB12  --   --  191  FOLATE 18.1  --   --   FERRITIN 381*  --   --   TIBC 237*  --   --   IRON 17*  --   --   RETICCTPCT  --  3.4*  --    Sepsis Labs: Recent Labs  Lab 07/24/22 0443 07/25/22 0442 07/25/22 1758 07/25/22 2030 07/26/22 0528  PROCALCITON  --   --  0.64  --  5.69  WBC 5.9 6.4 4.0  --  8.4  LATICACIDVEN  --   --  3.3* 2.0* 1.5    Microbiology Recent Results (from the past 240 hour(s))  Culture, blood (Routine X 2) w Reflex to ID Panel     Status: None (Preliminary result)   Collection Time: 07/25/22  5:58 PM   Specimen: BLOOD  Result Value Ref Range Status   Specimen Description BLOOD RIGHT ANTECUBITAL  Final   Special Requests   Final    BOTTLES DRAWN AEROBIC AND ANAEROBIC Blood Culture adequate volume   Culture   Final    NO GROWTH < 12 HOURS Performed at [facility], [address] [place], [place] [address]    Report Status PENDING  Incomplete  Culture, blood (Routine X 2) w Reflex to ID Panel     Status: None (Preliminary result)   Collection Time: 07/25/22  6:10 PM   Specimen: BLOOD  Result Value Ref Range Status   Specimen Description BLOOD LEFT ANTECUBITAL  Final    Special Requests   Final    BOTTLES DRAWN AEROBIC AND ANAEROBIC Blood Culture adequate volume   Culture   Final    NO GROWTH < 12 HOURS Performed at [facility], [address], [place], [place] [address]    Report Status PENDING  Incomplete    Procedures and diagnostic studies:  DG Chest Port 1 View  Result Date: 07/26/2022 CLINICAL DATA:  [id number] with consolidated pneumonia. EXAM: PORTABLE CHEST 1 VIEW COMPARISON:  CTA chest yesterday at 9:25 p.m., portable chest yesterday at 6:03 p.m. FINDINGS: 5:04 a.m. Suboptimal exam with patient rotated oblique to the left. The patient's chin and neck partially obscure the lateral left upper lung field There is dense consolidation in the left lower lobe and lingula, unchanged from the later study from yesterday but increased since yesterday's film at 6:03 p.m. The remaining lungs are clear with no new consolidation. The cardiomediastinal silhouette and central vasculature are normal. There is at least a minimal left pleural effusion. The right sulci are sharp. No acute osseous findings. IMPRESSION: Dense consolidation in the left lower lobe and lingula, unchanged from the later study from yesterday but increased since yesterday's film at 6:03 PM. Electronically Signed   By: Telford Nab M.D.   On: 07/26/2022 06:44   CT Angio Chest Pulmonary Embolism (PE) W or WO Contrast  Result Date: 07/25/2022 CLINICAL DATA:  Fever, tachycardia, suspected pulmonary embolism. Dyspnea EXAM: CT ANGIOGRAPHY CHEST WITH CONTRAST TECHNIQUE: Multidetector CT imaging of the chest was performed using the standard protocol during bolus administration of intravenous contrast. Multiplanar CT image reconstructions and MIPs were obtained to evaluate the vascular anatomy. RADIATION DOSE REDUCTION: This exam was  performed according to the departmental dose-optimization program which includes automated exposure control, adjustment of the mA and/or kV according to patient size  and/or use of iterative reconstruction technique. CONTRAST:  53mL OMNIPAQUE IOHEXOL 350 MG/ML SOLN COMPARISON:  None Available. FINDINGS: Cardiovascular: Adequate opacification of the pulmonary arterial tree. No intraluminal filling defect identified to suggest acute pulmonary embolism. Central pulmonary arteries are of normal caliber. No significant coronary artery calcification. Cardiac size within normal limits. No pericardial effusion. No significant atherosclerotic calcification within the thoracic aorta. No aortic aneurysm. Mediastinum/Nodes: No enlarged mediastinal, hilar, or axillary lymph nodes. Thyroid gland, trachea, and esophagus demonstrate no significant findings. Lungs/Pleura: There is dense consolidation of the left lower lobe and lingula in keeping with changes of acute lobar pneumonia. There is extensive airway impaction with near complete opacification of the left lower lobar bronchi as well as the lingular segmental bronchi. Diffuse bronchial wall thickening in keeping with airway inflammation as well as diffuse tree-in-bud peribronchial nodularity in keeping with small airway inflammation/impaction no pneumothorax or pleural effusion. Upper Abdomen: No acute abnormality.  Status post cholecystectomy Musculoskeletal: No chest wall abnormality. No acute or significant osseous findings. Review of the MIP images confirms the above findings. IMPRESSION: Extensive airway impaction and dense consolidation of the left lower lobe and lingula in keeping with acute lobar pneumonia in the appropriate clinical setting. Aspiration could appear similarly. Superimposed diffuse airway inflammation. No pulmonary embolism Electronically Signed   By: Fidela Salisbury M.D.   On: 07/25/2022 21:39   DG Chest Port 1 View  Result Date: 07/25/2022 CLINICAL DATA:  Fever EXAM: PORTABLE CHEST 1 VIEW COMPARISON:  07/23/2022 and older FINDINGS: The right inferior costophrenic angle is clipped off the edge of the film.  Overlapping artifacts. Underinflation. There is developing parenchymal opacity along the left mid to lower lung. Possible acute infiltrate. Recommend follow-up. No pneumothorax or edema. Normal cardiopericardial silhouette. Normal cardiopericardial silhouette. IMPRESSION: Developing left mid to lower lung parenchymal opacity. Possible acute infiltrate. Recommend follow-up to confirm clearance Electronically Signed   By: Jill Side M.D.   On: 07/25/2022 18:16               LOS: 2 days   Marshe Shrestha  [facility]   Pager on [email or web address]. If 7PM-7AM, please contact night-coverage at [email or web address]     07/26/2022, 4:39 PM

## 2022-07-26 NOTE — Progress Notes (Signed)
Patient arrived to room. Assessment done. Patient resting comfortably. Call bell within reach. Mother in room and requesting thickened tea. No diet order in so secure chatted the hospitalist on call and got a diet order.

## 2022-07-26 NOTE — Progress Notes (Signed)
BP continues to decline; patient is lethargic. Pt remains on [place] at 3L    07/26/22 0616  Assess: MEWS Score  Temp 99 F (37.2 C)  BP (!) 71/47  MAP (mmHg) (!) 56  Pulse Rate 85  ECG Heart Rate 85  Resp 15  Level of Consciousness Alert  SpO2 91 %  O2 Device Nasal Cannula  O2 Flow Rate (L/min) 3 L/min  Assess: MEWS Score  MEWS Temp 0  MEWS Systolic 2  MEWS Pulse 0  MEWS RR 0  MEWS LOC 0  MEWS Score 2  MEWS Score Color Yellow  Assess: if the MEWS score is Yellow or Red  Were vital signs taken at a resting state? Yes  Focused Assessment No change from prior assessment  Does the patient meet 2 or more of the SIRS criteria? No  Provider and Rapid Response Notified? Yes  MEWS guidelines implemented  No, previously red, continue vital signs every 4 hours  Provider Notification  Provider Name/Title Neomia Glass  Date Provider Notified 07/26/22  Time Provider Notified [phone]  Method of Notification Page  Notification Reason Change in status  Provider response See new orders  Date of Provider Response 07/26/22  Time of Provider Response 0631  Notify: Rapid Response  Name of Rapid Response RN Notified Timothy Lasso RN  Date Rapid Response Notified 07/26/22  Time Rapid Response Notified 0615  Assess: SIRS CRITERIA  SIRS Temperature  0  SIRS Pulse 0  SIRS Respirations  0  SIRS WBC 0  SIRS Score Sum  0

## 2022-07-26 NOTE — Consult Note (Addendum)
NAME:  Daniel Mitchell, MRN:  [id number], DOB:  [birth year], LOS: 2 ADMISSION DATE:  07/23/2022, CONSULTATION DATE:  07/26/2022 REFERRING MD:  Jennye Boroughs, MD, CHIEF COMPLAINT:  Hypotension   History of Present Illness:  Mr. Mongiello is a 57 year old male with a history of intellectual disability, resident of a group home, who presents to the hospital following a fall.   History obtained from chart review and from discussion with his mother. On presentation, he was found to have a left distal femoral fracture for which orthopedics recommends conservative management.  During said hospitalization, he was found to have lower extremity edema for which he received IV diuresis. Patient was also managed with IV antibiotics for suspected aspiration pneumonia given fever and infiltrates on chest imaging.   This morning, the patient was noted to be hypotensive with minimal response to IV fluids. PCCM is consulted to help managed Shock.   Pertinent  Medical History  -intellectual disability -T2DM -HTN -Seizure Disorder -Ataxia -Chronic UTI   Objective   Blood pressure (!) 72/47, pulse 77, temperature 99 F (37.2 C), temperature source Rectal, resp. rate 14, height 5' 1.81" (1.57 m), weight 61.9 kg, SpO2 95 %.        Intake/Output Summary (Last 24 hours) at 07/26/2022 0911 Last data filed at 07/26/2022 [id number] Gross per 24 hour  Intake 745.64 ml  Output 100 ml  Net 645.64 ml   Filed Weights   07/24/22 0500 07/25/22 0205 07/26/22 0353  Weight: 62.1 kg 63 kg 61.9 kg    Examination: Physical Exam Constitutional:      General: He is not in acute distress.    Appearance: He is ill-appearing.  HENT:     Mouth/Throat:     Mouth: Mucous membranes are moist.  Cardiovascular:     Rate and Rhythm: Normal rate and regular rhythm.     Heart sounds: Normal heart sounds.  Pulmonary:     Effort: Pulmonary effort is normal.     Breath sounds: Rhonchi and rales present. No wheezing.   Musculoskeletal:     Right lower leg: Edema present.     Left lower leg: Edema present.  Neurological:     Mental Status: He is disoriented.     Assessment & Plan:   Mr. Fennewald is a 57 year old male with intellectual disability presenting to the hospital with left femoral fracture. He developed severe sepsis likely secondary to aspiration pneumonia.  #Septic Shock #Aspiration Pneumonia #Acute Hypoxic Respiratory Failure #Intellectual Disability #Left Femoral fracture #End of Life Care  Patient developed fever and chills with infiltrates on chest imaging and an elevated procalcitonin. This is consistent with sepsis secondary to aspiration pneumonia. He's been hypotensive not responsive to fluids and was to requiring the initiation of IV nor-epinephrine for vasopressor support consistent with septic shock.  I presented to the patient's bedside and had a prolonged conversation with the patient's mother, who is his power of attorney. The patient is a resident of a group home and his quality of life is very poor. He requires around the clock care and is unable to do any of his activities of daily living. I discussed with the patient's mother what interventions she would want performed for him. She would not want him to be intubated, have CPR, be resuscitated, or undergo dialysis. We also discussed whether she would want him to receive IV blood pressure medicine and she did not. She would like the patient to be comfortable, and not to  have his suffering prolonged. She asked for palliative care and hospice to be involved and consulted in his care.  I have placed a consultation to palliative care and have changed the code status in the medical record. I have also communicated this with Dr. Mal Misty, the attending of record.   Labs   CBC: Recent Labs  Lab 07/23/22 2156 07/24/22 0443 07/25/22 0442 07/25/22 1758 07/26/22 0528  WBC 6.0 5.9 6.4 4.0 8.4  NEUTROABS  --   --  3.9 2.8 5.5  HGB  8.7* 8.9* 8.1* 8.6* 7.0*  HCT 27.0* 27.3* 24.9* 27.5* 21.9*  MCV 95.7 93.2 95.0 96.8 95.2  PLT 144* 153 165 177 148*    Basic Metabolic Panel: Recent Labs  Lab 07/23/22 2156 07/24/22 0443 07/25/22 0442 07/25/22 1758  NA 132* 136 136 138  K 3.6 3.1* 3.8 4.0  CL 97* 96* 99 98  CO2 25 30 29 28   GLUCOSE 185* 159* 165* 240*  BUN 13 13 19  28*  CREATININE 0.51* 0.54* 0.58* 1.00  CALCIUM 8.3* 8.3* 8.1* 8.5*  MG 2.1  --   --   --    GFR: Estimated Creatinine Clearance: 63.2 mL/min (by C-G formula based on SCr of 1 mg/dL). Recent Labs  Lab 07/24/22 0443 07/25/22 0442 07/25/22 1758 07/25/22 2030 07/26/22 0528  PROCALCITON  --   --  0.64  --  5.69  WBC 5.9 6.4 4.0  --  8.4  LATICACIDVEN  --   --  3.3* 2.0* 1.5    Liver Function Tests: Recent Labs  Lab 07/23/22 2156 07/24/22 0443 07/25/22 1758  AST 21 13* 25  ALT 14 12 19   ALKPHOS 47 41 69  BILITOT 0.8 0.8 0.6  PROT 5.7* 5.6* 5.6*  ALBUMIN 2.5* 2.5* 2.2*   No results for input(s): "LIPASE", "AMYLASE" in the last 168 hours. No results for input(s): "AMMONIA" in the last 168 hours.  ABG    Component Value Date/Time   PHART 7.52 (H) 07/25/2022 1743   PCO2ART 39 07/25/2022 1743   PO2ART 58 (L) 07/25/2022 1743   HCO3 31.8 (H) 07/25/2022 1743   O2SAT 89.6 07/25/2022 1743     Coagulation Profile: No results for input(s): "INR", "PROTIME" in the last 168 hours.  Cardiac Enzymes: No results for input(s): "CKTOTAL", "CKMB", "CKMBINDEX", "TROPONINI" in the last 168 hours.  HbA1C: Hgb A1c MFr Bld  Date/Time Value Ref Range Status  07/24/2022 04:43 AM 5.8 (H) 4.8 - 5.6 % Final    Comment:    (NOTE)         Prediabetes: 5.7 - 6.4         Diabetes: >6.4         Glycemic control for adults with diabetes: <7.0     CBG: Recent Labs  Lab 07/25/22 1638 07/25/22 2109 07/25/22 2310 07/26/22 0352 07/26/22 0738  GLUCAP 196* 175* 164* 143* 125*    Review of Systems:   Unable to obtain  Past Medical History:   He,  has a past medical history of Ataxia, Chronic UTI (urinary tract infection), Depression, Developmental disability, Diabetes mellitus type 2, controlled, Hypertension, Penile hypospadias (03/28/14), Seasonal allergies, Seizure disorder, Urethral foreign body (08/05/14), and Urogenital disorder.   Surgical History:   Past Surgical History:  Procedure Laterality Date   CYSTOSCOPY  04/09/14   Status post cystoscopy and removal of calculus   CYSTOSCOPY  09/03/14   Status post cystoscopy with removal of calculus   INGUINAL HERNIA REPAIR  Social History:   reports that he has never smoked. He has never used smokeless tobacco. He reports that he does not drink alcohol and does not use drugs.   Family History:  His family history includes Stroke in his father.   Allergies Allergies  Allergen Reactions   Topamax [Topiramate]    Zithromax [Azithromycin]      Home Medications  Prior to Admission medications   Medication Sig Start Date End Date Taking? Authorizing Provider  acetaminophen (TYLENOL) 500 MG tablet Take 500 mg by mouth every 4 (four) hours as needed.   Yes [provider]  aluminum-magnesium hydroxide 200-200 MG/5ML suspension Take by mouth every 6 (six) hours as needed for indigestion. Take per standard PRN orders.   Yes [provider]  azelastine (ASTELIN) 0.1 % nasal spray Place 2 sprays into both nostrils 2 (two) times daily. Use in each nostril as directed   Yes [provider]  baclofen (LIORESAL) 10 MG tablet Take 10 mg by mouth 3 (three) times daily.   Yes [provider]  cetirizine (ZYRTEC) 10 MG tablet Take 10 mg by mouth daily.   Yes [provider]  Chelated Zinc 50 MG TABS Take 1 tablet by mouth daily. 08/17/21  Yes [provider]  Cholecalciferol (VITAMIN D) 50 MCG (2000 UT) tablet Take 2,000 Units by mouth daily.   Yes [provider]  diclofenac sodium (VOLTAREN) 1 % GEL Apply 2 g topically 3  (three) times daily. To bilateral knees.   Yes [provider]  divalproex (DEPAKOTE ER) 250 MG 24 hr tablet Take 250 mg by mouth 2 (two) times daily. 07/12/22  Yes [provider]  divalproex (DEPAKOTE ER) 500 MG 24 hr tablet Take 500 mg by mouth 2 (two) times daily. 12/22/20  Yes [provider]  docusate sodium (COLACE) 100 MG capsule Take 100 mg by mouth 2 (two) times daily as needed.    Yes [provider]  ferrous sulfate 325 (65 FE) MG tablet Take 325 mg by mouth daily.   Yes [provider]  furosemide (LASIX) 20 MG tablet Take 20 mg by mouth once daily as needed for edema (if not relieved by Torsemide). 07/12/21  Yes [provider]  guaiFENesin-dextromethorphan (ROBITUSSIN DM) 100-10 MG/5ML syrup Take 5 mLs by mouth every 4 (four) hours as needed for cough. Take per standard PRN orders.   Yes [provider]  loperamide (IMODIUM A-D) 2 MG tablet Take 2 mg by mouth 4 (four) times daily as needed for diarrhea or loose stools. Take per standard PRN orders.   Yes [provider]  loratadine (CLARITIN) 10 MG tablet Take 10 mg by mouth daily.   Yes [provider]  magnesium hydroxide (MILK OF MAGNESIA) 400 MG/5ML suspension Take by mouth daily as needed for mild constipation. Take  per standard PRN orders.   Yes [provider]  metFORMIN (GLUCOPHAGE) 500 MG tablet Take 500 mg by mouth 2 (two) times daily with a meal.   Yes [provider]  Olopatadine HCl (PATANASE) 0.6 % SOLN Place into the nose. Nasal spray: Used 2 sprays in each nostril twice daily as needed for nasal congestion.   Yes [provider]  paliperidone (INVEGA) 3 MG 24 hr tablet Take 3 mg by mouth every morning.   Yes [provider]  potassium chloride (KLOR-CON) 10 MEQ tablet Take 10 mEq by mouth every Monday, Wednesday, and Friday. 02/09/22  Yes [provider]  pravastatin (  PRAVACHOL) 20 MG tablet Take 20  mg by mouth at bedtime.   Yes [provider]  risperiDONE (RISPERDAL) 2 MG tablet Take 2 mg by mouth daily as needed (psychosis).   Yes [provider]  sertraline (ZOLOFT) 100 MG tablet Take 100 mg by mouth daily.   Yes [provider]  terbinafine (LAMISIL) 1 % cream Apply 1 application  topically daily. Apply to both feet for fungal rash.   Yes [provider]  tolnaftate (TINACTIN) 1 % spray Apply topically 2 (two) times daily. Take per standard PRN orders.   Yes Kirk Ruths, MD  torsemide [facility]) 5 MG tablet Take 5 mg by mouth every Monday, Wednesday, and Friday.   Yes [provider]     Critical care time: 63 minutes    Armando Reichert, MD [facility] 07/26/2022 9:21 AM

## 2022-07-26 NOTE — Progress Notes (Signed)
Patient continues to have BP systolic [id number], MAP ranging 59-63 on the monitor. Requested transfer orders for stepdown/ICU for dayshift after speaking with ICU charge; NP Foust declines at this time and states she will leave a message for dayshift attending to reevaluate after bolus is complete. No new orders at this time. Charge RN aware.    07/26/22 0649  Vitals  BP (!) 78/48  BP Location Right Arm  BP Method Manual  Patient Position (if appropriate) Lying  Pulse Rate 82  ECG Heart Rate 85  Resp 16  Level of Consciousness  Level of Consciousness Responds to Voice  MEWS COLOR  MEWS Score Color Yellow  Oxygen Therapy  SpO2 92 %  O2 Device Nasal Cannula  O2 Flow Rate (L/min) 6 L/min  MEWS Score  MEWS Temp 0  MEWS Systolic 2  MEWS Pulse 0  MEWS RR 0  MEWS LOC 1  MEWS Score 3

## 2022-07-26 NOTE — Consult Note (Signed)
Pharmacy Antibiotic Note  Daniel Mitchell is a 57 y.o. male admitted on 07/23/2022 with left femoral fx following fall on 07/20/22. Also found to have CHF exacerbation. Developed new fever on 07/25/22.  Pharmacy has been consulted for vancomycin and cefepime dosing for possible sepsis.   Plan: Continue cefepime 2 g q6H.   Pt received vancomycin 1500 mg x 1 followed by 1500 mg q24h. Will adjust to vancomycin 1250 mg q24H Predicted AUC of 509. Goal AUC of 400-600. Scr 1, IBW, Vd 0.72. Plan to obtain vancomycin level after 4th or 5th dose.   Height: 5' 1.81" (157 cm) Weight: 61.9 kg (136 lb 7.4 oz) IBW/kg (Calculated) : 54.17  Temp (24hrs), Avg:101.2 F (38.4 C), Min:99 F (37.2 C), Max:103.3 F (39.6 C)  Recent Labs  Lab 07/23/22 2156 07/24/22 0443 07/25/22 0442 07/25/22 1758 07/25/22 2030 07/26/22 0528  WBC 6.0 5.9 6.4 4.0  --  8.4  CREATININE 0.51* 0.54* 0.58* 1.00  --   --   LATICACIDVEN  --   --   --  3.3* 2.0* 1.5     Estimated Creatinine Clearance: 63.2 mL/min (by C-G formula based on SCr of 1 mg/dL).    Allergies  Allergen Reactions   Topamax [Topiramate]    Zithromax [Azithromycin]     Antimicrobials this admission: 07/25/22 cefepime >>  07/25/22 Vancomycin >>     Microbiology results: 4/1 BCx: sent   Thank you for allowing pharmacy to be a part of this patient's care.  Oswald Hillock, PharmD, BCPS Clinical Pharmacist   07/26/2022 9:04 AM

## 2022-07-27 DIAGNOSIS — Z7189 Other specified counseling: Secondary | ICD-10-CM | POA: Diagnosis not present

## 2022-07-27 DIAGNOSIS — S7292XA Unspecified fracture of left femur, initial encounter for closed fracture: Secondary | ICD-10-CM | POA: Diagnosis not present

## 2022-07-27 DIAGNOSIS — J690 Pneumonitis due to inhalation of food and vomit: Secondary | ICD-10-CM | POA: Diagnosis not present

## 2022-07-27 DIAGNOSIS — I5033 Acute on chronic diastolic (congestive) heart failure: Secondary | ICD-10-CM | POA: Diagnosis not present

## 2022-07-27 NOTE — Progress Notes (Signed)
Manufacturing engineer [facility]) Hospital Liaison Note  Received request from Transitions of Care Manager  [place] for family interest in [facility]. Visited patient at bedside and spoke with mother/Jean to confirm interest and explain services.  Patient has been approved for the Hospice Home. [facility] to follow up once bed available.  Please do not hesitate to call with any hospice related questions.    Thank you for the opportunity to participate in this patient's care.  Phillis Haggis, MSW [facility] Liaison  [phone]

## 2022-07-27 NOTE — Care Management Important Message (Signed)
Important Message  Patient Details  Name: PLUMER NEIN MRN: [id number] Date of Birth: [birth year]   Medicare Important Message Given:  N/A - LOS <3 / Initial given by admissions     Dannette Barbara 07/27/2022, 8:41 AM

## 2022-07-27 NOTE — TOC Progression Note (Signed)
Transition of Care [facility]) - Progression Note    Patient Details  Name: Daniel Mitchell MRN: [id number] Date of Birth: 1966-03-15  Transition of Care [facility]) CM/SW Contact  Gerilyn Pilgrim, LCSW Phone Number: 07/27/2022, 4:17 PM  Clinical Narrative:   Pt and family have chosen [facility] per palliative liaison. Referral made to [facility].     Expected Discharge Plan:  (TBD) Barriers to Discharge: Continued Medical Work up  Expected Discharge Plan and Services     Post Acute Care Choice:  (TBD) Living arrangements for the past 2 months: Group Home                                       Social Determinants of Health (SDOH) Interventions SDOH Screenings   Food Insecurity: No Food Insecurity (07/26/2022)  Housing: Low Risk  (07/26/2022)  Transportation Needs: No Transportation Needs (07/26/2022)  Utilities: Not At Risk (07/26/2022)  Tobacco Use: Low Risk  (07/26/2022)    Readmission Risk Interventions     No data to display

## 2022-07-27 NOTE — Progress Notes (Signed)
PROGRESS NOTE    Daniel Mitchell  [id number] DOB: Feb 26, 1966 DOA: 07/23/2022 PCP: Kirk Ruths, MD  Assessment & Plan:   Principal Problem:   Acute on chronic diastolic CHF (congestive heart failure) Active Problems:   Other fracture of left femur, initial encounter for closed fracture   Edema of both legs   Cognitive disorder   Controlled type 2 diabetes mellitus with complication, without long-term current use of insulin   Type II diabetes mellitus   Unspecified convulsions   Acute respiratory failure with hypoxia   Septic shock   Pneumonia  Assessment and Plan:  Septic shock: see Dr. Gretta Began note on how pt met septic shock criteria. Likely secondary to aspiration pneumonia.   Aspiration pneumonia: comfort care only   Acute hypoxic respiratory failure: continue on supplemental oxygen for comfort only   Left femoral fracture: norco prn. Comfort care only   Thrombocytopenia: etiology unclear. Labile.   Intellectual disability: continue w/ supportive care   DVT prophylaxis:  none, comfort care only  Code Status: DNR Family Communication: discussed pt's care w/ pt's family at bedside and answered their questions Disposition Plan:  comfort care only   Level of care: Med-Surg  Status is: Inpatient Remains inpatient appropriate because: severity of illness, comfort care only     Consultants:  ICU   Procedures:   Antimicrobials:   Subjective: Pt denies any complaints currently  Objective: Vitals:   07/26/22 0900 07/26/22 1000 07/26/22 2008 07/27/22 0738  BP: (!) 84/54 (!) 77/52 (!) 117/56 103/63  Pulse: 77 79 (!) 102 95  Resp: 12 10 16  (!) 24  Temp:   98.4 F (36.9 C) 98.3 F (36.8 C)  TempSrc:   Oral Oral  SpO2: 94% 95% 91% 99%  Weight:      Height:       No intake or output data in the 24 hours ending 07/27/22 0742 Filed Weights   07/24/22 0500 07/25/22 0205 07/26/22 0353  Weight: 62.1 kg 63 kg 61.9 kg    Examination:  General  exam: Appears calm and comfortable  Respiratory system: diminished breath sounds b/l Cardiovascular system: S1 & S2+. No rubs, gallops or clicks. Gastrointestinal system: Abdomen is nondistended, soft and nontender. Hypoactive bowel sounds heard. Central nervous system: Alert and awake Psychiatry: Judgement and insight appears poor. Flat mood and affect    Data Reviewed: I have personally reviewed following labs and imaging studies  CBC: Recent Labs  Lab 07/23/22 2156 07/24/22 0443 07/25/22 0442 07/25/22 1758 07/26/22 0528  WBC 6.0 5.9 6.4 4.0 8.4  NEUTROABS  --   --  3.9 2.8 5.5  HGB 8.7* 8.9* 8.1* 8.6* 7.0*  HCT 27.0* 27.3* 24.9* 27.5* 21.9*  MCV 95.7 93.2 95.0 96.8 95.2  PLT 144* 153 165 177 [id number]*   Basic Metabolic Panel: Recent Labs  Lab 07/23/22 2156 07/24/22 0443 07/25/22 0442 07/25/22 1758 07/26/22 0528  NA 132* 136 136 138  --   K 3.6 3.1* 3.8 4.0  --   CL 97* 96* 99 98  --   CO2 25 30 29 28   --   GLUCOSE 185* 159* 165* 240*  --   BUN 13 13 19  28*  --   CREATININE 0.51* 0.54* 0.58* 1.00 0.84  CALCIUM 8.3* 8.3* 8.1* 8.5*  --   MG 2.1  --   --   --   --    GFR: Estimated Creatinine Clearance: 75.3 mL/min (by C-G formula based on SCr of 0.84 mg/dL). Liver  Function Tests: Recent Labs  Lab 07/23/22 2156 07/24/22 0443 07/25/22 1758  AST 21 13* 25  ALT 14 12 19   ALKPHOS 47 41 69  BILITOT 0.8 0.8 0.6  PROT 5.7* 5.6* 5.6*  ALBUMIN 2.5* 2.5* 2.2*   No results for input(s): "LIPASE", "AMYLASE" in the last 168 hours. No results for input(s): "AMMONIA" in the last 168 hours. Coagulation Profile: No results for input(s): "INR", "PROTIME" in the last 168 hours. Cardiac Enzymes: No results for input(s): "CKTOTAL", "CKMB", "CKMBINDEX", "TROPONINI" in the last 168 hours. BNP (last 3 results) No results for input(s): "PROBNP" in the last 8760 hours. HbA1C: No results for input(s): "HGBA1C" in the last 72 hours. CBG: Recent Labs  Lab 07/25/22 1638  07/25/22 2109 07/25/22 2310 07/26/22 0352 07/26/22 0738  GLUCAP 196* 175* 164* 143* 125*   Lipid Profile: No results for input(s): "CHOL", "HDL", "LDLCALC", "TRIG", "CHOLHDL", "LDLDIRECT" in the last 72 hours. Thyroid Function Tests: No results for input(s): "TSH", "T4TOTAL", "FREET4", "T3FREE", "THYROIDAB" in the last 72 hours. Anemia Panel: Recent Labs    07/24/22 1435 07/25/22 0442  VITAMINB12  --  191  RETICCTPCT 3.4*  --    Sepsis Labs: Recent Labs  Lab 07/25/22 1758 07/25/22 2030 07/26/22 0528  PROCALCITON 0.64  --  5.69  LATICACIDVEN 3.3* 2.0* 1.5    Recent Results (from the past 240 hour(s))  Culture, blood (Routine X 2) w Reflex to ID Panel     Status: None (Preliminary result)   Collection Time: 07/25/22  5:58 PM   Specimen: BLOOD  Result Value Ref Range Status   Specimen Description BLOOD RIGHT ANTECUBITAL  Final   Special Requests   Final    BOTTLES DRAWN AEROBIC AND ANAEROBIC Blood Culture adequate volume   Culture   Final    NO GROWTH < 12 HOURS Performed at [facility], [address] [place], [place] [address]    Report Status PENDING  Incomplete  Culture, blood (Routine X 2) w Reflex to ID Panel     Status: None (Preliminary result)   Collection Time: 07/25/22  6:10 PM   Specimen: BLOOD  Result Value Ref Range Status   Specimen Description BLOOD LEFT ANTECUBITAL  Final   Special Requests   Final    BOTTLES DRAWN AEROBIC AND ANAEROBIC Blood Culture adequate volume   Culture   Final    NO GROWTH < 12 HOURS Performed at [facility], [address], [place], [place] [address]    Report Status PENDING  Incomplete         Radiology Studies: DG Chest Port 1 View  Result Date: 07/26/2022 CLINICAL DATA:  [id number] with consolidated pneumonia. EXAM: PORTABLE CHEST 1 VIEW COMPARISON:  CTA chest yesterday at 9:25 p.m., portable chest yesterday at 6:03 p.m. FINDINGS: 5:04 a.m. Suboptimal exam with patient rotated oblique to the  left. The patient's chin and neck partially obscure the lateral left upper lung field There is dense consolidation in the left lower lobe and lingula, unchanged from the later study from yesterday but increased since yesterday's film at 6:03 p.m. The remaining lungs are clear with no new consolidation. The cardiomediastinal silhouette and central vasculature are normal. There is at least a minimal left pleural effusion. The right sulci are sharp. No acute osseous findings. IMPRESSION: Dense consolidation in the left lower lobe and lingula, unchanged from the later study from yesterday but increased since yesterday's film at 6:03 PM. Electronically Signed   By: Ninfa Linden.D.  On: 07/26/2022 06:44   CT Angio Chest Pulmonary Embolism (PE) W or WO Contrast  Result Date: 07/25/2022 CLINICAL DATA:  Fever, tachycardia, suspected pulmonary embolism. Dyspnea EXAM: CT ANGIOGRAPHY CHEST WITH CONTRAST TECHNIQUE: Multidetector CT imaging of the chest was performed using the standard protocol during bolus administration of intravenous contrast. Multiplanar CT image reconstructions and MIPs were obtained to evaluate the vascular anatomy. RADIATION DOSE REDUCTION: This exam was performed according to the departmental dose-optimization program which includes automated exposure control, adjustment of the mA and/or kV according to patient size and/or use of iterative reconstruction technique. CONTRAST:  27mL OMNIPAQUE IOHEXOL 350 MG/ML SOLN COMPARISON:  None Available. FINDINGS: Cardiovascular: Adequate opacification of the pulmonary arterial tree. No intraluminal filling defect identified to suggest acute pulmonary embolism. Central pulmonary arteries are of normal caliber. No significant coronary artery calcification. Cardiac size within normal limits. No pericardial effusion. No significant atherosclerotic calcification within the thoracic aorta. No aortic aneurysm. Mediastinum/Nodes: No enlarged mediastinal, hilar, or  axillary lymph nodes. Thyroid gland, trachea, and esophagus demonstrate no significant findings. Lungs/Pleura: There is dense consolidation of the left lower lobe and lingula in keeping with changes of acute lobar pneumonia. There is extensive airway impaction with near complete opacification of the left lower lobar bronchi as well as the lingular segmental bronchi. Diffuse bronchial wall thickening in keeping with airway inflammation as well as diffuse tree-in-bud peribronchial nodularity in keeping with small airway inflammation/impaction no pneumothorax or pleural effusion. Upper Abdomen: No acute abnormality.  Status post cholecystectomy Musculoskeletal: No chest wall abnormality. No acute or significant osseous findings. Review of the MIP images confirms the above findings. IMPRESSION: Extensive airway impaction and dense consolidation of the left lower lobe and lingula in keeping with acute lobar pneumonia in the appropriate clinical setting. Aspiration could appear similarly. Superimposed diffuse airway inflammation. No pulmonary embolism Electronically Signed   By: Fidela Salisbury M.D.   On: 07/25/2022 21:39   DG Chest Port 1 View  Result Date: 07/25/2022 CLINICAL DATA:  Fever EXAM: PORTABLE CHEST 1 VIEW COMPARISON:  07/23/2022 and older FINDINGS: The right inferior costophrenic angle is clipped off the edge of the film. Overlapping artifacts. Underinflation. There is developing parenchymal opacity along the left mid to lower lung. Possible acute infiltrate. Recommend follow-up. No pneumothorax or edema. Normal cardiopericardial silhouette. Normal cardiopericardial silhouette. IMPRESSION: Developing left mid to lower lung parenchymal opacity. Possible acute infiltrate. Recommend follow-up to confirm clearance Electronically Signed   By: Jill Side M.D.   On: 07/25/2022 18:16        Scheduled Meds:  sodium chloride flush  3 mL Intravenous Q12H   Continuous Infusions:   LOS: 3 days    Time  spent: 25  mins    Wyvonnia Dusky, MD [facility] Pager [phone]  If 7PM-7AM, please contact night-coverage [email or web address] 07/27/2022, 7:42 AM

## 2022-07-27 NOTE — Consult Note (Addendum)
Consultation Note Date: 07/27/2022   Patient Name: Daniel Mitchell  DOB: December 08, 1965  MRN: [id number]  Age / Sex: 57 y.o., male  PCP: Kirk Ruths, MD Referring Physician: Wyvonnia Dusky, MD  Reason for Consultation: Establishing goals of care  HPI/Patient Profile: Daniel Mitchell is an 57 y.o. male seen in ed for LE edema. Pt had a fall on Wednesday at his facility. Caretaker at bed side. HPI is limited and per staff and nurse and EDMD note.  Patient is prescribed Lasix as needed as part of his home regimen reports leg swelling.  Report patient is currently at his baseline but is less conversive than his usual a.m. patient is alert awake and cooperative.  Emergency room patient has bilateral lower extremity Lasix.pt also noted to be tachycardia. Pt has negative guaiac in ED. TNI is elevated at 44 which is most probably form demand ischemia. Pt has new edema/ anemia/ leg pain.  Stat venous doppler. We will also obtain leg pain.  Clinical Assessment and Goals of Care: Notes and labs reviewed. In to see patient. He is resting in bed with Daniel Mitchell at bedside. She discusses his intellectual disability since a young age. She discusses his wanting to do things like his sister, and so when she want to college, they placed him in a group home.   We discussed his diagnosis, prognosis, GOC, EOL wishes disposition and options.  Created space and opportunity for patient  to explore thoughts and feelings regarding current medical information.   A detailed discussion was had today regarding advanced directives.  Concepts specific to code status, artifical feeding and hydration, IV antibiotics and rehospitalization were discussed.  The difference between an aggressive medical intervention path and a comfort care path was discussed.  Values and goals of care important to patient and family were attempted to be  elicited.  Discussed limitations of medical interventions to prolong quality of life in some situations and discussed the concept of human mortality.  Daniel Mitchell would like patient to go to hospice facility in [place] for end of life care.      SUMMARY OF RECOMMENDATIONS    Daniel Mitchell would like hospice facility placement.   Aspiration PNA.   Femur fracture unrepaired: HGB 7.0 on last check, down from 8.9 on 07/24/22  Prognosis:  Poor      Primary Diagnoses: Present on Admission:  Controlled type 2 diabetes mellitus with complication, without long-term current use of insulin  Edema of both legs  Acute on chronic diastolic CHF (congestive heart failure)  Other fracture of left femur, initial encounter for closed fracture   I have reviewed the medical record, interviewed the patient and family, and examined the patient. The following aspects are pertinent.  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   Urethral foreign body 08/05/14   Dr. Bernardo Heater   Urogenital disorder    Multiple surgeries; H/O Undescended testicle; nocturnal enuresis   Social History   Socioeconomic History   Marital status: Single    Spouse name: Not on file   Number of children: Not on file   Years of education: Not on file   Highest education level: Not on file  Occupational History   Not on file  Tobacco Use   Smoking status: Never   Smokeless tobacco: Never  Substance and Sexual Activity   Alcohol use: No   Drug use: No   Sexual activity: Not on file  Other Topics Concern   Not on file  Social History Narrative   Not on file   Social Determinants of Health    Financial Resource Strain: Not on file  Food Insecurity: No Food Insecurity (07/26/2022)   Hunger Vital Sign    Worried About Running Out of Food in the Last Year: Never true    Ran Out of Food in the Last Year: Never true  Transportation Needs: No Transportation Needs (07/26/2022)   PRAPARE - Hydrologist (Medical): No    Lack of Transportation (Non-Medical): No  Physical Activity: Not on file  Stress: Not on file  Social Connections: Not on file   Family History  Problem Relation Age of Onset   Stroke Father    Scheduled Meds:  sodium chloride flush  3 mL Intravenous Q12H   Continuous Infusions: PRN Meds:.diazepam, HYDROcodone-acetaminophen, morphine injection Medications Prior to Admission:  Prior to Admission medications   Medication Sig Start Date End Date Taking? Authorizing Provider  acetaminophen (TYLENOL) 500 MG tablet Take 500 mg by mouth every 4 (four) hours as needed.   Yes [provider]  aluminum-magnesium hydroxide 200-200 MG/5ML suspension Take by mouth every 6 (six) hours as needed for indigestion. Take per standard PRN orders.   Yes [provider]  azelastine (ASTELIN) 0.1 % nasal spray Place 2 sprays into both nostrils 2 (two) times daily. Use in each nostril as directed   Yes [provider]  baclofen (LIORESAL) 10 MG tablet Take 10 mg by mouth 3 (three) times daily.   Yes [provider]  cetirizine (ZYRTEC) 10 MG tablet Take 10 mg by mouth daily.   Yes [provider]  Chelated Zinc 50 MG TABS Take 1 tablet by mouth daily. 08/17/21  Yes [provider]  Cholecalciferol (VITAMIN D) 50 MCG (2000 UT) tablet Take 2,000 Units by mouth daily.   Yes [provider]  diclofenac sodium (VOLTAREN) 1 % GEL Apply 2 g topically 3 (three) times daily. To bilateral knees.   Yes [provider]  divalproex (DEPAKOTE ER) 250 MG 24 hr tablet Take 250 mg by mouth 2 (two) times  daily. 07/12/22  Yes [provider]  divalproex (DEPAKOTE ER) 500 MG 24 hr tablet Take 500 mg by mouth 2 (two) times daily. 12/22/20  Yes [provider]  docusate sodium (COLACE) 100 MG capsule Take 100 mg by mouth 2 (two) times daily as needed.    Yes [provider]  ferrous sulfate 325 (65 FE) MG tablet Take 325 mg by mouth daily.   Yes [provider]  furosemide (LASIX) 20 MG tablet Take 20 mg by mouth once daily as  needed for edema (if not relieved by Torsemide). 07/12/21  Yes [provider]  guaiFENesin-dextromethorphan (ROBITUSSIN DM) 100-10 MG/5ML syrup Take 5 mLs by mouth every 4 (four) hours as needed for cough. Take per standard PRN orders.   Yes [provider]  loperamide (IMODIUM A-D) 2 MG tablet Take 2 mg by mouth 4 (four) times daily as needed for diarrhea or loose stools. Take per standard PRN orders.   Yes [provider]  loratadine (CLARITIN) 10 MG tablet Take 10 mg by mouth daily.   Yes [provider]  magnesium hydroxide (MILK OF MAGNESIA) 400 MG/5ML suspension Take by mouth daily as needed for mild constipation. Take  per standard PRN orders.   Yes [provider]  metFORMIN (GLUCOPHAGE) 500 MG tablet Take 500 mg by mouth 2 (two) times daily with a meal.   Yes [provider]  Olopatadine HCl (PATANASE) 0.6 % SOLN Place into the nose. Nasal spray: Used 2 sprays in each nostril twice daily as needed for nasal congestion.   Yes [provider]  paliperidone (INVEGA) 3 MG 24 hr tablet Take 3 mg by mouth every morning.   Yes [provider]  potassium chloride (KLOR-CON) 10 MEQ tablet Take 10 mEq by mouth every Monday, Wednesday, and Friday. 02/09/22  Yes [provider]  pravastatin (PRAVACHOL) 20 MG tablet Take 20 mg by mouth at bedtime.   Yes [provider]  risperiDONE (RISPERDAL) 2 MG tablet Take 2 mg by mouth daily as needed (psychosis).   Yes  [provider]  sertraline (ZOLOFT) 100 MG tablet Take 100 mg by mouth daily.   Yes [provider]  terbinafine (LAMISIL) 1 % cream Apply 1 application  topically daily. Apply to both feet for fungal rash.   Yes [provider]  tolnaftate (TINACTIN) 1 % spray Apply topically 2 (two) times daily. Take per standard PRN orders.   Yes Kirk Ruths, MD  torsemide [facility]) 5 MG tablet Take 5 mg by mouth every Monday, Wednesday, and Friday.   Yes [provider]   Allergies  Allergen Reactions   Topamax [Topiramate]    Zithromax [Azithromycin]    Review of Systems  Unable to perform ROS   Physical Exam Constitutional:      Comments: Eyes closed.      Vital Signs: BP 103/63 (BP Location: Left Arm)   Pulse 95   Temp 98.3 F (36.8 C) (Oral)   Resp (!) 24   Ht 5' 1.81" (1.57 m)   Wt 61.9 kg   SpO2 99%   BMI 25.11 kg/m  Pain Scale: Faces POSS *See Group Information*: 1-Acceptable,Awake and alert Pain Score: Asleep   SpO2: SpO2: 99 % O2 Device:SpO2: 99 % O2 Flow Rate: .O2 Flow Rate (L/min): 4 L/min  IO: Intake/output summary:  Intake/Output Summary (Last 24 hours) at 07/27/2022 1606 Last data filed at 07/27/2022 0900 Gross per 24 hour  Intake 120 ml  Output --  Net 120 ml    LBM: Last BM Date : 07/26/22 Baseline Weight: Weight: 62.1 kg Most recent weight: Weight: 61.9 kg      Signed by: Asencion Gowda, NP   Please contact Palliative Medicine Team phone at [phone] for questions and concerns.  For individual provider: See Shea Evans

## 2022-07-28 DIAGNOSIS — Z7189 Other specified counseling: Secondary | ICD-10-CM | POA: Diagnosis not present

## 2022-07-28 DIAGNOSIS — I5033 Acute on chronic diastolic (congestive) heart failure: Secondary | ICD-10-CM | POA: Diagnosis not present

## 2022-07-28 MED ORDER — MORPHINE SULFATE (PF) 2 MG/ML IV SOLN
2.0000 mg | INTRAVENOUS | Status: DC | PRN
  Administered 2022-07-28: 2 mg via INTRAVENOUS
  Filled 2022-07-28: qty 1

## 2022-07-28 MED ORDER — GLYCOPYRROLATE 0.2 MG/ML IJ SOLN
0.1000 mg | Freq: Three times a day (TID) | INTRAMUSCULAR | Status: DC | PRN
  Administered 2022-07-28: 0.1 mg via INTRAVENOUS
  Filled 2022-07-28: qty 1

## 2022-07-28 MED ORDER — MORPHINE SULFATE (PF) 2 MG/ML IV SOLN
2.0000 mg | INTRAVENOUS | Status: DC | PRN
  Administered 2022-07-28 (×3): 2 mg via INTRAVENOUS
  Filled 2022-07-28 (×3): qty 1

## 2022-07-28 MED ORDER — MORPHINE 100MG IN NS 100ML (1MG/ML) PREMIX INFUSION
5.0000 mg/h | INTRAVENOUS | Status: DC
  Administered 2022-07-28 – 2022-07-29 (×2): 5 mg/h via INTRAVENOUS
  Filled 2022-07-28 (×2): qty 100

## 2022-07-28 NOTE — Progress Notes (Signed)
PROGRESS NOTE    Daniel Mitchell  [id number] DOB: [birth year] DOA: 07/23/2022 PCP: Kirk Ruths, MD  Assessment & Plan:   Principal Problem:   Acute on chronic diastolic CHF (congestive heart failure) Active Problems:   Other fracture of left femur, initial encounter for closed fracture   Edema of both legs   Cognitive disorder   Controlled type 2 diabetes mellitus with complication, without long-term current use of insulin   Type II diabetes mellitus   Unspecified convulsions   Acute respiratory failure with hypoxia   Septic shock   Pneumonia  Assessment and Plan:  Septic shock: see Dr. Gretta Began note on how pt met septic shock criteria. Likely secondary to aspiration pneumonia.   Aspiration pneumonia: comfort care only   Acute hypoxic respiratory failure: continue on supplemental oxygen for comfort only   Left femoral fracture: norco prn. Comfort care only   Thrombocytopenia: etiology unclear. Comfort care only   Intellectual disability: continue w/ supportive care  DVT prophylaxis:  none, comfort care only  Code Status: DNR Family Communication: discussed pt's care w/ pt's family at bedside and answered their questions Disposition Plan:  comfort care only   Level of care: Med-Surg  Status is: Inpatient Remains inpatient appropriate because: pt has been accepted to hospice home but waiting on a bed still     Consultants:  ICU   Procedures:   Antimicrobials:   Subjective: Pt c/o fatigue   Objective: Vitals:   07/26/22 1000 07/26/22 2008 07/27/22 0738 07/28/22 0737  BP: (!) 77/52 (!) 117/56 103/63 (!) 96/55  Pulse: 79 (!) 102 95 81  Resp: 10 16 (!) 24 17  Temp:  98.4 F (36.9 C) 98.3 F (36.8 C) 99.4 F (37.4 C)  TempSrc:  Oral Oral Oral  SpO2: 95% 91% 99% 95%  Weight:      Height:        Intake/Output Summary (Last 24 hours) at 07/28/2022 0747 Last data filed at 07/28/2022 0726 Gross per 24 hour  Intake 120 ml  Output 375 ml  Net  -255 ml   Filed Weights   07/24/22 0500 07/25/22 0205 07/26/22 0353  Weight: 62.1 kg 63 kg 61.9 kg    Examination:  General exam: Appears comfortable  Respiratory system: decreased breath sounds b/l  Cardiovascular system: S1/S2+. No rubs or clicks  Gastrointestinal system: Abd is soft, NT, ND & hypoactive bowel sounds. Central nervous system: Awake.  Psychiatry: Judgement and insight appears poor. Flat mood and affect    Data Reviewed: I have personally reviewed following labs and imaging studies  CBC: Recent Labs  Lab 07/23/22 2156 07/24/22 0443 07/25/22 0442 07/25/22 1758 07/26/22 0528  WBC 6.0 5.9 6.4 4.0 8.4  NEUTROABS  --   --  3.9 2.8 5.5  HGB 8.7* 8.9* 8.1* 8.6* 7.0*  HCT 27.0* 27.3* 24.9* 27.5* 21.9*  MCV 95.7 93.2 95.0 96.8 95.2  PLT 144* 153 165 177 [id number]*   Basic Metabolic Panel: Recent Labs  Lab 07/23/22 2156 07/24/22 0443 07/25/22 0442 07/25/22 1758 07/26/22 0528  NA 132* 136 136 138  --   K 3.6 3.1* 3.8 4.0  --   CL 97* 96* 99 98  --   CO2 25 30 29 28   --   GLUCOSE 185* 159* 165* 240*  --   BUN 13 13 19  28*  --   CREATININE 0.51* 0.54* 0.58* 1.00 0.84  CALCIUM 8.3* 8.3* 8.1* 8.5*  --   MG 2.1  --   --   --   --  GFR: Estimated Creatinine Clearance: 75.3 mL/min (by C-G formula based on SCr of 0.84 mg/dL). Liver Function Tests: Recent Labs  Lab 07/23/22 2156 07/24/22 0443 07/25/22 1758  AST 21 13* 25  ALT 14 12 19   ALKPHOS 47 41 69  BILITOT 0.8 0.8 0.6  PROT 5.7* 5.6* 5.6*  ALBUMIN 2.5* 2.5* 2.2*   No results for input(s): "LIPASE", "AMYLASE" in the last 168 hours. No results for input(s): "AMMONIA" in the last 168 hours. Coagulation Profile: No results for input(s): "INR", "PROTIME" in the last 168 hours. Cardiac Enzymes: No results for input(s): "CKTOTAL", "CKMB", "CKMBINDEX", "TROPONINI" in the last 168 hours. BNP (last 3 results) No results for input(s): "PROBNP" in the last 8760 hours. HbA1C: No results for input(s):  "HGBA1C" in the last 72 hours. CBG: Recent Labs  Lab 07/25/22 1638 07/25/22 2109 07/25/22 2310 07/26/22 0352 07/26/22 0738  GLUCAP 196* 175* 164* 143* 125*   Lipid Profile: No results for input(s): "CHOL", "HDL", "LDLCALC", "TRIG", "CHOLHDL", "LDLDIRECT" in the last 72 hours. Thyroid Function Tests: No results for input(s): "TSH", "T4TOTAL", "FREET4", "T3FREE", "THYROIDAB" in the last 72 hours. Anemia Panel: No results for input(s): "VITAMINB12", "FOLATE", "FERRITIN", "TIBC", "IRON", "RETICCTPCT" in the last 72 hours.  Sepsis Labs: Recent Labs  Lab 07/25/22 1758 07/25/22 2030 07/26/22 0528  PROCALCITON 0.64  --  5.69  LATICACIDVEN 3.3* 2.0* 1.5    Recent Results (from the past 240 hour(s))  Culture, blood (Routine X 2) w Reflex to ID Panel     Status: None (Preliminary result)   Collection Time: 07/25/22  5:58 PM   Specimen: BLOOD  Result Value Ref Range Status   Specimen Description BLOOD RIGHT ANTECUBITAL  Final   Special Requests   Final    BOTTLES DRAWN AEROBIC AND ANAEROBIC Blood Culture adequate volume   Culture   Final    NO GROWTH 2 DAYS Performed at [facility], [address], [place], [place] [address]    Report Status PENDING  Incomplete  Culture, blood (Routine X 2) w Reflex to ID Panel     Status: None (Preliminary result)   Collection Time: 07/25/22  6:10 PM   Specimen: BLOOD  Result Value Ref Range Status   Specimen Description BLOOD LEFT ANTECUBITAL  Final   Special Requests   Final    BOTTLES DRAWN AEROBIC AND ANAEROBIC Blood Culture adequate volume   Culture   Final    NO GROWTH 2 DAYS Performed at [facility], [address] [place], [place] [address]    Report Status PENDING  Incomplete         Radiology Studies: No results found.      Scheduled Meds:  sodium chloride flush  3 mL Intravenous Q12H   Continuous Infusions:   LOS: 4 days    Time spent: 25  mins    Wyvonnia Dusky, MD [facility] Pager [phone]  If 7PM-7AM, please contact night-coverage [email or web address] 07/28/2022, 7:47 AM

## 2022-07-28 NOTE — Progress Notes (Addendum)
Daily Progress Note   Patient Name: Daniel Mitchell       Date: 07/28/2022 DOB: June 03, 1965  Age: 57 y.o. MRN#: [id number] Attending Physician: Wyvonnia Dusky, MD Primary Care Physician: Kirk Ruths, MD Admit Date: 07/23/2022  Reason for Consultation/Follow-up: Establishing goals of care  Subjective: Notes and labs reviewed.  In to see patient.  Patient's mother, and a representative from his current group home is at bedside.  Patient's mother is happy that patient was accepted to the hospice facility.  She advises that patient has a wheelchair in the bathroom which is a large wheelchair.  The representative from the group home states she will take this back to the group home.  Patient resting comfortably in bed, no distress noted.  Would premedicate patient for pain secondary to femur fracture prior to transport to hospice facility.  Length of Stay: 4  Current Medications: Scheduled Meds:   sodium chloride flush  3 mL Intravenous Q12H    Continuous Infusions:   PRN Meds: diazepam, HYDROcodone-acetaminophen, morphine injection  Physical Exam Pulmonary:     Effort: Pulmonary effort is normal.  Neurological:     Mental Status: He is alert.             Vital Signs: BP (!) 96/55   Pulse 81   Temp 99.4 F (37.4 C) (Oral)   Resp 17   Ht 5' 1.81" (1.57 m)   Wt 61.9 kg   SpO2 95%   BMI 25.11 kg/m  SpO2: SpO2: 95 % O2 Device: O2 Device: Nasal Cannula O2 Flow Rate: O2 Flow Rate (L/min): 4 L/min  Intake/output summary:  Intake/Output Summary (Last 24 hours) at 07/28/2022 1209 Last data filed at 07/28/2022 [id number] Gross per 24 hour  Intake --  Output 375 ml  Net -375 ml   LBM: Last BM Date : 07/26/22 Baseline Weight: Weight: 62.1 kg Most recent weight: Weight:  61.9 kg  Patient Active Problem List   Diagnosis Date Noted   Septic shock 07/26/2022   Pneumonia 07/26/2022   Acute respiratory failure with hypoxia 07/25/2022   Acute on chronic diastolic CHF (congestive heart failure) 07/24/2022   Other fracture of left femur, initial encounter for closed fracture 07/24/2022   Edema of both legs 04/23/2018   History of external ear infection 02/09/2016   Cognitive  disorder 02/03/2016   Unspecified convulsions 11/05/2013   Controlled type 2 diabetes mellitus with complication, without long-term current use of insulin 10/26/2013   Type II diabetes mellitus 10/26/2013   Chronic periodontitis 09/25/2012    Palliative Care Assessment & Plan    Recommendations/Plan: Per notes patient has been accepted to the hospice facility. Would premedicate patient for pain secondary to femur fracture prior to transport to hospice.  Code Status:    Code Status Orders  (From admission, onward)           Start     Ordered   07/26/22 0923  Do not attempt resuscitation (DNR)  Continuous       Question Answer Comment  If patient has no pulse and is not breathing Do Not Attempt Resuscitation   If patient has a pulse and/or is breathing: Medical Treatment Goals COMFORT MEASURES: Keep clean/warm/dry, use medication by any route; positioning, wound care and other measures to relieve pain/suffering; use oxygen, suction/manual treatment of airway obstruction for comfort; do not transfer unless for comfort needs.   Consent: Discussion documented in EHR or advanced directives reviewed      07/26/22 0922           Code Status History     Date Active Date Inactive Code Status Order ID Comments User Context   07/26/2022 0922 07/26/2022 0922 DNR [id number]  Jennye Boroughs, MD Inpatient   07/24/2022 0005 07/26/2022 0922 Full Code [id number]  Para Skeans, MD ED         Thank you for allowing the Palliative Medicine Team to assist in the care of this  patient.   Asencion Gowda, NP  Please contact Palliative Medicine Team phone at [phone] for questions and concerns.

## 2022-07-28 NOTE — Progress Notes (Signed)
[employer] [facility]) Hospital Liaison Note   Unfortunately, Hospice Home is not able to offer a room today. Family and Darrian/TOC aware hospital liaison will follow up tomorrow or sooner if a room becomes available.    Please do not hesitate to call with any hospice related questions.    Thank you for the opportunity to participate in this patient's care.   Phillis Haggis, MSW [facility] Liaison [phone]

## 2022-07-29 DIAGNOSIS — I5033 Acute on chronic diastolic (congestive) heart failure: Secondary | ICD-10-CM | POA: Diagnosis not present

## 2022-07-29 DIAGNOSIS — R627 Adult failure to thrive: Secondary | ICD-10-CM

## 2022-07-29 DIAGNOSIS — Z515 Encounter for palliative care: Secondary | ICD-10-CM

## 2022-07-29 DIAGNOSIS — J690 Pneumonitis due to inhalation of food and vomit: Secondary | ICD-10-CM | POA: Diagnosis not present

## 2022-07-29 DIAGNOSIS — S7292XA Unspecified fracture of left femur, initial encounter for closed fracture: Secondary | ICD-10-CM | POA: Diagnosis not present

## 2022-07-29 MED ORDER — GLYCOPYRROLATE 0.2 MG/ML IJ SOLN: 0.1000 mg | Freq: Three times a day (TID) | INTRAMUSCULAR | Status: DC | PRN

## 2022-07-29 MED ORDER — DIAZEPAM 5 MG/ML IJ SOLN: 5.0000 mg | INTRAMUSCULAR | Status: DC | PRN

## 2022-07-29 MED ORDER — MORPHINE SULFATE (PF) 4 MG/ML IV SOLN
5.0000 mg | Freq: Once | INTRAVENOUS | Status: AC
  Administered 2022-07-29: 5 mg via INTRAVENOUS
  Filled 2022-07-29: qty 2

## 2022-07-29 MED ORDER — HYDROCODONE-ACETAMINOPHEN 5-325 MG PO TABS: 1.0000 | ORAL_TABLET | ORAL | 0 refills | Status: DC | PRN

## 2022-07-29 MED ORDER — MORPHINE 100MG IN NS 100ML (1MG/ML) PREMIX INFUSION: 5.0000 mg/h | INTRAVENOUS | 0 refills | Status: DC

## 2022-07-29 NOTE — Progress Notes (Signed)
[facility])   Consent forms have been completed.  EMS notified of patient D/C and transport arranged by TOC/Darrian and Attending Physician/Dr. Wilfred Lacy also notified of transport arrangement.   Please send signed DNR form with patient and RN call report to [phone].   Eugenie Birks, MSW [facility] Hospital Liaison [phone]

## 2022-07-29 NOTE — Progress Notes (Signed)
Called report to Hildred Alamin at [facility] house [phone] .

## 2022-07-29 NOTE — Discharge Summary (Signed)
Physician Discharge Summary  Daniel Mitchell:[id number] DOB: 06-23-1965 DOA: 07/23/2022  PCP: Lauro Regulus, MD  Admit date: 07/23/2022 Discharge date: 07/29/2022  Admitted From: home Disposition:  hospice home   Recommendations for Outpatient Follow-up:  Follow up with hospice provider ASAP  Home Health: no Equipment/Devices:  Discharge Condition: hospice CODE STATUS: DNR Diet recommendation: as tolerated   Brief/Interim Summary: HPI was taken from Dr. Irena Cords: Daniel Mitchell is an 57 y.o. male seen in ed for LE edema. Pt had a fall on Wednesday at his facility. Caretaker at bed side. HPI is limited and per staff and nurse and EDMD note.  Patient is prescribed Lasix as needed as part of his home regimen reports leg swelling.  Report patient is currently at his baseline but is less conversive than his usual a.m. patient is alert awake and cooperative.  Emergency room patient has bilateral lower extremity Lasix.pt also noted to be tachycardia. Pt has negative guaiac in ED. TNI is elevated at 44 which is most probably form demand ischemia. Pt has new edema/ anemia/ leg pain.  Stat venous doppler. We will also obtain leg pain.  As per Dr. Myriam Forehand: Daniel Mitchell is a 57 y.o. 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  He was brought to the emergency department on 07/23/2022 because of a fall at the group home that reportedly occurred on 07/20/2022.  Patient remained hypotensive after IV fluids.  Decision was made to transfer him to the ICU for IV Levophed.  Dr. Rennis Petty, intensivist, was consulted to assist with management.  However, his mother and legal guardian decided against transfer to the ICU.  She requested that patient be made DNR and placed  on comfort care measures.  She showed me legal guardianship document to prove that she is the legal guardian.Aggressive treatment has been discontinued and patient has been transition to comfort care.  TOC has been consulted for hospice consult.  Plan of care was discussed with his mother at the bedside. Marcelino Duster, patient's sister, was also updated over the phone.  As per Dr. Mayford Knife 4/3-07/29/22: Pt was made comfort care by Dr. Myriam Forehand on 07/26/22. Comfort care was continued until pt was d/c to hospice home on 07/29/22. For more information, please see previous progress/consult notes.   Discharge Diagnoses:  Principal Problem:   Acute on chronic diastolic CHF (congestive heart failure) Active Problems:   Other fracture of left femur, initial encounter for closed fracture   Edema of both legs   Cognitive disorder   Controlled type 2 diabetes mellitus with complication, without long-term current use of insulin   Type II diabetes mellitus   Unspecified convulsions   Acute respiratory failure with hypoxia   Septic shock   Pneumonia   Hospice care patient   Palliative care patient  Failure to thrive: started on IV morphine yesterday afternoon and will continue. Continue w/ comfort care only    Septic shock: see Dr. Doreene Adas note on how pt met septic shock criteria. Likely secondary to aspiration pneumonia.    Aspiration pneumonia: continue w/ comfort care only    Acute hypoxic respiratory failure: continue w/ supplemental oxygen for comfort only    Left femoral fracture: continue on IV morphine drip. Comfort care only    Thrombocytopenia: etiology unclear. Comfort care only    Intellectual disability: continue w/  supportive care   Discharge Instructions  Discharge Instructions     Diet - low sodium heart healthy   Complete by: As directed    Increase activity slowly   Complete by: As directed       Allergies as of 07/29/2022       Reactions   Topamax [topiramate]    Zithromax  [azithromycin]         Medication List     STOP taking these medications    acetaminophen 500 MG tablet Commonly known as: TYLENOL   aluminum-magnesium hydroxide 200-200 MG/5ML suspension   azelastine 0.1 % nasal spray Commonly known as: ASTELIN   baclofen 10 MG tablet Commonly known as: LIORESAL   cetirizine 10 MG tablet Commonly known as: ZYRTEC   Chelated Zinc 50 MG Tabs   diclofenac sodium 1 % Gel Commonly known as: VOLTAREN   divalproex 250 MG 24 hr tablet Commonly known as: DEPAKOTE ER   divalproex 500 MG 24 hr tablet Commonly known as: DEPAKOTE ER   docusate sodium 100 MG capsule Commonly known as: COLACE   ferrous sulfate 325 (65 FE) MG tablet   furosemide 20 MG tablet Commonly known as: LASIX   guaiFENesin-dextromethorphan 100-10 MG/5ML syrup Commonly known as: ROBITUSSIN DM   loperamide 2 MG tablet Commonly known as: IMODIUM A-D   loratadine 10 MG tablet Commonly known as: CLARITIN   magnesium hydroxide 400 MG/5ML suspension Commonly known as: MILK OF MAGNESIA   metFORMIN 500 MG tablet Commonly known as: GLUCOPHAGE   paliperidone 3 MG 24 hr tablet Commonly known as: INVEGA   Patanase 0.6 % Soln Generic drug: Olopatadine HCl   potassium chloride 10 MEQ tablet Commonly known as: KLOR-CON   pravastatin 20 MG tablet Commonly known as: PRAVACHOL   risperiDONE 2 MG tablet Commonly known as: RISPERDAL   sertraline 100 MG tablet Commonly known as: ZOLOFT   terbinafine 1 % cream Commonly known as: LAMISIL   tolnaftate 1 % spray Commonly known as: TINACTIN   torsemide 5 MG tablet Commonly known as: DEMADEX   Vitamin D 50 MCG (2000 UT) tablet       TAKE these medications    diazepam 5 MG/ML injection Commonly known as: VALIUM Inject 1 mL (5 mg total) into the vein every 4 (four) hours as needed.   glycopyrrolate 0.2 MG/ML injection Commonly known as: ROBINUL Inject 0.5 mLs (0.1 mg total) into the vein 3 (three) times  daily as needed (excessive secretions).   HYDROcodone-acetaminophen 5-325 MG tablet Commonly known as: NORCO/VICODIN Take 1 tablet by mouth every 4 (four) hours as needed for moderate pain.   morphine 1 mg/mL Soln infusion Inject 5 mg/hr into the vein continuous.        Allergies  Allergen Reactions   Topamax [Topiramate]    Zithromax [Azithromycin]     Consultations: Palliative care hospice   Procedures/Studies: DG Chest Port 1 View  Result Date: 07/26/2022 CLINICAL DATA:  [id number] with consolidated pneumonia. EXAM: PORTABLE CHEST 1 VIEW COMPARISON:  CTA chest yesterday at 9:25 p.m., portable chest yesterday at 6:03 p.m. FINDINGS: 5:04 a.m. Suboptimal exam with patient rotated oblique to the left. The patient's chin and neck partially obscure the lateral left upper lung field There is dense consolidation in the left lower lobe and lingula, unchanged from the later study from yesterday but increased since yesterday's film at 6:03 p.m. The remaining lungs are clear with no new consolidation. The cardiomediastinal silhouette and central vasculature are normal. There is at  least a minimal left pleural effusion. The right sulci are sharp. No acute osseous findings. IMPRESSION: Dense consolidation in the left lower lobe and lingula, unchanged from the later study from yesterday but increased since yesterday's film at 6:03 PM. Electronically Signed   By: Almira BarKeith  Chesser M.D.   On: 07/26/2022 06:44   CT Angio Chest Pulmonary Embolism (PE) W or WO Contrast  Result Date: 07/25/2022 CLINICAL DATA:  Fever, tachycardia, suspected pulmonary embolism. Dyspnea EXAM: CT ANGIOGRAPHY CHEST WITH CONTRAST TECHNIQUE: Multidetector CT imaging of the chest was performed using the standard protocol during bolus administration of intravenous contrast. Multiplanar CT image reconstructions and MIPs were obtained to evaluate the vascular anatomy. RADIATION DOSE REDUCTION: This exam was performed according to the  departmental dose-optimization program which includes automated exposure control, adjustment of the mA and/or kV according to patient size and/or use of iterative reconstruction technique. CONTRAST:  75mL OMNIPAQUE IOHEXOL 350 MG/ML SOLN COMPARISON:  None Available. FINDINGS: Cardiovascular: Adequate opacification of the pulmonary arterial tree. No intraluminal filling defect identified to suggest acute pulmonary embolism. Central pulmonary arteries are of normal caliber. No significant coronary artery calcification. Cardiac size within normal limits. No pericardial effusion. No significant atherosclerotic calcification within the thoracic aorta. No aortic aneurysm. Mediastinum/Nodes: No enlarged mediastinal, hilar, or axillary lymph nodes. Thyroid gland, trachea, and esophagus demonstrate no significant findings. Lungs/Pleura: There is dense consolidation of the left lower lobe and lingula in keeping with changes of acute lobar pneumonia. There is extensive airway impaction with near complete opacification of the left lower lobar bronchi as well as the lingular segmental bronchi. Diffuse bronchial wall thickening in keeping with airway inflammation as well as diffuse tree-in-bud peribronchial nodularity in keeping with small airway inflammation/impaction no pneumothorax or pleural effusion. Upper Abdomen: No acute abnormality.  Status post cholecystectomy Musculoskeletal: No chest wall abnormality. No acute or significant osseous findings. Review of the MIP images confirms the above findings. IMPRESSION: Extensive airway impaction and dense consolidation of the left lower lobe and lingula in keeping with acute lobar pneumonia in the appropriate clinical setting. Aspiration could appear similarly. Superimposed diffuse airway inflammation. No pulmonary embolism Electronically Signed   By: Helyn NumbersAshesh  Parikh M.D.   On: 07/25/2022 21:39   DG Chest Port 1 View  Result Date: 07/25/2022 CLINICAL DATA:  Fever EXAM: PORTABLE  CHEST 1 VIEW COMPARISON:  07/23/2022 and older FINDINGS: The right inferior costophrenic angle is clipped off the edge of the film. Overlapping artifacts. Underinflation. There is developing parenchymal opacity along the left mid to lower lung. Possible acute infiltrate. Recommend follow-up. No pneumothorax or edema. Normal cardiopericardial silhouette. Normal cardiopericardial silhouette. IMPRESSION: Developing left mid to lower lung parenchymal opacity. Possible acute infiltrate. Recommend follow-up to confirm clearance Electronically Signed   By: Karen KaysAshok  Gupta M.D.   On: 07/25/2022 18:16   ECHOCARDIOGRAM COMPLETE  Result Date: 07/24/2022    ECHOCARDIOGRAM REPORT   Patient Name:   Roderic OvensCHARLES B Mitchell Date of Exam: 07/24/2022 Medical Rec #:  [id number]         Height:       63.0 in Accession #:    [id number]        Weight:       136.9 lb Date of Birth:  04/15/1966         BSA:          1.646 m Patient Age:    56 years          BP:  142/85 mmHg Patient Gender: M                 HR:           111 bpm. Exam Location:  [facility] Procedure: 2D Echo and Intracardiac Opacification Agent Indications:     Bilateral Edema  History:         Patient has no prior history of Echocardiogram examinations.  Sonographer:     Overton Mam RDCS Referring Phys:  [id number] Eliezer Mccoy PATEL Diagnosing Phys: Debbe Odea MD  Sonographer Comments: Technically difficult study due to poor echo windows, no subcostal window and suboptimal apical window. Image acquisition challenging due to patient body habitus. IMPRESSIONS  1. Left ventricular ejection fraction, by estimation, is 65 to 70%. The left ventricle has normal function. The left ventricle has no regional wall motion abnormalities. Left ventricular diastolic parameters are consistent with Grade I diastolic dysfunction (impaired relaxation).  2. Right ventricular systolic function is normal. The right ventricular size is normal.  3. The mitral valve is normal in structure. No  evidence of mitral valve regurgitation.  4. The aortic valve is tricuspid. Aortic valve regurgitation is not visualized.  5. Aortic dilatation noted. There is mild dilatation of the aortic root, measuring 39 mm. FINDINGS  Left Ventricle: Left ventricular ejection fraction, by estimation, is 65 to 70%. The left ventricle has normal function. The left ventricle has no regional wall motion abnormalities. Definity contrast agent was given IV to delineate the left ventricular  endocardial borders. The left ventricular internal cavity size was normal in size. There is no left ventricular hypertrophy. Left ventricular diastolic parameters are consistent with Grade I diastolic dysfunction (impaired relaxation). Right Ventricle: The right ventricular size is normal. No increase in right ventricular wall thickness. Right ventricular systolic function is normal. Left Atrium: Left atrial size was normal in size. Right Atrium: Right atrial size was normal in size. Pericardium: There is no evidence of pericardial effusion. Mitral Valve: The mitral valve is normal in structure. No evidence of mitral valve regurgitation. Tricuspid Valve: The tricuspid valve is normal in structure. Tricuspid valve regurgitation is not demonstrated. Aortic Valve: The aortic valve is tricuspid. Aortic valve regurgitation is not visualized. Aortic valve peak gradient measures 6.0 mmHg. Pulmonic Valve: The pulmonic valve was normal in structure. Pulmonic valve regurgitation is not visualized. Aorta: Aortic dilatation noted. There is mild dilatation of the aortic root, measuring 39 mm. Venous: The inferior vena cava was not well visualized. IAS/Shunts: No atrial level shunt detected by color flow Doppler.  LEFT VENTRICLE PLAX 2D LVIDd:         4.20 cm     Diastology LVIDs:         3.00 cm     LV e' medial:    6.53 cm/s LV PW:         1.00 cm     LV E/e' medial:  9.2 LV IVS:        0.90 cm     LV e' lateral:   10.40 cm/s LVOT diam:     2.00 cm     LV E/e'  lateral: 5.8 LV SV:         76 LV SV Index:   46 LVOT Area:     3.14 cm  LV Volumes (MOD) LV vol d, MOD A4C: 56.8 ml LV vol s, MOD A4C: 21.7 ml LV SV MOD A4C:     56.8 ml RIGHT VENTRICLE RV Basal diam:  2.60 cm  RV S prime:     15.90 cm/s TAPSE (M-mode): 1.7 cm LEFT ATRIUM           Index        RIGHT ATRIUM          Index LA diam:      2.60 cm 1.58 cm/m   RA Area:     6.66 cm LA Vol (A2C): 10.9 ml 6.62 ml/m   RA Volume:   11.40 ml 6.93 ml/m LA Vol (A4C): 16.7 ml 10.15 ml/m  AORTIC VALVE                 PULMONIC VALVE AV Area (Vmax): 2.83 cm     PV Vmax:        1.04 m/s AV Vmax:        122.00 cm/s  PV Peak grad:   4.3 mmHg AV Peak Grad:   6.0 mmHg     RVOT Peak grad: 3 mmHg LVOT Vmax:      110.00 cm/s LVOT Vmean:     77.200 cm/s LVOT VTI:       0.241 m  AORTA Ao Root diam: 3.90 cm Ao Asc diam:  2.90 cm MITRAL VALVE               TRICUSPID VALVE MV Area (PHT): 6.32 cm    TR Peak grad:   23.4 mmHg MV Decel Time: 120 msec    TR Vmax:        242.00 cm/s MV E velocity: 60.30 cm/s MV A velocity: 87.50 cm/s  SHUNTS MV E/A ratio:  0.69        Systemic VTI:  0.24 m                            Systemic Diam: 2.00 cm Debbe Odea MD Electronically signed by Debbe Odea MD Signature Date/Time: 07/24/2022/1:48:58 PM    Final    US Venous Img Lower Bilateral (DVT)  Result Date: 07/24/2022 CLINICAL DATA:  Bilateral lower extremity pain and edema. EXAM: BILATERAL LOWER EXTREMITY VENOUS DOPPLER ULTRASOUND TECHNIQUE: Gray-scale sonography with graded compression, as well as color Doppler and duplex ultrasound were performed to evaluate the lower extremity deep venous systems from the level of the common femoral vein and including the common femoral, femoral, profunda femoral, popliteal and calf veins including the posterior tibial, peroneal and gastrocnemius veins when visible. The superficial great saphenous vein was also interrogated. Spectral Doppler was utilized to evaluate flow at rest and with distal  augmentation maneuvers in the common femoral, femoral and popliteal veins. COMPARISON:  Prior left lower extremity study on 07/09/2021 and bilateral study on 03/19/2016 FINDINGS: RIGHT LOWER EXTREMITY Common Femoral Vein: No evidence of thrombus. Normal compressibility, respiratory phasicity and response to augmentation. Saphenofemoral Junction: No evidence of thrombus. Normal compressibility and flow on color Doppler imaging. Profunda Femoral Vein: No evidence of thrombus. Normal compressibility and flow on color Doppler imaging. Femoral Vein: No evidence of thrombus. Normal compressibility, respiratory phasicity and response to augmentation. Popliteal Vein: No evidence of thrombus. Normal compressibility, respiratory phasicity and response to augmentation. Calf Veins: No evidence of thrombus. Normal compressibility and flow on color Doppler imaging. Superficial Great Saphenous Vein: No evidence of thrombus. Normal compressibility. Venous Reflux:  None. Other Findings: No evidence of superficial thrombophlebitis or abnormal fluid collection. LEFT LOWER EXTREMITY Common Femoral Vein: No evidence of thrombus. Normal compressibility, respiratory phasicity and response to augmentation. Saphenofemoral Junction: No evidence  of thrombus. Normal compressibility and flow on color Doppler imaging. Profunda Femoral Vein: No evidence of thrombus. Normal compressibility and flow on color Doppler imaging. Femoral Vein: No evidence of thrombus. Normal compressibility, respiratory phasicity and response to augmentation. Popliteal Vein: No evidence of thrombus. Normal compressibility, respiratory phasicity and response to augmentation. Calf Veins: No evidence of thrombus. Normal compressibility and flow on color Doppler imaging. Superficial Great Saphenous Vein: No evidence of thrombus. Normal compressibility. Venous Reflux:  None. Other Findings: No evidence of superficial thrombophlebitis or abnormal fluid collection. IMPRESSION:  No evidence of deep venous thrombosis in either lower extremity. Electronically Signed   By: Irish LackGlenn  Yamagata M.D.   On: 07/24/2022 09:23   CT HEAD WO CONTRAST (5MM)  Result Date: 07/23/2022 CLINICAL DATA:  Larey SeatFell on Wednesday, head trauma EXAM: CT HEAD WITHOUT CONTRAST TECHNIQUE: Contiguous axial images were obtained from the base of the skull through the vertex without intravenous contrast. RADIATION DOSE REDUCTION: This exam was performed according to the departmental dose-optimization program which includes automated exposure control, adjustment of the mA and/or kV according to patient size and/or use of iterative reconstruction technique. COMPARISON:  02/16/2018 FINDINGS: Brain: No acute infarct or hemorrhage. Lateral ventricles and midline structures are unremarkable. Age advanced cerebral and cerebellar atrophy. No acute extra-axial fluid collections. No mass effect. Vascular: No hyperdense vessel or unexpected calcification. Skull: Normal. Negative for fracture or focal lesion. Sinuses/Orbits: No acute finding. Other: None. IMPRESSION: 1. No acute intracranial process. Electronically Signed   By: Sharlet SalinaMichael  Brown M.D.   On: 07/23/2022 23:22   DG Knee Right Port  Result Date: 07/23/2022 CLINICAL DATA:  Fall, pain EXAM: PORTABLE RIGHT KNEE - 1-2 VIEW COMPARISON:  02/23/2014 FINDINGS: There is deformity within the distal left femur which appears to be related to old healed fracture. Moderate tricompartment degenerative changes. Moderate joint effusion. No subluxation or dislocation. IMPRESSION: Apparent old healed distal femoral fracture with deformity, degenerative changes and moderate joint effusion. No definite acute bony abnormality. Electronically Signed   By: Charlett NoseKevin  Dover M.D.   On: 07/23/2022 23:22   DG Knee Left Port  Result Date: 07/23/2022 CLINICAL DATA:  Fall, pain EXAM: PORTABLE LEFT KNEE - 1-2 VIEW COMPARISON:  07/09/2021 FINDINGS: There is a comminuted fracture noted through the distal left  femoral metaphysis, possibly extending intra-articular into the knee joint. Mild displacement and angulation. Associated joint effusion. No subluxation or dislocation. IMPRESSION: Comminuted, mildly displaced and angulated distal left femoral fracture with probable intra-articular extension. Associated joint effusion. Electronically Signed   By: Charlett NoseKevin  Dover M.D.   On: 07/23/2022 23:21   DG Chest Portable 1 View  Result Date: 07/23/2022 CLINICAL DATA:  Peripheral edema EXAM: PORTABLE CHEST 1 VIEW COMPARISON:  03/19/2016 FINDINGS: Mild left basilar atelectasis or scarring. Lungs are otherwise clear. No pneumothorax or pleural effusion. Cardiac size within normal limits. No acute bone abnormality. IMPRESSION: 1. Mild left basilar atelectasis or scarring. Electronically Signed   By: Helyn NumbersAshesh  Parikh M.D.   On: 07/23/2022 22:30   (Echo, Carotid, EGD, Colonoscopy, ERCP)    Subjective: Pt is very lethargic but appears comfortable    Discharge Exam: Vitals:   07/27/22 0738 07/28/22 0737  BP: 103/63 (!) 96/55  Pulse: 95 81  Resp: (!) 24 17  Temp: 98.3 F (36.8 C) 99.4 F (37.4 C)  SpO2: 99% 95%   Vitals:   07/26/22 1000 07/26/22 2008 07/27/22 0738 07/28/22 0737  BP: (!) 77/52 (!) 117/56 103/63 (!) 96/55  Pulse: 79 (!) 102 95 81  Resp: 10 16 (!) 24 17  Temp:  98.4 F (36.9 C) 98.3 F (36.8 C) 99.4 F (37.4 C)  TempSrc:  Oral Oral Oral  SpO2: 95% 91% 99% 95%  Weight:      Height:        General exam: Appears comfortable  Respiratory system: diminished breath sounds b/l  Cardiovascular system: S1 & S2+. No rubs or clicks  Gastrointestinal system: Abd is soft, NT, ND & hypoactive bowel sounds  Central nervous system: Lethargic Psychiatry: Judgement and insight appears poor     The results of significant diagnostics from this hospitalization (including imaging, microbiology, ancillary and laboratory) are listed below for reference.     Microbiology: Recent Results (from the past  240 hour(s))  Culture, blood (Routine X 2) w Reflex to ID Panel     Status: None (Preliminary result)   Collection Time: 07/25/22  5:58 PM   Specimen: BLOOD  Result Value Ref Range Status   Specimen Description BLOOD RIGHT ANTECUBITAL  Final   Special Requests   Final    BOTTLES DRAWN AEROBIC AND ANAEROBIC Blood Culture adequate volume   Culture   Final    NO GROWTH 4 DAYS Performed at [facility], [address] [place], [place] [address]    Report Status PENDING  Incomplete  Culture, blood (Routine X 2) w Reflex to ID Panel     Status: None (Preliminary result)   Collection Time: 07/25/22  6:10 PM   Specimen: BLOOD  Result Value Ref Range Status   Specimen Description BLOOD LEFT ANTECUBITAL  Final   Special Requests   Final    BOTTLES DRAWN AEROBIC AND ANAEROBIC Blood Culture adequate volume   Culture   Final    NO GROWTH 4 DAYS Performed at [facility], [address] [place], [place] [address]    Report Status PENDING  Incomplete     Labs: BNP (last 3 results) Recent Labs    07/23/22 2156  BNP 53.8   Basic Metabolic Panel: Recent Labs  Lab 07/23/22 2156 07/24/22 0443 07/25/22 0442 07/25/22 1758 07/26/22 0528  NA 132* 136 136 138  --   K 3.6 3.1* 3.8 4.0  --   CL 97* 96* 99 98  --   CO2 25 30 29 28   --   GLUCOSE 185* 159* 165* 240*  --   BUN 13 13 19  28*  --   CREATININE 0.51* 0.54* 0.58* 1.00 0.84  CALCIUM 8.3* 8.3* 8.1* 8.5*  --   MG 2.1  --   --   --   --    Liver Function Tests: Recent Labs  Lab 07/23/22 2156 07/24/22 0443 07/25/22 1758  AST 21 13* 25  ALT 14 12 19   ALKPHOS 47 41 69  BILITOT 0.8 0.8 0.6  PROT 5.7* 5.6* 5.6*  ALBUMIN 2.5* 2.5* 2.2*   No results for input(s): "LIPASE", "AMYLASE" in the last 168 hours. No results for input(s): "AMMONIA" in the last 168 hours. CBC: Recent Labs  Lab 07/23/22 2156 07/24/22 0443 07/25/22 0442 07/25/22 1758 07/26/22 0528  WBC 6.0 5.9 6.4 4.0 8.4  NEUTROABS  --   --   3.9 2.8 5.5  HGB 8.7* 8.9* 8.1* 8.6* 7.0*  HCT 27.0* 27.3* 24.9* 27.5* 21.9*  MCV 95.7 93.2 95.0 96.8 95.2  PLT 144* 153 165 177 148*   Cardiac Enzymes: No results for input(s): "CKTOTAL", "CKMB", "CKMBINDEX", "TROPONINI" in the last 168 hours. BNP: Invalid input(s): "POCBNP" CBG: Recent  Labs  Lab 07/25/22 1638 07/25/22 2109 07/25/22 2310 07/26/22 0352 07/26/22 0738  GLUCAP 196* 175* 164* 143* 125*   D-Dimer No results for input(s): "DDIMER" in the last 72 hours. Hgb A1c No results for input(s): "HGBA1C" in the last 72 hours. Lipid Profile No results for input(s): "CHOL", "HDL", "LDLCALC", "TRIG", "CHOLHDL", "LDLDIRECT" in the last 72 hours. Thyroid function studies No results for input(s): "TSH", "T4TOTAL", "T3FREE", "THYROIDAB" in the last 72 hours.  Invalid input(s): "FREET3" Anemia work up No results for input(s): "VITAMINB12", "FOLATE", "FERRITIN", "TIBC", "IRON", "RETICCTPCT" in the last 72 hours. Urinalysis    Component Value Date/Time   COLORURINE YELLOW (A) 07/25/2022 0410   APPEARANCEUR HAZY (A) 07/25/2022 0410   APPEARANCEUR Turbid 04/14/2014 1920   LABSPEC 1.022 07/25/2022 0410   LABSPEC 1.027 04/14/2014 1920   PHURINE 5.0 07/25/2022 0410   GLUCOSEU >=500 (A) 07/25/2022 0410   GLUCOSEU Negative 04/14/2014 1920   HGBUR NEGATIVE 07/25/2022 0410   BILIRUBINUR NEGATIVE 07/25/2022 0410   BILIRUBINUR Negative 04/14/2014 1920   KETONESUR 20 (A) 07/25/2022 0410   PROTEINUR NEGATIVE 07/25/2022 0410   NITRITE NEGATIVE 07/25/2022 0410   LEUKOCYTESUR MODERATE (A) 07/25/2022 0410   LEUKOCYTESUR 3+ 04/14/2014 1920   Sepsis Labs Recent Labs  Lab 07/24/22 0443 07/25/22 0442 07/25/22 1758 07/26/22 0528  WBC 5.9 6.4 4.0 8.4   Microbiology Recent Results (from the past 240 hour(s))  Culture, blood (Routine X 2) w Reflex to ID Panel     Status: None (Preliminary result)   Collection Time: 07/25/22  5:58 PM   Specimen: BLOOD  Result Value Ref Range Status    Specimen Description BLOOD RIGHT ANTECUBITAL  Final   Special Requests   Final    BOTTLES DRAWN AEROBIC AND ANAEROBIC Blood Culture adequate volume   Culture   Final    NO GROWTH 4 DAYS Performed at [facility], [address], [place], [place] [address]    Report Status PENDING  Incomplete  Culture, blood (Routine X 2) w Reflex to ID Panel     Status: None (Preliminary result)   Collection Time: 07/25/22  6:10 PM   Specimen: BLOOD  Result Value Ref Range Status   Specimen Description BLOOD LEFT ANTECUBITAL  Final   Special Requests   Final    BOTTLES DRAWN AEROBIC AND ANAEROBIC Blood Culture adequate volume   Culture   Final    NO GROWTH 4 DAYS Performed at [facility], [address], [place], [place] [address]    Report Status PENDING  Incomplete     Time coordinating discharge: Over 30 minutes  SIGNED:   Charise Killian, MD  [facility] 07/29/2022, 5:32 PM Pager   If 7PM-7AM, please contact night-coverage [email or web address]

## 2022-07-29 NOTE — TOC Transition Note (Signed)
Transition of Care [facility]) - CM/SW Discharge Note   Patient Details  Name: Daniel Mitchell MRN: [id number] Date of Birth: March 27, 1966  [facility] CM/SW Contact:  Allena Katz, LCSW Phone Number: 07/29/2022, 4:10 PM   Clinical Narrative:   Pt to discharge to [facility] hospice house today. DNR on the chart, RN given number for report. Family to be notified. Medical Necessity printed to unit. CSW signing off.       Barriers to Discharge: Continued Medical Work up   Patient Goals and CMS Choice      Discharge Placement                         Discharge Plan and Services Additional resources added to the After Visit Summary for       Post Acute Care Choice:  (TBD)                               Social Determinants of Health (SDOH) Interventions SDOH Screenings   Food Insecurity: No Food Insecurity (07/26/2022)  Housing: Low Risk  (07/26/2022)  Transportation Needs: No Transportation Needs (07/26/2022)  Utilities: Not At Risk (07/26/2022)  Tobacco Use: Low Risk  (07/26/2022)     Readmission Risk Interventions     No data to display

## 2022-07-29 NOTE — Progress Notes (Signed)
PROGRESS NOTE    Daniel OvensCharles B Mitchell  AVW:[id number] DOB: 05/10/1965 DOA: 07/23/2022 PCP: Lauro RegulusAnderson, Marshall W, MD  Assessment & Plan:   Principal Problem:   Acute on chronic diastolic CHF (congestive heart failure) Active Problems:   Other fracture of left femur, initial encounter for closed fracture   Edema of both legs   Cognitive disorder   Controlled type 2 diabetes mellitus with complication, without long-term current use of insulin   Type II diabetes mellitus   Unspecified convulsions   Acute respiratory failure with hypoxia   Septic shock   Pneumonia  Assessment and Plan: Failure to thrive: started on IV morphine yesterday afternoon and will continue. Continue w/ comfort care only   Septic shock: see Dr. Doreene Adasgayli's note on how pt met septic shock criteria. Likely secondary to aspiration pneumonia.   Aspiration pneumonia: continue w/ comfort care only   Acute hypoxic respiratory failure: continue w/ supplemental oxygen for comfort only   Left femoral fracture: continue on IV morphine drip. Comfort care only   Thrombocytopenia: etiology unclear. Comfort care only   Intellectual disability: continue w/ supportive care   DVT prophylaxis:  none, comfort care only  Code Status: DNR Family Communication: discussed pt's care w/ pt's family at bedside and answered their questions Disposition Plan:  comfort care only   Level of care: Med-Surg  Status is: Inpatient Remains inpatient appropriate because: pt has been accepted to hospice home but waiting on a bed still     Consultants:  ICU   Procedures:   Antimicrobials:   Subjective: Pt is very lethargic but appears comfortable   Objective: Vitals:   07/26/22 1000 07/26/22 2008 07/27/22 0738 07/28/22 0737  BP: (!) 77/52 (!) 117/56 103/63 (!) 96/55  Pulse: 79 (!) 102 95 81  Resp: 10 16 (!) 24 17  Temp:  98.4 F (36.9 C) 98.3 F (36.8 C) 99.4 F (37.4 C)  TempSrc:  Oral Oral Oral  SpO2: 95% 91% 99% 95%   Weight:      Height:        Intake/Output Summary (Last 24 hours) at 07/29/2022 0740 Last data filed at 07/29/2022 0511 Gross per 24 hour  Intake --  Output 400 ml  Net -400 ml   Filed Weights   07/24/22 0500 07/25/22 0205 07/26/22 0353  Weight: 62.1 kg 63 kg 61.9 kg    Examination:  General exam: Appears comfortable  Respiratory system: diminished breath sounds b/l  Cardiovascular system: S1 & S2+. No rubs or clicks  Gastrointestinal system: Abd is soft, NT, ND & hypoactive bowel sounds  Central nervous system: Lethargic Psychiatry: Judgement and insight appears poor     Data Reviewed: I have personally reviewed following labs and imaging studies  CBC: Recent Labs  Lab 07/23/22 2156 07/24/22 0443 07/25/22 0442 07/25/22 1758 07/26/22 0528  WBC 6.0 5.9 6.4 4.0 8.4  NEUTROABS  --   --  3.9 2.8 5.5  HGB 8.7* 8.9* 8.1* 8.6* 7.0*  HCT 27.0* 27.3* 24.9* 27.5* 21.9*  MCV 95.7 93.2 95.0 96.8 95.2  PLT 144* 153 165 177 148*   Basic Metabolic Panel: Recent Labs  Lab 07/23/22 2156 07/24/22 0443 07/25/22 0442 07/25/22 1758 07/26/22 0528  NA 132* 136 136 138  --   K 3.6 3.1* 3.8 4.0  --   CL 97* 96* 99 98  --   CO2 25 30 29 28   --   GLUCOSE 185* 159* 165* 240*  --   BUN 13 13 19  28*  --  CREATININE 0.51* 0.54* 0.58* 1.00 0.84  CALCIUM 8.3* 8.3* 8.1* 8.5*  --   MG 2.1  --   --   --   --    GFR: Estimated Creatinine Clearance: 75.3 mL/min (by C-G formula based on SCr of 0.84 mg/dL). Liver Function Tests: Recent Labs  Lab 07/23/22 2156 07/24/22 0443 07/25/22 1758  AST 21 13* 25  ALT 14 12 19   ALKPHOS 47 41 69  BILITOT 0.8 0.8 0.6  PROT 5.7* 5.6* 5.6*  ALBUMIN 2.5* 2.5* 2.2*   No results for input(s): "LIPASE", "AMYLASE" in the last 168 hours. No results for input(s): "AMMONIA" in the last 168 hours. Coagulation Profile: No results for input(s): "INR", "PROTIME" in the last 168 hours. Cardiac Enzymes: No results for input(s): "CKTOTAL", "CKMB",  "CKMBINDEX", "TROPONINI" in the last 168 hours. BNP (last 3 results) No results for input(s): "PROBNP" in the last 8760 hours. HbA1C: No results for input(s): "HGBA1C" in the last 72 hours. CBG: Recent Labs  Lab 07/25/22 1638 07/25/22 2109 07/25/22 2310 07/26/22 0352 07/26/22 0738  GLUCAP 196* 175* 164* 143* 125*   Lipid Profile: No results for input(s): "CHOL", "HDL", "LDLCALC", "TRIG", "CHOLHDL", "LDLDIRECT" in the last 72 hours. Thyroid Function Tests: No results for input(s): "TSH", "T4TOTAL", "FREET4", "T3FREE", "THYROIDAB" in the last 72 hours. Anemia Panel: No results for input(s): "VITAMINB12", "FOLATE", "FERRITIN", "TIBC", "IRON", "RETICCTPCT" in the last 72 hours.  Sepsis Labs: Recent Labs  Lab 07/25/22 1758 07/25/22 2030 07/26/22 0528  PROCALCITON 0.64  --  5.69  LATICACIDVEN 3.3* 2.0* 1.5    Recent Results (from the past 240 hour(s))  Culture, blood (Routine X 2) w Reflex to ID Panel     Status: None (Preliminary result)   Collection Time: 07/25/22  5:58 PM   Specimen: BLOOD  Result Value Ref Range Status   Specimen Description BLOOD RIGHT ANTECUBITAL  Final   Special Requests   Final    BOTTLES DRAWN AEROBIC AND ANAEROBIC Blood Culture adequate volume   Culture   Final    NO GROWTH 4 DAYS Performed at [facility], [address], [place], [place] [address]    Report Status PENDING  Incomplete  Culture, blood (Routine X 2) w Reflex to ID Panel     Status: None (Preliminary result)   Collection Time: 07/25/22  6:10 PM   Specimen: BLOOD  Result Value Ref Range Status   Specimen Description BLOOD LEFT ANTECUBITAL  Final   Special Requests   Final    BOTTLES DRAWN AEROBIC AND ANAEROBIC Blood Culture adequate volume   Culture   Final    NO GROWTH 4 DAYS Performed at [facility], [address] [place], [place] [address]    Report Status PENDING  Incomplete         Radiology Studies: No results found.      Scheduled  Meds:  sodium chloride flush  3 mL Intravenous Q12H   Continuous Infusions:  morphine 5 mg/hr (07/28/22 2008)     LOS: 5 days    Time spent: 25  mins    Charise Killian, MD [facility] Pager [phone]  If 7PM-7AM, please contact night-coverage [email or web address] 07/29/2022, 7:40 AM

## 2022-07-29 NOTE — Plan of Care (Signed)
Patient has been accepted to hospice and will be discharged today.  PMT will sign off.

## 2022-07-29 NOTE — Care Management Important Message (Signed)
Important Message  Patient Details  Name: Daniel Mitchell MRN: [id number] Date of Birth: Nov 05, 1965   Medicare Important Message Given:  Other (see comment)  Disposition to transfer to hospice home pending bed availability.  Medicare IM withheld at this time out of respect for patient and family.    Johnell Comings 07/29/2022, 8:33 AM

## 2022-07-30 LAB — CULTURE, BLOOD (ROUTINE X 2)
Culture: NO GROWTH
Culture: NO GROWTH
Special Requests: ADEQUATE
Special Requests: ADEQUATE

## 2022-08-24 DEATH — deceased
